# Patient Record
Sex: Female | Born: 1955 | Race: White | Hispanic: No | Marital: Single | Smoking: Current every day smoker
Health system: Southern US, Community
[De-identification: ages and names within clinical notes are randomized; demographics above are authoritative.]

## PROBLEM LIST (undated history)

## (undated) DIAGNOSIS — K219 Gastro-esophageal reflux disease without esophagitis: Secondary | ICD-10-CM

## (undated) DIAGNOSIS — G4730 Sleep apnea, unspecified: Secondary | ICD-10-CM

## (undated) DIAGNOSIS — J449 Chronic obstructive pulmonary disease, unspecified: Secondary | ICD-10-CM

## (undated) DIAGNOSIS — F32A Depression, unspecified: Secondary | ICD-10-CM

## (undated) DIAGNOSIS — Z9884 Bariatric surgery status: Secondary | ICD-10-CM

## (undated) DIAGNOSIS — I2510 Atherosclerotic heart disease of native coronary artery without angina pectoris: Secondary | ICD-10-CM

## (undated) DIAGNOSIS — M48062 Spinal stenosis, lumbar region with neurogenic claudication: Secondary | ICD-10-CM

## (undated) DIAGNOSIS — G2581 Restless legs syndrome: Secondary | ICD-10-CM

## (undated) DIAGNOSIS — D649 Anemia, unspecified: Secondary | ICD-10-CM

## (undated) DIAGNOSIS — G709 Myoneural disorder, unspecified: Secondary | ICD-10-CM

## (undated) DIAGNOSIS — R42 Dizziness and giddiness: Secondary | ICD-10-CM

## (undated) DIAGNOSIS — F419 Anxiety disorder, unspecified: Secondary | ICD-10-CM

## (undated) DIAGNOSIS — T7840XA Allergy, unspecified, initial encounter: Secondary | ICD-10-CM

## (undated) DIAGNOSIS — M1990 Unspecified osteoarthritis, unspecified site: Secondary | ICD-10-CM

## (undated) DIAGNOSIS — Z972 Presence of dental prosthetic device (complete) (partial): Secondary | ICD-10-CM

## (undated) DIAGNOSIS — I10 Essential (primary) hypertension: Secondary | ICD-10-CM

## (undated) HISTORY — PX: GASTRIC BYPASS: SHX52

## (undated) HISTORY — DX: Depression, unspecified: F32.A

## (undated) HISTORY — DX: Myoneural disorder, unspecified: G70.9

## (undated) HISTORY — DX: Anemia, unspecified: D64.9

## (undated) HISTORY — DX: Sleep apnea, unspecified: G47.30

## (undated) HISTORY — PX: TONSILLECTOMY: SHX5217

## (undated) HISTORY — DX: Unspecified osteoarthritis, unspecified site: M19.90

## (undated) HISTORY — PX: TUBAL LIGATION: SHX77

## (undated) HISTORY — PX: FRACTURE SURGERY: SHX138

## (undated) HISTORY — DX: Anxiety disorder, unspecified: F41.9

## (undated) HISTORY — DX: Allergy, unspecified, initial encounter: T78.40XA

---

## 2005-01-21 ENCOUNTER — Ambulatory Visit: Payer: Self-pay | Admitting: Family Medicine

## 2011-07-03 ENCOUNTER — Ambulatory Visit: Payer: Self-pay | Admitting: Podiatry

## 2011-07-10 ENCOUNTER — Ambulatory Visit: Payer: Self-pay | Admitting: Podiatry

## 2011-11-06 ENCOUNTER — Ambulatory Visit: Payer: Self-pay | Admitting: Internal Medicine

## 2012-03-07 ENCOUNTER — Other Ambulatory Visit: Payer: Self-pay | Admitting: Gastroenterology

## 2012-03-07 DIAGNOSIS — R109 Unspecified abdominal pain: Secondary | ICD-10-CM

## 2012-03-07 DIAGNOSIS — R1110 Vomiting, unspecified: Secondary | ICD-10-CM

## 2012-03-12 ENCOUNTER — Encounter (HOSPITAL_COMMUNITY): Payer: Self-pay

## 2012-03-12 ENCOUNTER — Emergency Department (HOSPITAL_COMMUNITY): Payer: 59

## 2012-03-12 ENCOUNTER — Emergency Department (HOSPITAL_COMMUNITY)
Admission: EM | Admit: 2012-03-12 | Discharge: 2012-03-12 | Disposition: A | Discharge status: Home / Self Care | Payer: 59 | Attending: Emergency Medicine | Admitting: Emergency Medicine

## 2012-03-12 DIAGNOSIS — N390 Urinary tract infection, site not specified: Secondary | ICD-10-CM | POA: Insufficient documentation

## 2012-03-12 DIAGNOSIS — Z9884 Bariatric surgery status: Secondary | ICD-10-CM | POA: Insufficient documentation

## 2012-03-12 DIAGNOSIS — R112 Nausea with vomiting, unspecified: Secondary | ICD-10-CM

## 2012-03-12 DIAGNOSIS — R1011 Right upper quadrant pain: Secondary | ICD-10-CM | POA: Insufficient documentation

## 2012-03-12 LAB — URINALYSIS, ROUTINE W REFLEX MICROSCOPIC
Bilirubin Urine: NEGATIVE
Glucose, UA: NEGATIVE mg/dL
Hgb urine dipstick: NEGATIVE
Ketones, ur: NEGATIVE mg/dL
Nitrite: POSITIVE — AB
Protein, ur: NEGATIVE mg/dL
Specific Gravity, Urine: 1.023 (ref 1.005–1.030)
Urobilinogen, UA: 1 mg/dL (ref 0.0–1.0)
pH: 6.5 (ref 5.0–8.0)

## 2012-03-12 LAB — COMPREHENSIVE METABOLIC PANEL
ALT: 14 U/L (ref 0–35)
AST: 18 U/L (ref 0–37)
Albumin: 3.6 g/dL (ref 3.5–5.2)
Alkaline Phosphatase: 94 U/L (ref 39–117)
BUN: 11 mg/dL (ref 6–23)
CO2: 26 mEq/L (ref 19–32)
Calcium: 9 mg/dL (ref 8.4–10.5)
Chloride: 103 mEq/L (ref 96–112)
Creatinine, Ser: 0.62 mg/dL (ref 0.50–1.10)
GFR calc Af Amer: >90 mL/min (ref >90)
GFR calc non Af Amer: >90 mL/min (ref >90)
Glucose, Bld: 97 mg/dL (ref 70–99)
Potassium: 4 mEq/L (ref 3.5–5.1)
Sodium: 140 mEq/L (ref 135–145)
Total Bilirubin: 0.3 mg/dL (ref 0.3–1.2)
Total Protein: 6.7 g/dL (ref 6.0–8.3)

## 2012-03-12 LAB — CBC WITH DIFFERENTIAL/PLATELET
Basophils Absolute: 0 10*3/uL (ref 0.0–0.1)
Basophils Relative: 0 % (ref 0–1)
Eosinophils Absolute: 0.1 10*3/uL (ref 0.0–0.7)
Eosinophils Relative: 1 % (ref 0–5)
HCT: 38.8 % (ref 36.0–46.0)
Hemoglobin: 12.9 g/dL (ref 12.0–15.0)
Lymphocytes Relative: 21 % (ref 12–46)
Lymphs Abs: 1.9 10*3/uL (ref 0.7–4.0)
MCH: 28.8 pg (ref 26.0–34.0)
MCHC: 33.2 g/dL (ref 30.0–36.0)
MCV: 86.6 fL (ref 78.0–100.0)
Monocytes Absolute: 0.4 10*3/uL (ref 0.1–1.0)
Monocytes Relative: 5 % (ref 3–12)
Neutro Abs: 6.7 10*3/uL (ref 1.7–7.7)
Neutrophils Relative %: 73 % (ref 43–77)
Platelets: 248 10*3/uL (ref 150–400)
RBC: 4.48 MIL/uL (ref 3.87–5.11)
RDW: 14.3 % (ref 11.5–15.5)
WBC: 9.1 10*3/uL (ref 4.0–10.5)

## 2012-03-12 LAB — LIPASE, BLOOD: Lipase: 25 U/L (ref 11–59)

## 2012-03-12 LAB — URINE MICROSCOPIC-ADD ON

## 2012-03-12 MED ORDER — NITROFURANTOIN MONOHYD MACRO 100 MG PO CAPS: 100.0000 mg | ORAL_CAPSULE | Freq: Two times a day (BID) | ORAL | Status: AC

## 2012-03-12 MED ORDER — HYDROCODONE-ACETAMINOPHEN 5-325 MG PO TABS: 1.0000 | ORAL_TABLET | ORAL | Status: AC | PRN

## 2012-03-12 MED ORDER — ONDANSETRON 4 MG PO TBDP
8.0000 mg | ORAL_TABLET | Freq: Once | ORAL | Status: AC
  Administered 2012-03-12: 8 mg via ORAL
  Filled 2012-03-12: qty 2

## 2012-03-12 MED ORDER — ONDANSETRON 4 MG PO TBDP: 4.0000 mg | ORAL_TABLET | Freq: Three times a day (TID) | ORAL | Status: AC | PRN

## 2012-03-12 NOTE — ED Provider Notes (Signed)
History     CSN: [id number]  Arrival date & time 03/12/12  0945   First MD Initiated Contact with Patient 03/12/12 [phone]      Chief Complaint  Patient presents with  . Abdominal Pain    (Consider location/radiation/quality/duration/timing/severity/associated sxs/prior treatment) HPI History from patient. 56 year old female who is status post gastric bypass (Roux-en-Y), and cesarean section, presents with abdominal pain. She reports that she has had pain to the right upper quadrant for the past several months. She has been seen by both her PCP and Dr. Loreta Ave of GI for this. She has been scheduled to have both a HIDA scan as well as an abdominal ultrasound on Monday. She was instructed that should she develop vomiting over the weekend, she should proceed to the ED for further evaluation. Patient reports that she awoke this morning and has had approximately 6 episodes of emesis, and is now to the point where she is just "dry heaving." Last bowel movement was yesterday and was reduced in volume. She has been able to pass flatus. Denies fever or chills. She reports that the pain has not significantly worsened. It is described as sharp in nature without known aggravating/alleviating factors.  History reviewed. No pertinent past medical history.  Past Surgical History  Procedure Date  . Gastric bypass   . Cesarean section    bypass performed at [facility], 2011  No family history on file.  History  Substance Use Topics  . Smoking status: Current Everyday Smoker  . Smokeless tobacco: Not on file  . Alcohol Use: No    OB History    Grav Para Term Preterm Abortions TAB SAB Ect Mult Living                  Review of Systems  Constitutional: Positive for appetite change. Negative for fever and chills.  Respiratory: Negative for cough and shortness of breath.   Cardiovascular: Negative for chest pain and palpitations.  Gastrointestinal: Positive for nausea, vomiting and  abdominal pain. Negative for diarrhea, constipation, blood in stool, abdominal distention, anal bleeding and rectal pain.  Genitourinary: Negative for dysuria, vaginal bleeding and vaginal discharge.  All other systems reviewed and are negative.    Allergies  Codeine and Levaquin  Home Medications   Current Outpatient Rx  Name Route Sig Dispense Refill  . ERGOCALCIFEROL 50000 UNITS PO CAPS Oral Take 50,000 Units by mouth 2 (two) times a week.    . FUROSEMIDE 40 MG PO TABS Oral Take 40 mg by mouth 2 (two) times a week.    Marland Kitchen LORAZEPAM 0.5 MG PO TABS Oral Take 0.5 mg by mouth every 8 (eight) hours as needed. anxiety    . TEMAZEPAM 30 MG PO CAPS Oral Take 30 mg by mouth at bedtime.    . TRAMADOL HCL 50 MG PO TABS Oral Take 50 mg by mouth every 6 (six) hours as needed. pain      BP 124/84  Pulse 83  Temp 98.6 F (37 C) (Oral)  Resp 20  SpO2 96%  Physical Exam  Nursing note and vitals reviewed. Constitutional: She appears well-developed and well-nourished. No distress.  HENT:  Head: Normocephalic and atraumatic.  Mouth/Throat: Oropharynx is clear and moist.  Eyes:       Normal appearance  Neck: Normal range of motion.  Cardiovascular: Normal rate, regular rhythm and normal heart sounds.   Pulmonary/Chest: Effort normal and breath sounds normal. She exhibits no tenderness.  Abdominal: Soft. Bowel sounds are  normal.       Tender to palpation without guarding or rebound in the right upper quadrant. Slight generalized tenderness to palpation as well. Normal bowel sounds.  Musculoskeletal: Normal range of motion.  Neurological: She is alert.  Skin: Skin is warm and dry. She is not diaphoretic.  Psychiatric: She has a normal mood and affect.    ED Course  Procedures (including critical care time)  Labs Reviewed  URINALYSIS, ROUTINE W REFLEX MICROSCOPIC - Abnormal; Notable for the following:    APPearance CLOUDY (*)     Nitrite POSITIVE (*)     Leukocytes, UA TRACE (*)      All other components within normal limits  URINE MICROSCOPIC-ADD ON - Abnormal; Notable for the following:    Bacteria, UA MANY (*)     All other components within normal limits  CBC WITH DIFFERENTIAL  COMPREHENSIVE METABOLIC PANEL  LIPASE, BLOOD  URINE CULTURE   US Abdomen Complete  03/12/2012  *RADIOLOGY REPORT*  Clinical Data:  Right upper quadrant pain.  Nausea and vomiting. Previous gastric bypass surgery.  ABDOMINAL ULTRASOUND COMPLETE  Comparison:  None.  Findings:  Gallbladder:  No gallstones, gallbladder wall thickening, or pericholecystic fluid.  Common Bile Duct:  Within normal limits in caliber. Measures 5 mm in diameter.  Liver: No focal mass lesion identified.  Within normal limits in parenchymal echogenicity.  IVC:  Appears normal.  Pancreas:  No abnormality identified.  Spleen:  Within normal limits in size and echotexture.  Right kidney:  Normal in size and parenchymal echogenicity.  No evidence of mass or hydronephrosis.  Left kidney:  Normal in size and parenchymal echogenicity.  No evidence of mass or hydronephrosis.  Abdominal Aorta:  No aneurysm identified.  IMPRESSION: Negative abdominal ultrasound.  Original Report Authenticated By: Danae Orleans, M.D.     1. Urinary tract infection   2. Nausea and vomiting       MDM  Patient presents with nausea and vomiting which started acutely today, accompanied by several weeks to months worth of right upper quadrant pain. Ultrasound does not show evidence of gallbladder pathology or changes with the bile duct. Lab investigation is generally reassuring with the exception of urinary tract infection. This certainly could be the etiology of the patient's nausea and vomiting. She has no true CVA tenderness on exam. Findings discussed with the patient. She was instructed that she still needs to get the HIDA scan as scheduled on Monday. Prescriptions for antibiotic, pain, nausea medicine. Reasons to return  discussed.        Grant Fontana, PA-C 03/12/12 1316

## 2012-03-12 NOTE — ED Notes (Signed)
Pt has an appt for u/s on Monday morning to check gallbladder.  Pt instructed by GI MD if pt began vomiting to go ahead to the ED.

## 2012-03-13 NOTE — ED Provider Notes (Signed)
Medical screening examination/treatment/procedure(s) were performed by non-physician practitioner and as supervising physician I was immediately available for consultation/collaboration.  Klaira Pesci, MD 03/13/12 0719 

## 2012-03-14 ENCOUNTER — Inpatient Hospital Stay (HOSPITAL_COMMUNITY): Admission: RE | Admit: 2012-03-14 | Payer: Self-pay | Source: Ambulatory Visit

## 2012-03-14 ENCOUNTER — Encounter (HOSPITAL_COMMUNITY)
Admission: RE | Admit: 2012-03-14 | Discharge: 2012-03-14 | Disposition: A | Discharge status: Home / Self Care | Payer: 59 | Source: Ambulatory Visit | Attending: Gastroenterology | Admitting: Gastroenterology

## 2012-03-14 DIAGNOSIS — R1110 Vomiting, unspecified: Secondary | ICD-10-CM | POA: Insufficient documentation

## 2012-03-14 DIAGNOSIS — R109 Unspecified abdominal pain: Secondary | ICD-10-CM | POA: Insufficient documentation

## 2012-03-14 MED ORDER — SINCALIDE 5 MCG IJ SOLR
0.0200 ug/kg | Freq: Once | INTRAMUSCULAR | Status: AC
  Administered 2012-03-14: 1.71 ug via INTRAVENOUS

## 2012-03-14 MED ORDER — TECHNETIUM TC 99M MEBROFENIN IV KIT
5.0000 | PACK | Freq: Once | INTRAVENOUS | Status: AC | PRN
  Administered 2012-03-14: 5 via INTRAVENOUS

## 2012-03-14 MED ORDER — SINCALIDE 5 MCG IJ SOLR
INTRAMUSCULAR | Status: AC
  Administered 2012-03-14: 1.71 ug via INTRAVENOUS
  Filled 2012-03-14: qty 5

## 2012-03-15 LAB — URINE CULTURE: Colony Count: >=100000

## 2012-03-16 NOTE — ED Notes (Signed)
+   Urine Patient treated with Cipro-sensitive to same-chart appended per protocol MD. 

## 2013-01-31 ENCOUNTER — Emergency Department: Payer: Self-pay | Admitting: Internal Medicine

## 2014-01-06 ENCOUNTER — Emergency Department: Payer: Self-pay | Admitting: Emergency Medicine

## 2014-03-31 ENCOUNTER — Emergency Department: Payer: Self-pay | Admitting: Emergency Medicine

## 2014-05-21 ENCOUNTER — Emergency Department: Payer: Self-pay | Admitting: Emergency Medicine

## 2014-06-03 ENCOUNTER — Emergency Department: Payer: Self-pay | Admitting: Internal Medicine

## 2015-04-15 ENCOUNTER — Encounter: Payer: Self-pay | Admitting: *Deleted

## 2015-04-15 ENCOUNTER — Emergency Department
Admission: EM | Admit: 2015-04-15 | Discharge: 2015-04-15 | Disposition: A | Discharge status: Home / Self Care | Payer: PRIVATE HEALTH INSURANCE | Attending: Emergency Medicine | Admitting: Emergency Medicine

## 2015-04-15 ENCOUNTER — Emergency Department: Payer: PRIVATE HEALTH INSURANCE

## 2015-04-15 ENCOUNTER — Other Ambulatory Visit: Payer: Self-pay

## 2015-04-15 DIAGNOSIS — Z79899 Other long term (current) drug therapy: Secondary | ICD-10-CM | POA: Insufficient documentation

## 2015-04-15 DIAGNOSIS — I10 Essential (primary) hypertension: Secondary | ICD-10-CM | POA: Insufficient documentation

## 2015-04-15 DIAGNOSIS — M549 Dorsalgia, unspecified: Secondary | ICD-10-CM | POA: Diagnosis not present

## 2015-04-15 DIAGNOSIS — Z720 Tobacco use: Secondary | ICD-10-CM | POA: Insufficient documentation

## 2015-04-15 DIAGNOSIS — R109 Unspecified abdominal pain: Secondary | ICD-10-CM | POA: Diagnosis not present

## 2015-04-15 HISTORY — DX: Essential (primary) hypertension: I10

## 2015-04-15 LAB — COMPREHENSIVE METABOLIC PANEL
ALT: 15 U/L (ref 14–54)
AST: 17 U/L (ref 15–41)
Albumin: 3.9 g/dL (ref 3.5–5.0)
Alkaline Phosphatase: 99 U/L (ref 38–126)
Anion gap: 8 (ref 5–15)
BUN: 9 mg/dL (ref 6–20)
CO2: 28 mmol/L (ref 22–32)
Calcium: 8.8 mg/dL — ABNORMAL LOW (ref 8.9–10.3)
Chloride: 104 mmol/L (ref 101–111)
Creatinine, Ser: 0.6 mg/dL (ref 0.44–1.00)
GFR calc Af Amer: >60 mL/min (ref >60)
GFR calc non Af Amer: >60 mL/min (ref >60)
Glucose, Bld: 90 mg/dL (ref 65–99)
Potassium: 3.7 mmol/L (ref 3.5–5.1)
Sodium: 140 mmol/L (ref 135–145)
Total Bilirubin: 0.4 mg/dL (ref 0.3–1.2)
Total Protein: 7.1 g/dL (ref 6.5–8.1)

## 2015-04-15 LAB — CBC
HCT: 34.8 % — ABNORMAL LOW (ref 35.0–47.0)
Hemoglobin: 11.1 g/dL — ABNORMAL LOW (ref 12.0–16.0)
MCH: 25.5 pg — ABNORMAL LOW (ref 26.0–34.0)
MCHC: 32 g/dL (ref 32.0–36.0)
MCV: 79.8 fL — ABNORMAL LOW (ref 80.0–100.0)
Platelets: 266 10*3/uL (ref 150–440)
RBC: 4.36 MIL/uL (ref 3.80–5.20)
RDW: 16.6 % — ABNORMAL HIGH (ref 11.5–14.5)
WBC: 9.7 10*3/uL (ref 3.6–11.0)

## 2015-04-15 LAB — URINALYSIS COMPLETE WITH MICROSCOPIC (ARMC ONLY)
Bilirubin Urine: NEGATIVE
Glucose, UA: NEGATIVE mg/dL
Hgb urine dipstick: NEGATIVE
Ketones, ur: NEGATIVE mg/dL
Leukocytes, UA: NEGATIVE
Nitrite: NEGATIVE
Protein, ur: NEGATIVE mg/dL
RBC / HPF: NONE SEEN RBC/hpf (ref 0–5)
Specific Gravity, Urine: 1.004 — ABNORMAL LOW (ref 1.005–1.030)
pH: 7 (ref 5.0–8.0)

## 2015-04-15 LAB — LIPASE, BLOOD: Lipase: 23 U/L (ref 22–51)

## 2015-04-15 MED ORDER — TRAMADOL HCL 50 MG PO TABS: 50.0000 mg | ORAL_TABLET | Freq: Four times a day (QID) | ORAL | Status: AC | PRN

## 2015-04-15 MED ORDER — TRAMADOL HCL 50 MG PO TABS
100.0000 mg | ORAL_TABLET | Freq: Once | ORAL | Status: AC
  Administered 2015-04-15: 100 mg via ORAL
  Filled 2015-04-15: qty 2

## 2015-04-15 NOTE — Discharge Instructions (Signed)
Abdominal Pain, Women °Abdominal (stomach, pelvic, or belly) pain can be caused by many things. It is important to tell your doctor: °· The location of the pain. °· Does it come and go or is it present all the time? °· Are there things that start the pain (eating certain foods, exercise)? °· Are there other symptoms associated with the pain (fever, nausea, vomiting, diarrhea)? °All of this is helpful to know when trying to find the cause of the pain. °CAUSES  °· Stomach: virus or bacteria infection, or ulcer. °· Intestine: appendicitis (inflamed appendix), regional ileitis (Crohn's disease), ulcerative colitis (inflamed colon), irritable bowel syndrome, diverticulitis (inflamed diverticulum of the colon), or cancer of the stomach or intestine. °· Gallbladder disease or stones in the gallbladder. °· Kidney disease, kidney stones, or infection. °· Pancreas infection or cancer. °· Fibromyalgia (pain disorder). °· Diseases of the female organs: °¨ Uterus: fibroid (non-cancerous) tumors or infection. °¨ Fallopian tubes: infection or tubal pregnancy. °¨ Ovary: cysts or tumors. °¨ Pelvic adhesions (scar tissue). °¨ Endometriosis (uterus lining tissue growing in the pelvis and on the pelvic organs). °¨ Pelvic congestion syndrome (female organs filling up with blood just before the menstrual period). °¨ Pain with the menstrual period. °¨ Pain with ovulation (producing an egg). °¨ Pain with an IUD (intrauterine device, birth control) in the uterus. °¨ Cancer of the female organs. °· Functional pain (pain not caused by a disease, may improve without treatment). °· Psychological pain. °· Depression. °DIAGNOSIS  °Your doctor will decide the seriousness of your pain by doing an examination. °· Blood tests. °· X-rays. °· Ultrasound. °· CT scan (computed tomography, special type of X-ray). °· MRI (magnetic resonance imaging). °· Cultures, for infection. °· Barium enema (dye inserted in the large intestine, to better view it with  X-rays). °· Colonoscopy (looking in intestine with a lighted tube). °· Laparoscopy (minor surgery, looking in abdomen with a lighted tube). °· Major abdominal exploratory surgery (looking in abdomen with a large incision). °TREATMENT  °The treatment will depend on the cause of the pain.  °· Many cases can be observed and treated at home. °· Over-the-counter medicines recommended by your caregiver. °· Prescription medicine. °· Antibiotics, for infection. °· Birth control pills, for painful periods or for ovulation pain. °· Hormone treatment, for endometriosis. °· Nerve blocking injections. °· Physical therapy. °· Antidepressants. °· Counseling with a psychologist or psychiatrist. °· Minor or major surgery. °HOME CARE INSTRUCTIONS  °· Do not take laxatives, unless directed by your caregiver. °· Take over-the-counter pain medicine only if ordered by your caregiver. Do not take aspirin because it can cause an upset stomach or bleeding. °· Try a clear liquid diet (broth or water) as ordered by your caregiver. Slowly move to a bland diet, as tolerated, if the pain is related to the stomach or intestine. °· Have a thermometer and take your temperature several times a day, and record it. °· Bed rest and sleep, if it helps the pain. °· Avoid sexual intercourse, if it causes pain. °· Avoid stressful situations. °· Keep your follow-up appointments and tests, as your caregiver orders. °· If the pain does not go away with medicine or surgery, you may try: °¨ Acupuncture. °¨ Relaxation exercises (yoga, meditation). °¨ Group therapy. °¨ Counseling. °SEEK MEDICAL CARE IF:  °· You notice certain foods cause stomach pain. °· Your home care treatment is not helping your pain. °· You need stronger pain medicine. °· You want your IUD removed. °· You feel faint or   lightheaded. °· You develop nausea and vomiting. °· You develop a rash. °· You are having side effects or an allergy to your medicine. °SEEK IMMEDIATE MEDICAL CARE IF:  °· Your  pain does not go away or gets worse. °· You have a fever. °· Your pain is felt only in portions of the abdomen. The right side could possibly be appendicitis. The left lower portion of the abdomen could be colitis or diverticulitis. °· You are passing blood in your stools (bright red or black tarry stools, with or without vomiting). °· You have blood in your urine. °· You develop chills, with or without a fever. °· You pass out. °MAKE SURE YOU:  °· Understand these instructions. °· Will watch your condition. °· Will get help right away if you are not doing well or get worse. °Document Released: 06/14/2007 Document Revised: 01/01/2014 Document Reviewed: 07/04/2009 °[employer]® Patient Information ©2015 [employer]. This information is not intended to replace advice given to you by your health care provider. Make sure you discuss any questions you have with your health care provider. ° °

## 2015-04-15 NOTE — ED Notes (Addendum)
Pt reports left sided abdominal pain lower than belly button over the last week with increased pain today, pt denies nausea vomiting, pt complains of left sided back pain

## 2015-04-15 NOTE — ED Provider Notes (Signed)
[facility] Emergency Department Provider Note  Time seen: 2:39 PM  I have reviewed the triage vital signs and the nursing notes.   HISTORY  Chief Complaint Abdominal Pain    HPI Cassandra Brandt is a 59 y.o. female who presents the emergency department with left-sided abdominal pain. According to the patient for the past 4 months she has had left-sided abdominal pain, that has been coming and going, states is much more constant for the past 1 week and is also increased in severity. Currently the patient states it is moderate 7/10, dull/aching in severity. Patient denies any diarrhea, nausea, vomiting, dysuria or hematuria. States normal bowel movements. Saw her primary care doctor for the same back in May, no workup was performed at that time. Patient states it does seem to be somewhat positional, and feels better when she puts pressure on her lower back.     Past Medical History  Diagnosis Date  . Hypertension     There are no active problems to display for this patient.   Past Surgical History  Procedure Laterality Date  . Gastric bypass    . Cesarean section      Current Outpatient Rx  Name  Route  Sig  Dispense  Refill  . ergocalciferol (VITAMIN D2) 50000 UNITS capsule   Oral   Take 50,000 Units by mouth 2 (two) times a week.         . furosemide (LASIX) 40 MG tablet   Oral   Take 40 mg by mouth 2 (two) times a week.         Marland Kitchen LORazepam (ATIVAN) 0.5 MG tablet   Oral   Take 0.5 mg by mouth every 8 (eight) hours as needed. anxiety         . temazepam (RESTORIL) 30 MG capsule   Oral   Take 30 mg by mouth at bedtime.         . traMADol (ULTRAM) 50 MG tablet   Oral   Take 50 mg by mouth every 6 (six) hours as needed. pain           Allergies Codeine and Levaquin  No family history on file.  Social History Social History  Substance Use Topics  . Smoking status: Current Every Day Smoker -- 0.50 packs/day  . Smokeless  tobacco: None  . Alcohol Use: No    Review of Systems Constitutional: Negative for fever. Cardiovascular: Negative for chest pain. Respiratory: Negative for shortness of breath. Gastrointestinal: Positive for left flank pain. Negative for nausea, vomiting, diarrhea. Genitourinary: Negative for dysuria. Negative for hematuria. Musculoskeletal: Positive for left back pain. Neurological: Negative for headache 10-point ROS otherwise negative.  ____________________________________________   PHYSICAL EXAM:  VITAL SIGNS: ED Triage Vitals  Enc Vitals Group     BP 04/15/15 1324 139/83 mmHg     Pulse Rate 04/15/15 1324 97     Resp 04/15/15 1324 18     Temp 04/15/15 1324 98.4 F (36.9 C)     Temp Source 04/15/15 1324 Oral     SpO2 04/15/15 1324 99 %     Weight 04/15/15 1324 198 lb (89.812 kg)     Height 04/15/15 1324  (1.626 m)     Head Cir --      Peak Flow --      Pain Score 04/15/15 1324 8     Pain Loc --      Pain Edu? --      Excl.  in GC? --     Constitutional: Alert and oriented. Well appearing and in no distress. Eyes: Normal exam ENT   Mouth/Throat: Mucous membranes are moist. Cardiovascular: Normal rate, regular rhythm. No murmur Respiratory: Normal respiratory effort without tachypnea nor retractions. Breath sounds are clear and equal bilaterally. No wheezes/rales/rhonchi. Gastrointestinal: Soft, moderate left abdominal tenderness palpation. No rebound or guarding. Moderate left CVA tenderness to palpation. No distention. Musculoskeletal: Nontender with normal range of motion in all extremities. No lower extremity tenderness or edema. Neurologic:  Normal speech and language. No gross focal neurologic deficits  Skin:  Skin is warm, dry and intact.  Psychiatric: Mood and affect are normal. Speech and behavior are normal. Patient exhibits appropriate insight and judgment.  ____________________________________________    EKG  EKG reviewed and interpreted by  myself shows normal sinus rhythm at 88 bpm, narrow QRS, normal axis, normal intervals, no ST changes noted. 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  ____________________________________________    RADIOLOGY  CT within normal limits  ____________________________________________    INITIAL IMPRESSION / ASSESSMENT AND PLAN / ED COURSE  Pertinent labs & imaging results that were available during my care of the patient were reviewed by me and considered in my medical decision making (see chart for details).  Patient with several months of left flank pain, now more constant for the past one week. Denies diarrhea, constipation, nausea/vomiting, dysuria. Denies fever. Labs are within normal limits including urinalysis, we'll proceed with a CT scan of the abdomen and pelvis to further evaluate.  CT abdomen/pelvis within normal limits. We'll discharge patient home with Ultram as needed, follow up with her primary care doctor. Discussed strict abdominal pain return precautions which she is agreeable.  ____________________________________________   FINAL CLINICAL IMPRESSION(S) / ED DIAGNOSES  Left flank pain   Minna Antis, MD 04/15/15 [phone]

## 2015-07-30 ENCOUNTER — Emergency Department
Admission: EM | Admit: 2015-07-30 | Discharge: 2015-07-30 | Disposition: A | Discharge status: Home / Self Care | Payer: Managed Care, Other (non HMO) | Attending: Emergency Medicine | Admitting: Emergency Medicine

## 2015-07-30 ENCOUNTER — Encounter: Payer: Self-pay | Admitting: Emergency Medicine

## 2015-07-30 ENCOUNTER — Emergency Department: Payer: Managed Care, Other (non HMO)

## 2015-07-30 DIAGNOSIS — S161XXA Strain of muscle, fascia and tendon at neck level, initial encounter: Secondary | ICD-10-CM | POA: Diagnosis not present

## 2015-07-30 DIAGNOSIS — W2209XA Striking against other stationary object, initial encounter: Secondary | ICD-10-CM | POA: Insufficient documentation

## 2015-07-30 DIAGNOSIS — S199XXA Unspecified injury of neck, initial encounter: Secondary | ICD-10-CM | POA: Diagnosis present

## 2015-07-30 DIAGNOSIS — Y9389 Activity, other specified: Secondary | ICD-10-CM | POA: Diagnosis not present

## 2015-07-30 DIAGNOSIS — Y9289 Other specified places as the place of occurrence of the external cause: Secondary | ICD-10-CM | POA: Insufficient documentation

## 2015-07-30 DIAGNOSIS — I10 Essential (primary) hypertension: Secondary | ICD-10-CM | POA: Diagnosis not present

## 2015-07-30 DIAGNOSIS — F17200 Nicotine dependence, unspecified, uncomplicated: Secondary | ICD-10-CM | POA: Diagnosis not present

## 2015-07-30 DIAGNOSIS — Y998 Other external cause status: Secondary | ICD-10-CM | POA: Insufficient documentation

## 2015-07-30 DIAGNOSIS — Z79899 Other long term (current) drug therapy: Secondary | ICD-10-CM | POA: Diagnosis not present

## 2015-07-30 MED ORDER — METHOCARBAMOL 500 MG PO TABS: 500.0000 mg | ORAL_TABLET | Freq: Four times a day (QID) | ORAL | Status: DC | PRN

## 2015-07-30 MED ORDER — MELOXICAM 15 MG PO TABS: 15.0000 mg | ORAL_TABLET | Freq: Every day | ORAL | Status: DC

## 2015-07-30 NOTE — Discharge Instructions (Signed)
Cervical Sprain  A cervical sprain is an injury in the neck in which the strong, fibrous tissues (ligaments) that connect your neck bones stretch or tear. Cervical sprains can range from mild to severe. Severe cervical sprains can cause the neck vertebrae to be unstable. This can lead to damage of the spinal cord and can result in serious nervous system problems. The amount of time it takes for a cervical sprain to get better depends on the cause and extent of the injury. Most cervical sprains heal in 1 to 3 weeks.  CAUSES   Severe cervical sprains may be caused by:    Contact sport injuries (such as from football, rugby, wrestling, hockey, auto racing, gymnastics, diving, martial arts, or boxing).    Motor vehicle collisions.    Whiplash injuries. This is an injury from a sudden forward and backward whipping movement of the head and neck.   Falls.   Mild cervical sprains may be caused by:    Being in an awkward position, such as while cradling a telephone between your ear and shoulder.    Sitting in a chair that does not offer proper support.    Working at a poorly designed computer station.    Looking up or down for long periods of time.   SYMPTOMS    Pain, soreness, stiffness, or a burning sensation in the front, back, or sides of the neck. This discomfort may develop immediately after the injury or slowly, 24 hours or more after the injury.    Pain or tenderness directly in the middle of the back of the neck.    Shoulder or upper back pain.    Limited ability to move the neck.    Headache.    Dizziness.    Weakness, numbness, or tingling in the hands or arms.    Muscle spasms.    Difficulty swallowing or chewing.    Tenderness and swelling of the neck.   DIAGNOSIS   Most of the time your health care provider can diagnose a cervical sprain by taking your history and doing a physical exam. Your health care provider will ask about previous neck injuries and any known neck  problems, such as arthritis in the neck. X-rays may be taken to find out if there are any other problems, such as with the bones of the neck. Other tests, such as a CT scan or MRI, may also be needed.   TREATMENT   Treatment depends on the severity of the cervical sprain. Mild sprains can be treated with rest, keeping the neck in place (immobilization), and pain medicines. Severe cervical sprains are immediately immobilized. Further treatment is done to help with pain, muscle spasms, and other symptoms and may include:   Medicines, such as pain relievers, numbing medicines, or muscle relaxants.    Physical therapy. This may involve stretching exercises, strengthening exercises, and posture training. Exercises and improved posture can help stabilize the neck, strengthen muscles, and help stop symptoms from returning.   HOME CARE INSTRUCTIONS    Put ice on the injured area.     Put ice in a plastic bag.     Place a towel between your skin and the bag.     Leave the ice on for 15-20 minutes, 3-4 times a day.    If your injury was severe, you may have been given a cervical collar to wear. A cervical collar is a two-piece collar designed to keep your neck from moving while it heals.      Do not remove the collar unless instructed by your health care provider.    If you have long hair, keep it outside of the collar.    Ask your health care provider before making any adjustments to your collar. Minor adjustments may be required over time to improve comfort and reduce pressure on your chin or on the back of your head.    Ifyou are allowed to remove the collar for cleaning or bathing, follow your health care provider's instructions on how to do so safely.    Keep your collar clean by wiping it with mild soap and water and drying it completely. If the collar you have been given includes removable pads, remove them every 1-2 days and hand wash them with soap and water. Allow them to air dry. They should be completely  dry before you wear them in the collar.    If you are allowed to remove the collar for cleaning and bathing, wash and dry the skin of your neck. Check your skin for irritation or sores. If you see any, tell your health care provider.    Do not drive while wearing the collar.    Only take over-the-counter or prescription medicines for pain, discomfort, or fever as directed by your health care provider.    Keep all follow-up appointments as directed by your health care provider.    Keep all physical therapy appointments as directed by your health care provider.    Make any needed adjustments to your workstation to promote good posture.    Avoid positions and activities that make your symptoms worse.    Warm up and stretch before being active to help prevent problems.   SEEK MEDICAL CARE IF:    Your pain is not controlled with medicine.    You are unable to decrease your pain medicine over time as planned.    Your activity level is not improving as expected.   SEEK IMMEDIATE MEDICAL CARE IF:    You develop any bleeding.   You develop stomach upset.   You have signs of an allergic reaction to your medicine.    Your symptoms get worse.    You develop new, unexplained symptoms.    You have numbness, tingling, weakness, or paralysis in any part of your body.   MAKE SURE YOU:    Understand these instructions.   Will watch your condition.   Will get help right away if you are not doing well or get worse.     This information is not intended to replace advice given to you by your health care provider. Make sure you discuss any questions you have with your health care provider.     Document Released: 06/14/2007 Document Revised: 08/22/2013 Document Reviewed: 02/22/2013  [employer]vier Interactive Patient Education 2016 [employer]
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
## 2015-07-30 NOTE — ED Notes (Signed)
Patient transported to CT 
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
## 2016-01-31 LAB — HM MAMMOGRAPHY

## 2016-03-21 ENCOUNTER — Emergency Department
Admission: EM | Admit: 2016-03-21 | Discharge: 2016-03-21 | Disposition: A | Discharge status: Home / Self Care | Payer: Managed Care, Other (non HMO) | Attending: Emergency Medicine | Admitting: Emergency Medicine

## 2016-03-21 ENCOUNTER — Encounter: Payer: Self-pay | Admitting: Emergency Medicine

## 2016-03-21 DIAGNOSIS — K118 Other diseases of salivary glands: Secondary | ICD-10-CM | POA: Insufficient documentation

## 2016-03-21 DIAGNOSIS — I10 Essential (primary) hypertension: Secondary | ICD-10-CM | POA: Insufficient documentation

## 2016-03-21 DIAGNOSIS — Z79899 Other long term (current) drug therapy: Secondary | ICD-10-CM | POA: Insufficient documentation

## 2016-03-21 DIAGNOSIS — F17200 Nicotine dependence, unspecified, uncomplicated: Secondary | ICD-10-CM | POA: Diagnosis not present

## 2016-03-21 DIAGNOSIS — R220 Localized swelling, mass and lump, head: Secondary | ICD-10-CM | POA: Diagnosis present

## 2016-03-21 MED ORDER — CEPHALEXIN 500 MG PO CAPS: 500.0000 mg | ORAL_CAPSULE | Freq: Four times a day (QID) | ORAL | Status: AC

## 2016-03-21 NOTE — ED Notes (Signed)
Pt presents to ED with reports of left side facial swelling that she noticed this morning. Pt states her mouth is very dry. Pt states she has a slight sore throat. Pt denies fever.

## 2016-03-21 NOTE — ED Provider Notes (Signed)
[facility] Provider Note  ____________________________________________  Time seen: Approximately 3:40 PM  I have reviewed the triage vital signs and the nursing notes.   HISTORY  Chief Complaint Facial Swelling   HPI JULIAUNA STUEVE is a 60 y.o. female presents to the emergency department for evaluation of left side jaw swelling. She stated that upon awakening this morning she felt "normal except having a dry mouth." She states this afternoon while lying down to watch a movie, she noticed that she had some swelling and tenderness to the left lower jaw/upper neck. She denies changes in medications or additions to medications. She denies a previous history of similar symptoms. She states that she has had some sinus drainage but otherwise has been healthy in the recent weeks. She states that she called her physician who is out of town and was advised to come to the emergency department for evaluation. She has not taken any type medication for her current symptoms.  Past Medical History  Diagnosis Date  . Hypertension     There are no active problems to display for this patient.   Past Surgical History  Procedure Laterality Date  . Gastric bypass    . Cesarean section      Current Outpatient Rx  Name  Route  Sig  Dispense  Refill  . cephALEXin (KEFLEX) 500 MG capsule   Oral   Take 1 capsule (500 mg total) by mouth 4 (four) times daily.   40 capsule   0   . ergocalciferol (VITAMIN D2) 50000 UNITS capsule   Oral   Take 50,000 Units by mouth 2 (two) times a week.         . furosemide (LASIX) 40 MG tablet   Oral   Take 40 mg by mouth 2 (two) times a week.         Marland Kitchen LORazepam (ATIVAN) 0.5 MG tablet   Oral   Take 0.5 mg by mouth every 8 (eight) hours as needed. anxiety         . meloxicam (MOBIC) 15 MG tablet   Oral   Take 1 tablet (15 mg total) by mouth daily.   30 tablet   0   . methocarbamol (ROBAXIN) 500 MG tablet  Oral   Take 1 tablet (500 mg total) by mouth every 6 (six) hours as needed for muscle spasms.   30 tablet   0   . temazepam (RESTORIL) 30 MG capsule   Oral   Take 30 mg by mouth at bedtime.         . traMADol (ULTRAM) 50 MG tablet   Oral   Take 1 tablet (50 mg total) by mouth every 6 (six) hours as needed.   20 tablet   0     Allergies Codeine and Levaquin  No family history on file.  Social History Social History  Substance Use Topics  . Smoking status: Current Every Day Smoker -- 0.50 packs/day  . Smokeless tobacco: None  . Alcohol Use: No    Review of Systems  Constitutional: Negative for fever/chills Respiratory: Negative for shortness of breath. ENT: Negative for sore throat, positive for dry mouth, negative for difficulty swallowing, negative for change in voice/hoarseness. Musculoskeletal: Negative for pain. Skin: Negative for wound, lesion, rash. Neurological: Negative for headaches, focal weakness or numbness. ____________________________________________   PHYSICAL EXAM:  VITAL SIGNS: ED Triage Vitals  Enc Vitals Group     BP 03/21/16 1506 120/68 mmHg  Pulse Rate 03/21/16 1506 95     Resp 03/21/16 1506 18     Temp 03/21/16 1506 98.2 F (36.8 C)     Temp src --      SpO2 03/21/16 1506 97 %     Weight 03/21/16 1506 177 lb (80.287 kg)     Height 03/21/16 1506 5\' 4"  (1.626 m)     Head Cir --      Peak Flow --      Pain Score 03/21/16 1505 0     Pain Loc --      Pain Edu? --      Excl. in GC? --      Constitutional: Alert and oriented. Well appearing and in no acute distress. Eyes: Conjunctivae are normal. EOMI. Nose: No congestion/rhinnorhea. Mouth/Throat: Mucous membranes are moist.  Top and bottom dentures. No gingival edema or erythema. Buccal area is soft and without induration. Sublingual area is soft and without induration. Neck: No stridor. Area over the parotid gland swollen. Tender to palpation. Oropharynx normal without erythema  or edema. Tonsils unremarkable. Thyroid unremarkable. Cardiovascular: Good peripheral circulation. Respiratory: Normal respiratory effort.  No retractions. Musculoskeletal: FROM throughout. Neurologic:  Normal speech and language. No gross focal neurologic deficits are appreciated. Skin:  Mild erythema of the left mandible/upper neck without erythema or fluctuance.  ____________________________________________   LABS (all labs ordered are listed, but only abnormal results are displayed)  Labs Reviewed - No data to display ____________________________________________  EKG   ____________________________________________  RADIOLOGY  Not indicated ____________________________________________   PROCEDURES  Procedure(s) performed: None ____________________________________________   INITIAL IMPRESSION / ASSESSMENT AND PLAN / ED COURSE  Pertinent labs & imaging results that were available during my care of the patient were reviewed by me and considered in my medical decision making (see chart for details).  She will be advised to eat lemon candies or tired foods. She was advised to stop any allergy medications that she might be taking. She will take cephalexin 500 mg 4 times a day for the next 10 days. She was advised to follow up with primary care in 2-3 days if not improving.  She was also advised to return to the emergency department for symptoms that change or worsen if unable to schedule an appointment.  ____________________________________________   FINAL CLINICAL IMPRESSION(S) / ED DIAGNOSES  Final diagnoses:  Parotid duct obstruction    New Prescriptions   CEPHALEXIN (KEFLEX) 500 MG CAPSULE    Take 1 capsule (500 mg total) by mouth 4 (four) times daily.     Chinita Pester, FNP 03/21/16 1608  Phineas Semen, MD 03/21/16 1843

## 2016-03-21 NOTE — Discharge Instructions (Signed)
Eat lemon or tart candies often. Avoid taking allergy medications until your symptoms improve.

## 2017-03-20 ENCOUNTER — Emergency Department
Admission: EM | Admit: 2017-03-20 | Discharge: 2017-03-20 | Disposition: A | Discharge status: Home / Self Care | Payer: PRIVATE HEALTH INSURANCE | Attending: Emergency Medicine | Admitting: Emergency Medicine

## 2017-03-20 ENCOUNTER — Encounter: Payer: Self-pay | Admitting: Medical Oncology

## 2017-03-20 DIAGNOSIS — R51 Headache: Secondary | ICD-10-CM | POA: Diagnosis not present

## 2017-03-20 DIAGNOSIS — Z5321 Procedure and treatment not carried out due to patient leaving prior to being seen by health care provider: Secondary | ICD-10-CM | POA: Diagnosis not present

## 2017-03-20 DIAGNOSIS — R112 Nausea with vomiting, unspecified: Secondary | ICD-10-CM | POA: Diagnosis not present

## 2017-03-20 HISTORY — DX: Gastro-esophageal reflux disease without esophagitis: K21.9

## 2017-03-20 LAB — CBC
HCT: 36.1 % (ref 35.0–47.0)
Hemoglobin: 11.7 g/dL — ABNORMAL LOW (ref 12.0–16.0)
MCH: 24.8 pg — ABNORMAL LOW (ref 26.0–34.0)
MCHC: 32.5 g/dL (ref 32.0–36.0)
MCV: 76.5 fL — ABNORMAL LOW (ref 80.0–100.0)
Platelets: 309 10*3/uL (ref 150–440)
RBC: 4.72 MIL/uL (ref 3.80–5.20)
RDW: 18.1 % — ABNORMAL HIGH (ref 11.5–14.5)
WBC: 10.9 10*3/uL (ref 3.6–11.0)

## 2017-03-20 LAB — COMPREHENSIVE METABOLIC PANEL
ALT: 16 U/L (ref 14–54)
AST: 21 U/L (ref 15–41)
Albumin: 4.1 g/dL (ref 3.5–5.0)
Alkaline Phosphatase: 100 U/L (ref 38–126)
Anion gap: 9 (ref 5–15)
BUN: 8 mg/dL (ref 6–20)
CO2: 24 mmol/L (ref 22–32)
Calcium: 9.2 mg/dL (ref 8.9–10.3)
Chloride: 108 mmol/L (ref 101–111)
Creatinine, Ser: 0.65 mg/dL (ref 0.44–1.00)
GFR calc Af Amer: >60 mL/min (ref >60)
GFR calc non Af Amer: >60 mL/min (ref >60)
Glucose, Bld: 98 mg/dL (ref 65–99)
Potassium: 3.4 mmol/L — ABNORMAL LOW (ref 3.5–5.1)
Sodium: 141 mmol/L (ref 135–145)
Total Bilirubin: 0.5 mg/dL (ref 0.3–1.2)
Total Protein: 7.6 g/dL (ref 6.5–8.1)

## 2017-03-20 NOTE — ED Triage Notes (Signed)
Pt reports that she began this am having headache without injury. Pt also reports nausea and vomiting since this am with the pain. Pt A/O x 4, in NAD at this time.

## 2017-03-20 NOTE — ED Notes (Signed)
Called x3 for room, no answer. 

## 2019-11-27 ENCOUNTER — Other Ambulatory Visit: Payer: Self-pay

## 2019-11-27 ENCOUNTER — Encounter: Payer: Self-pay | Admitting: Emergency Medicine

## 2019-11-27 ENCOUNTER — Emergency Department
Admission: EM | Admit: 2019-11-27 | Discharge: 2019-11-27 | Disposition: A | Discharge status: Home / Self Care | Payer: BC Managed Care – PPO | Attending: Emergency Medicine | Admitting: Emergency Medicine

## 2019-11-27 ENCOUNTER — Emergency Department: Payer: BC Managed Care – PPO

## 2019-11-27 DIAGNOSIS — F17210 Nicotine dependence, cigarettes, uncomplicated: Secondary | ICD-10-CM | POA: Insufficient documentation

## 2019-11-27 DIAGNOSIS — S99912A Unspecified injury of left ankle, initial encounter: Secondary | ICD-10-CM | POA: Diagnosis not present

## 2019-11-27 DIAGNOSIS — S93402A Sprain of unspecified ligament of left ankle, initial encounter: Secondary | ICD-10-CM | POA: Insufficient documentation

## 2019-11-27 DIAGNOSIS — S63501A Unspecified sprain of right wrist, initial encounter: Secondary | ICD-10-CM | POA: Insufficient documentation

## 2019-11-27 DIAGNOSIS — S6991XA Unspecified injury of right wrist, hand and finger(s), initial encounter: Secondary | ICD-10-CM | POA: Diagnosis present

## 2019-11-27 DIAGNOSIS — Y999 Unspecified external cause status: Secondary | ICD-10-CM | POA: Diagnosis not present

## 2019-11-27 DIAGNOSIS — W010XXA Fall on same level from slipping, tripping and stumbling without subsequent striking against object, initial encounter: Secondary | ICD-10-CM | POA: Insufficient documentation

## 2019-11-27 DIAGNOSIS — W19XXXA Unspecified fall, initial encounter: Secondary | ICD-10-CM

## 2019-11-27 DIAGNOSIS — Y9389 Activity, other specified: Secondary | ICD-10-CM | POA: Diagnosis not present

## 2019-11-27 DIAGNOSIS — Y929 Unspecified place or not applicable: Secondary | ICD-10-CM | POA: Diagnosis not present

## 2019-11-27 MED ORDER — MELOXICAM 15 MG PO TABS: 15.0000 mg | ORAL_TABLET | Freq: Every day | ORAL | 0 refills | Status: DC

## 2019-11-27 NOTE — ED Provider Notes (Signed)
[facility] Emergency Department Provider Note  ____________________________________________  Time seen: Approximately 5:44 PM  I have reviewed the triage vital signs and the nursing notes.   HISTORY  Chief Complaint Fall    HPI Cassandra Brandt is a 64 y.o. female who presents the emergency department for evaluation of right wrist and left ankle pain.  Patient states that she tripped on a rock this morning causing her foot to twist.  This twisting motion caused her to fall.  She tried to brace her fall with an extended right arm/hand and injured her right wrist as well.  Patient states that she did not believe she had her head or lost consciousness initially.  However she did develop a bruise to the right side of the chin.  She denies any facial pain.  No headaches, visual changes, neck pain, chest pain, shortness of breath no abdominal pain, nausea or vomiting.  Patient states that she is able to hobble on the left ankle but doing so increases the pain.  She is a not able to use or put pressure on the right wrist or hand.  Patient denies any numbness or tingling in the upper or lower extremities.  No back pain.  No medications for his complaint prior to arrival.         Past Medical History:  Diagnosis Date  . GERD (gastroesophageal reflux disease)   . Hypertension     There are no problems to display for this patient.   Past Surgical History:  Procedure Laterality Date  . CESAREAN SECTION    . GASTRIC BYPASS      Prior to Admission medications   Medication Sig Start Date End Date Taking? Authorizing Provider  ergocalciferol (VITAMIN D2) 50000 UNITS capsule Take 50,000 Units by mouth 2 (two) times a week.    [provider]  furosemide (LASIX) 40 MG tablet Take 40 mg by mouth 2 (two) times a week.    [provider]  LORazepam (ATIVAN) 0.5 MG tablet Take 0.5 mg by mouth every 8 (eight) hours as needed. anxiety    [provider]   meloxicam (MOBIC) 15 MG tablet Take 1 tablet (15 mg total) by mouth daily. 11/27/19   Tiara Maultsby, Delorise Royals, PA-C  temazepam (RESTORIL) 30 MG capsule Take 30 mg by mouth at bedtime.    [provider]    Allergies Codeine and Levaquin [levofloxacin in d5w]  History reviewed. No pertinent family history.  Social History Social History   Tobacco Use  . Smoking status: Current Every Day Smoker    Packs/day: 0.50  Substance Use Topics  . Alcohol use: No  . Drug use: No     Review of Systems  Constitutional: No fever/chills Eyes: No visual changes. No discharge ENT: No upper respiratory complaints. Cardiovascular: no chest pain. Respiratory: no cough. No SOB. Gastrointestinal: No abdominal pain.  No nausea, no vomiting.  No diarrhea.  No constipation. Musculoskeletal:R wrist and L ankle pain Skin: Negative for rash, abrasions, lacerations, ecchymosis. Neurological: Negative for headaches, focal weakness or numbness. 10-point ROS otherwise negative.  ____________________________________________   PHYSICAL EXAM:  VITAL SIGNS: ED Triage Vitals  Enc Vitals Group     BP 11/27/19 1641 102/84     Pulse Rate 11/27/19 1641 96     Resp 11/27/19 1641 16     Temp 11/27/19 1641 98.3 F (36.8 C)     Temp Source 11/27/19 1641 Oral     SpO2 11/27/19 1641 97 %  Weight 11/27/19 1642 189 lb (85.7 kg)     Height 11/27/19 1642 5\' 4"  (1.626 m)     Head Circumference --      Peak Flow --      Pain Score 11/27/19 1642 10     Pain Loc --      Pain Edu? --      Excl. in GC? --      Constitutional: Alert and oriented. Well appearing and in no acute distress. Eyes: Conjunctivae are normal. PERRL. EOMI. Head: Small bruise to the right chin.  This measures approximately 1 cm in diameter.  No tenderness to the underlying osseous structures.  No other visible signs of trauma to the head or face. ENT:      Ears:       Nose: No congestion/rhinnorhea.      Mouth/Throat: Mucous  membranes are moist.  Neck: No stridor.  No cervical spine tenderness to palpation.  Cardiovascular: Normal rate, regular rhythm. Normal S1 and S2.  Good peripheral circulation. Respiratory: Normal respiratory effort without tachypnea or retractions. Lungs CTAB. Good air entry to the bases with no decreased or absent breath sounds. Musculoskeletal: Full range of motion to all extremities. No gross deformities appreciated.  Visualization of the right wrist reveals mild edema but no deformity.  Patient is able to flex, extend the wrist, supinate and pronate the forearm.  Patient is very tender to palpation over the distal radius.  No palpable abnormality about the wrist.  Radial pulse intact.  Sensation intact all digits.  Capillary refill intact all digits.  Patient has edema noted about the left ankle.  Patient has diffuse tenderness radiating from the lateral malleolus through the anterior joint line into the proximal metatarsals. Neurologic:  Normal speech and language. No gross focal neurologic deficits are appreciated.  Skin:  Skin is warm, dry and intact. No rash noted. Psychiatric: Mood and affect are normal. Speech and behavior are normal. Patient exhibits appropriate insight and judgement.   ____________________________________________   LABS (all labs ordered are listed, but only abnormal results are displayed)  Labs Reviewed - No data to display ____________________________________________  EKG   ____________________________________________  RADIOLOGY I personally viewed and evaluated these images as part of my medical decision making, as well as reviewing the written report by the radiologist.  DG Wrist Complete Right  Result Date: 11/27/2019 CLINICAL DATA:  Tripped and fell, right wrist pain EXAM: RIGHT WRIST - COMPLETE 3+ VIEW COMPARISON:  None. FINDINGS: Frontal, oblique, and lateral views of the right wrist are obtained. No fracture, subluxation, or dislocation. Joint  spaces are well preserved. Mild negative ulnar variance. Soft tissues are normal. IMPRESSION: 1. No acute displaced fracture. Electronically Signed   By: 11/29/2019 M.D.   On: 11/27/2019 17:17   DG Ankle Complete Left  Result Date: 11/27/2019 CLINICAL DATA:  Tripped and fell, pain EXAM: LEFT ANKLE COMPLETE - 3+ VIEW COMPARISON:  A 115 FINDINGS: Frontal, oblique, and lateral views of the left ankle are obtained. No fracture, subluxation, or dislocation. Mild osteophyte formation of the tibiotalar joint. Mild joint space narrowing within the midfoot. Prominent calcaneal spurs. Mild diffuse soft tissue swelling. IMPRESSION: 1. Soft tissue edema.  No acute fracture. 2. Midfoot and hindfoot osteoarthritis. Electronically Signed   By: 11/29/2019 M.D.   On: 11/27/2019 17:18    ____________________________________________    PROCEDURES  Procedure(s) performed:    Procedures    Medications - No data to display   ____________________________________________  INITIAL IMPRESSION / ASSESSMENT AND PLAN / ED COURSE  Pertinent labs & imaging results that were available during my care of the patient were reviewed by me and considered in my medical decision making (see chart for details).  Review of the [place] CSRS was performed in accordance of the [place] prior to dispensing any controlled drugs.           Patient's diagnosis is consistent with fall, ankle sprain, wrist pain.  Patient presented to the emergency department complaining of sharp left ankle, right wrist pain.  Patient slipped and fell injuring these 2 extremities.  Patient was neurologically vascularly intact.  Imaging reveals no acute fractures or subluxations.  Patient will be treated with Velcro wrist splint, walking boot to the left ankle.  Patient has a prescription for Ultram sent into the pharmacy by her primary care provider.  I will provide anti-inflammatory for further symptom improvement.  Follow-up with orthopedics as  needed..   Patient is given ED precautions to return to the ED for any worsening or new symptoms.     ____________________________________________  FINAL CLINICAL IMPRESSION(S) / ED DIAGNOSES  Final diagnoses:  Fall, initial encounter  Sprain of right wrist, initial encounter  Sprain of left ankle, unspecified ligament, initial encounter      NEW MEDICATIONS STARTED DURING THIS VISIT:  ED Discharge Orders         Ordered    meloxicam (MOBIC) 15 MG tablet  Daily     11/27/19 1802              This chart was dictated using voice recognition software/Dragon. Despite best efforts to proofread, errors can occur which can change the meaning. Any change was purely unintentional.    Darletta Moll, PA-C 11/27/19 1806    Blake Divine, MD 11/28/19 [phone]

## 2019-11-27 NOTE — ED Triage Notes (Signed)
Pt tripped on rock and fell. No blood thinners.  Pain to left ankle and right wrist. Unable to use right hand to drive here r/t pain.  Cannot bear weight on ankle.  No LOC

## 2019-11-27 NOTE — ED Notes (Signed)
See triage note  Presents s/p fall this am  States she tripped on a rock this am  Twisted left ankle   And then right wrist   Good pulses   Swelling noted to left ankle

## 2020-07-25 IMAGING — DX DG WRIST COMPLETE 3+V*R*
4 series · 4 of 4 positions shown · non-contrast
Comparison: None.

CLINICAL DATA: Tripped and fell, right wrist pain

EXAM:
RIGHT WRIST - COMPLETE 3+ VIEW

[wrist ap (1 of 2)]
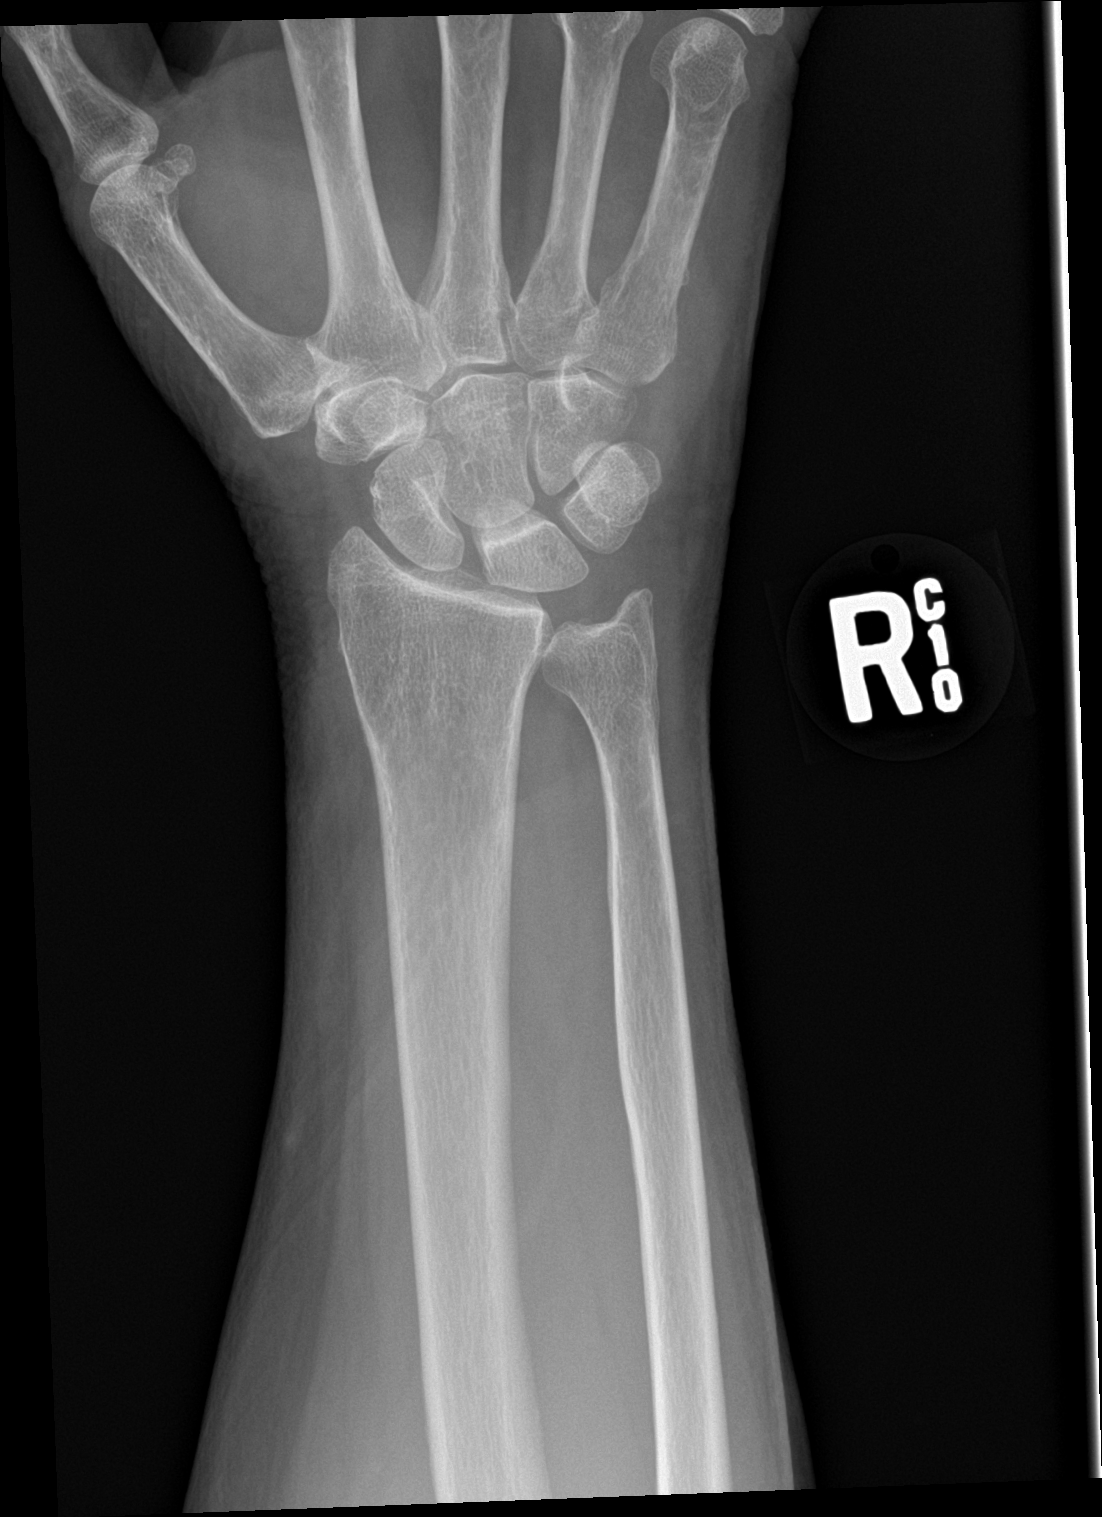

[wrist obl]
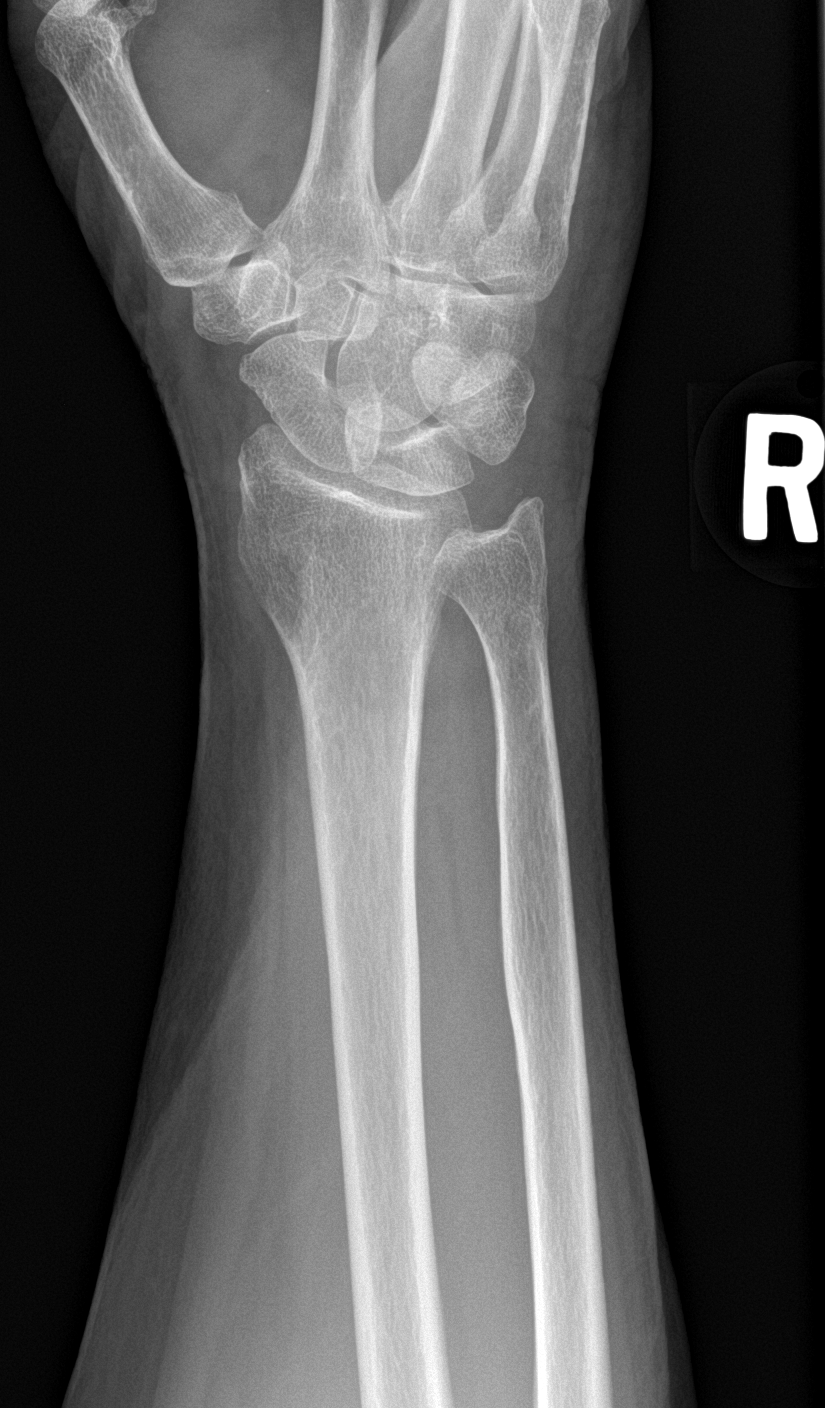

[wrist lat]
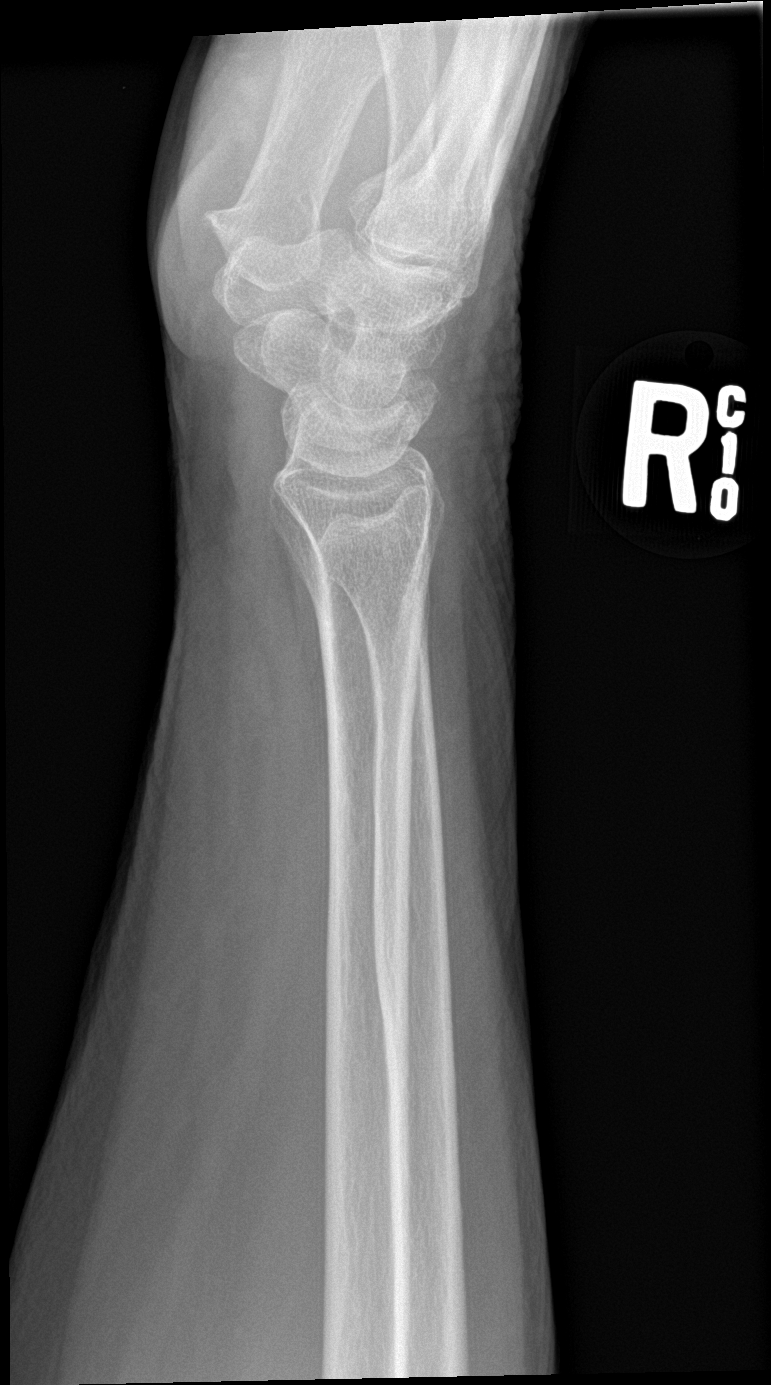

[wrist ap (2 of 2)]
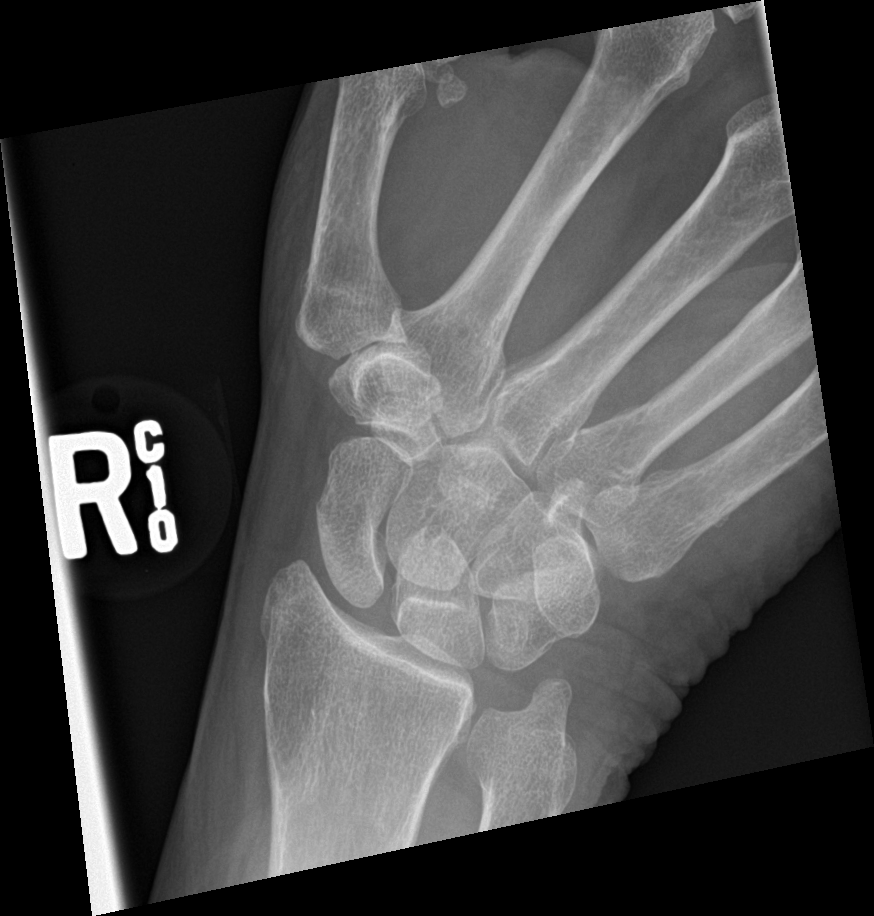

[4 of 4 positions shown; findings below may reference images not displayed]

FINDINGS: Frontal, oblique, and lateral views of the right wrist are obtained.
No fracture, subluxation, or dislocation. Joint spaces are well
preserved. Mild negative ulnar variance. Soft tissues are normal.
IMPRESSION: 1. No acute displaced fracture.

## 2020-10-28 ENCOUNTER — Telehealth: Payer: Self-pay

## 2020-10-28 NOTE — Telephone Encounter (Signed)
Copied from CRM [id number]. Topic: Appointment Scheduling - New Patient >> Oct 22, 2020 10:43 AM Daphine Deutscher D wrote: New patient has been scheduled for your office.  No   Provider: Larae Grooms  Pt has [facility] ppo  She needs a new provider  cb#  [phone]    UNABLE TO LVM TO SCHDULE AS VM NOT SET UP AND OTHER NUMBER DISCONNECTED.

## 2021-01-18 DIAGNOSIS — R350 Frequency of micturition: Secondary | ICD-10-CM | POA: Diagnosis not present

## 2021-01-18 DIAGNOSIS — N390 Urinary tract infection, site not specified: Secondary | ICD-10-CM | POA: Diagnosis not present

## 2021-01-20 DIAGNOSIS — Z833 Family history of diabetes mellitus: Secondary | ICD-10-CM | POA: Diagnosis not present

## 2021-01-20 DIAGNOSIS — Z1231 Encounter for screening mammogram for malignant neoplasm of breast: Secondary | ICD-10-CM | POA: Diagnosis not present

## 2021-01-20 DIAGNOSIS — Z13220 Encounter for screening for lipoid disorders: Secondary | ICD-10-CM | POA: Diagnosis not present

## 2021-01-20 DIAGNOSIS — K219 Gastro-esophageal reflux disease without esophagitis: Secondary | ICD-10-CM | POA: Diagnosis not present

## 2021-01-20 DIAGNOSIS — Z9884 Bariatric surgery status: Secondary | ICD-10-CM | POA: Diagnosis not present

## 2021-01-20 DIAGNOSIS — F5101 Primary insomnia: Secondary | ICD-10-CM | POA: Diagnosis not present

## 2021-01-21 DIAGNOSIS — F5101 Primary insomnia: Secondary | ICD-10-CM | POA: Insufficient documentation

## 2021-01-21 DIAGNOSIS — K219 Gastro-esophageal reflux disease without esophagitis: Secondary | ICD-10-CM | POA: Insufficient documentation

## 2021-01-21 DIAGNOSIS — G2581 Restless legs syndrome: Secondary | ICD-10-CM | POA: Insufficient documentation

## 2021-01-21 DIAGNOSIS — Z9884 Bariatric surgery status: Secondary | ICD-10-CM | POA: Insufficient documentation

## 2021-01-30 DIAGNOSIS — I10 Essential (primary) hypertension: Secondary | ICD-10-CM | POA: Insufficient documentation

## 2021-01-30 DIAGNOSIS — Z720 Tobacco use: Secondary | ICD-10-CM | POA: Insufficient documentation

## 2021-01-30 DIAGNOSIS — J449 Chronic obstructive pulmonary disease, unspecified: Secondary | ICD-10-CM | POA: Insufficient documentation

## 2021-01-30 DIAGNOSIS — F419 Anxiety disorder, unspecified: Secondary | ICD-10-CM | POA: Insufficient documentation

## 2021-01-30 DIAGNOSIS — M169 Osteoarthritis of hip, unspecified: Secondary | ICD-10-CM | POA: Insufficient documentation

## 2021-01-30 DIAGNOSIS — E559 Vitamin D deficiency, unspecified: Secondary | ICD-10-CM | POA: Insufficient documentation

## 2021-04-28 DIAGNOSIS — D508 Other iron deficiency anemias: Secondary | ICD-10-CM | POA: Diagnosis not present

## 2021-04-28 DIAGNOSIS — G2581 Restless legs syndrome: Secondary | ICD-10-CM | POA: Diagnosis not present

## 2021-04-28 DIAGNOSIS — D510 Vitamin B12 deficiency anemia due to intrinsic factor deficiency: Secondary | ICD-10-CM | POA: Diagnosis not present

## 2021-04-28 DIAGNOSIS — D509 Iron deficiency anemia, unspecified: Secondary | ICD-10-CM | POA: Insufficient documentation

## 2021-04-28 DIAGNOSIS — F5101 Primary insomnia: Secondary | ICD-10-CM | POA: Diagnosis not present

## 2021-12-19 DIAGNOSIS — G2581 Restless legs syndrome: Secondary | ICD-10-CM | POA: Diagnosis not present

## 2021-12-19 DIAGNOSIS — E538 Deficiency of other specified B group vitamins: Secondary | ICD-10-CM | POA: Diagnosis not present

## 2021-12-19 DIAGNOSIS — K219 Gastro-esophageal reflux disease without esophagitis: Secondary | ICD-10-CM | POA: Diagnosis not present

## 2021-12-19 DIAGNOSIS — D508 Other iron deficiency anemias: Secondary | ICD-10-CM | POA: Diagnosis not present

## 2021-12-19 DIAGNOSIS — G4719 Other hypersomnia: Secondary | ICD-10-CM | POA: Diagnosis not present

## 2021-12-26 DIAGNOSIS — D72825 Bandemia: Secondary | ICD-10-CM | POA: Diagnosis not present

## 2022-06-29 ENCOUNTER — Encounter: Payer: Self-pay | Admitting: Nurse Practitioner

## 2022-06-29 ENCOUNTER — Ambulatory Visit: Payer: BC Managed Care – PPO | Admitting: Nurse Practitioner

## 2022-06-29 VITALS — BP 142/78 | HR 95 | Temp 98.9°F | Ht 63.5 in | Wt 207.0 lb

## 2022-06-29 DIAGNOSIS — Z7689 Persons encountering health services in other specified circumstances: Secondary | ICD-10-CM

## 2022-06-29 DIAGNOSIS — F5101 Primary insomnia: Secondary | ICD-10-CM

## 2022-06-29 DIAGNOSIS — I10 Essential (primary) hypertension: Secondary | ICD-10-CM | POA: Diagnosis not present

## 2022-06-29 DIAGNOSIS — Z780 Asymptomatic menopausal state: Secondary | ICD-10-CM | POA: Diagnosis not present

## 2022-06-29 DIAGNOSIS — Z1231 Encounter for screening mammogram for malignant neoplasm of breast: Secondary | ICD-10-CM

## 2022-06-29 DIAGNOSIS — F419 Anxiety disorder, unspecified: Secondary | ICD-10-CM

## 2022-06-29 DIAGNOSIS — J449 Chronic obstructive pulmonary disease, unspecified: Secondary | ICD-10-CM

## 2022-06-29 MED ORDER — CITALOPRAM HYDROBROMIDE 10 MG PO TABS: 10.0000 mg | ORAL_TABLET | Freq: Every day | ORAL | 0 refills | Status: DC

## 2022-06-29 MED ORDER — TEMAZEPAM 30 MG PO CAPS: 30.0000 mg | ORAL_CAPSULE | Freq: Every day | ORAL | 0 refills | Status: DC

## 2022-06-29 NOTE — Assessment & Plan Note (Signed)
Chronic. Not well controlled.  Will start Celexa 10mg .  Side effects and benefits of medication discussed during visit.  Patient has been on this medication many years ago and did well with it.  Follow up in 2 weeks for reevaluation.  Call sooner if concerns arise.

## 2022-06-29 NOTE — Progress Notes (Unsigned)
BP (!) 142/78   Pulse 95   Temp 98.9 F (37.2 C) (Oral)   Ht 5' 3.5" (1.613 m)   Wt 207 lb (93.9 kg)   SpO2 95%   BMI 36.09 kg/m    Subjective:    Patient ID: Cassandra Brandt, female    DOB: 23-Mar-1956, 66 y.o.   MRN: [id number]  HPI: Cassandra Brandt is a 66 y.o. female  Chief Complaint  Patient presents with   Establish Care    Patient would like to discuss insomnia and panic attacks . States her most recent one was yesterday   Patient presents to clinic to establish care with new PCP.  Introduced to Publishing rights manager role and practice setting.  All questions answered.  Discussed provider/patient relationship and expectations.  Patient states she hasn't had a PCP for a long while.  She is retiring on December 31.  Patient reports a history of Hypertension, Anxiety, Depression, COPD- never been on medication.  Current everyday smoker.  Smokes about 1/2ppd for about 50 years. Has history of gastric by pass in 2005.   Patient states she took Lisinopril in the past but it made her lips swell up. She hasn't been on any medication for a long time.    Patient denies a history of: Elevated Cholesterol, Diabetes, Thyroid problems, Neurological problems, and Abdominal problems.   She was previously seeing a provider who was doing house calls and they were prescribing her Temazepam, ativan and previously on adderall and they were prescribing that as well.    INSOMNIA Duration: years Satisfied with sleep quality: no Difficulty falling asleep: no Difficulty staying asleep: yes Waking a few hours after sleep onset: yes Early morning awakenings: yes Daytime hypersomnolence: no Wakes feeling refreshed: no Good sleep hygiene: yes Apnea: yes Snoring:  unsure Depressed/anxious mood: yes Recent stress: yes Restless legs/nocturnal leg cramps: no Chronic pain/arthritis: yes History of sleep study: yes- 2005 Treatments attempted:  Trazodone and  Temazepam  DEPRESSION/ANXIETY Duration:uncontrolled Anxious mood: yes  Excessive worrying: yes Irritability: no  Sweating:  sometimes Nausea: no Palpitations: when she has a panic attack Hyperventilation: no Panic attacks: yes Agoraphobia: no  Obscessions/compulsions: no Depressed mood: yes    06/29/2022    3:39 PM  Depression screen PHQ 2/9  Decreased Interest 2  Down, Depressed, Hopeless 3  PHQ - 2 Score 5  Altered sleeping 3  Tired, decreased energy 2  Change in appetite 1  Feeling bad or failure about yourself  3  Trouble concentrating 1  Moving slowly or fidgety/restless 1  Suicidal thoughts 0  PHQ-9 Score 16  Difficult doing work/chores Not difficult at all   Anhedonia: no Weight changes: no Insomnia: yes hard to stay asleep  Hypersomnia: no Fatigue/loss of energy: yes Feelings of worthlessness: no Feelings of guilt: no Impaired concentration/indecisiveness: no Suicidal ideations: no  Crying spells: no Recent Stressors/Life Changes: yes   Relationship problems: no   Family stress: no     Financial stress: no    Job stress: yes    Recent death/loss: no     Active Ambulatory Problems    Diagnosis Date Noted   Anxiety 01/30/2021   Chronic obstructive pulmonary disease, unspecified (HCC) 01/30/2021   Essential hypertension 01/30/2021   Gastroesophageal reflux disease without esophagitis 01/21/2021   H/O gastric bypass 01/21/2021   Iron deficiency anemia 04/28/2021   Primary insomnia 01/21/2021   RLS (restless legs syndrome) 01/21/2021   Vitamin D deficiency 01/30/2021   Resolved Ambulatory Problems  Diagnosis Date Noted   No Resolved Ambulatory Problems   Past Medical History:  Diagnosis Date   GERD (gastroesophageal reflux disease)    Hypertension    Past Surgical History:  Procedure Laterality Date   CESAREAN SECTION     GASTRIC BYPASS     History reviewed. No pertinent family history.   Review of Systems  Psychiatric/Behavioral:   Positive for dysphoric mood and sleep disturbance. Negative for decreased concentration, self-injury and suicidal ideas. The patient is nervous/anxious.     Per HPI unless specifically indicated above     Objective:    BP (!) 142/78   Pulse 95   Temp 98.9 F (37.2 C) (Oral)   Ht 5' 3.5" (1.613 m)   Wt 207 lb (93.9 kg)   SpO2 95%   BMI 36.09 kg/m   Wt Readings from Last 3 Encounters:  06/29/22 207 lb (93.9 kg)  11/27/19 189 lb (85.7 kg)  03/20/17 177 lb (80.3 kg)    Physical Exam Vitals and nursing note reviewed.  Constitutional:      General: She is not in acute distress.    Appearance: Normal appearance. She is obese. She is not ill-appearing, toxic-appearing or diaphoretic.  HENT:     Head: Normocephalic.     Right Ear: External ear normal.     Left Ear: External ear normal.     Nose: Nose normal.     Mouth/Throat:     Mouth: Mucous membranes are moist.     Pharynx: Oropharynx is clear.  Eyes:     General:        Right eye: No discharge.        Left eye: No discharge.     Extraocular Movements: Extraocular movements intact.     Conjunctiva/sclera: Conjunctivae normal.     Pupils: Pupils are equal, round, and reactive to light.  Cardiovascular:     Rate and Rhythm: Normal rate and regular rhythm.     Heart sounds: No murmur heard. Pulmonary:     Effort: Pulmonary effort is normal. No respiratory distress.     Breath sounds: Normal breath sounds. No wheezing or rales.  Musculoskeletal:     Cervical back: Normal range of motion and neck supple.  Skin:    General: Skin is warm and dry.     Capillary Refill: Capillary refill takes less than 2 seconds.  Neurological:     General: No focal deficit present.     Mental Status: She is alert and oriented to person, place, and time. Mental status is at baseline.  Psychiatric:        Mood and Affect: Mood normal.        Behavior: Behavior normal.        Thought Content: Thought content normal.        Judgment:  Judgment normal.     Results for orders placed or performed during the hospital encounter of 03/20/17  Comprehensive metabolic panel  Result Value Ref Range   Sodium 141 135 - 145 mmol/L   Potassium 3.4 (L) 3.5 - 5.1 mmol/L   Chloride 108 101 - 111 mmol/L   CO2 24 22 - 32 mmol/L   Glucose, Bld 98 65 - 99 mg/dL   BUN 8 6 - 20 mg/dL   Creatinine, Ser 4.48 0.44 - 1.00 mg/dL   Calcium 9.2 8.9 - 18.5 mg/dL   Total Protein 7.6 6.5 - 8.1 g/dL   Albumin 4.1 3.5 - 5.0 g/dL   AST 21  15 - 41 U/L   ALT 16 14 - 54 U/L   Alkaline Phosphatase 100 38 - 126 U/L   Total Bilirubin 0.5 0.3 - 1.2 mg/dL   GFR calc non Af Amer >60 >60 mL/min   GFR calc Af Amer >60 >60 mL/min   Anion gap 9 5 - 15  CBC  Result Value Ref Range   WBC 10.9 3.6 - 11.0 K/uL   RBC 4.72 3.80 - 5.20 MIL/uL   Hemoglobin 11.7 (L) 12.0 - 16.0 g/dL   HCT 36.1 35.0 - 47.0 %   MCV 76.5 (L) 80.0 - 100.0 fL   MCH 24.8 (L) 26.0 - 34.0 pg   MCHC 32.5 32.0 - 36.0 g/dL   RDW 18.1 (H) 11.5 - 14.5 %   Platelets 309 150 - 440 K/uL      Assessment & Plan:   Problem List Items Addressed This Visit       Cardiovascular and Mediastinum   Essential hypertension    Chronic. Not well controlled.  Will consider starting Amlodipine at next visit.  Patient had angioedema with Lisinopril in the past. Not currently taking any medication.  She will return in 2 weeks for reevaluation.        Respiratory   Chronic obstructive pulmonary disease, unspecified (HCC)    Chronic.  Controlled.  Continue with current medication regimen.  Will bring record of Pneumonia shot to next visit.  Will benefit from Spirometry in the future.  Follow up in 2 weeks.  Call sooner if concerns arise.          Other   Anxiety    Chronic. Not well controlled.  Will start Celexa 10mg .  Side effects and benefits of medication discussed during visit.  Patient has been on this medication many years ago and did well with it.  Follow up in 2 weeks for reevaluation.   Call sooner if concerns arise.       Relevant Medications   citalopram (CELEXA) 10 MG tablet   Primary insomnia    Chronic. Has been on Temazepam for many years.  Trazodone did not work for her.  Needs an updated sleep study.  Discussed side effects and potential adverse effects of taking Temazepam.  Patient understands and is okay with continuing.  Refill sent today.  Follow up in 2 weeks.       Other Visit Diagnoses     Encounter to establish care    -  Primary   Post-menopausal       Relevant Orders   DG Bone Density   Encounter for screening mammogram for malignant neoplasm of breast       Relevant Orders   MM 3D SCREEN BREAST BILATERAL        Follow up plan: Return in about 2 weeks (around 07/13/2022) for Medication Management.

## 2022-06-29 NOTE — Assessment & Plan Note (Signed)
Chronic. Has been on Temazepam for many years.  Trazodone did not work for her.  Needs an updated sleep study.  Discussed side effects and potential adverse effects of taking Temazepam.  Patient understands and is okay with continuing.  Refill sent today.  Follow up in 2 weeks.

## 2022-06-29 NOTE — Assessment & Plan Note (Signed)
Chronic. Not well controlled.  Will consider starting Amlodipine at next visit.  Patient had angioedema with Lisinopril in the past. Not currently taking any medication.  She will return in 2 weeks for reevaluation.

## 2022-06-29 NOTE — Assessment & Plan Note (Signed)
Chronic.  Controlled.  Continue with current medication regimen.  Will bring record of Pneumonia shot to next visit.  Will benefit from Spirometry in the future.  Follow up in 2 weeks.  Call sooner if concerns arise.

## 2022-07-01 ENCOUNTER — Telehealth: Payer: Self-pay

## 2022-07-01 NOTE — Telephone Encounter (Signed)
-----   Message from Jon Billings, NP sent at 06/29/2022  4:07 PM EDT ----- Off November 10th and 13th.  If not one of those days whatever day you can get just try for the latest appt.  This is for her Mammogram and Dexa lol

## 2022-07-01 NOTE — Telephone Encounter (Signed)
Called patient to inform her of mammogram and dexa scan scheduled at [facility] in [place] for 09/10/2022 at 9:20 AM. Patient has verbalized understanding

## 2022-07-01 NOTE — Telephone Encounter (Signed)
Entered in error

## 2022-07-13 ENCOUNTER — Encounter: Payer: Self-pay | Admitting: Nurse Practitioner

## 2022-07-13 ENCOUNTER — Ambulatory Visit: Payer: BC Managed Care – PPO | Admitting: Nurse Practitioner

## 2022-07-13 VITALS — BP 138/94 | HR 88 | Temp 98.9°F | Wt 206.3 lb

## 2022-07-13 DIAGNOSIS — F5101 Primary insomnia: Secondary | ICD-10-CM | POA: Diagnosis not present

## 2022-07-13 MED ORDER — BELSOMRA 10 MG PO TABS: 10.0000 mg | ORAL_TABLET | Freq: Every evening | ORAL | 0 refills | Status: DC | PRN

## 2022-07-13 NOTE — Assessment & Plan Note (Signed)
Chronic. Not well controlled. Only sleeping 3-3.5 hours nightly.  Has tried Temazepam, Ambien and Trazodone.  Has had adverse reactions to Ambien and Trazodone.  Temazepam not help with sleep.  Will change to Belsomra.  Side effects and benefits of medication discussed during visit.  Follow up in 1 month.

## 2022-07-13 NOTE — Progress Notes (Signed)
BP (!) 138/94 (BP Location: Right Arm, Cuff Size: Normal)   Pulse 88   Temp 98.9 F (37.2 C) (Oral)   Wt 206 lb 4.8 oz (93.6 kg)   SpO2 98%   BMI 35.97 kg/m    Subjective:    Patient ID: Cassandra Brandt, female    DOB: 1955/10/24, 66 y.o.   MRN: [id number]  HPI: Cassandra Brandt is a 66 y.o. female  Chief Complaint  Patient presents with   Medication Management     2 week follow up on celexa. Patient reports sleep has not improved since last office visit.     INSOMNIA Patient states her sleep hasn't improved.  She falls asleep quickly but then 3-3.5 hours later she is up. She has tried Ambien and Trazodone which she had adverse reactions to.  Duration: years Satisfied with sleep quality: no Difficulty falling asleep: no Difficulty staying asleep: yes Waking a few hours after sleep onset: yes Early morning awakenings: yes Daytime hypersomnolence: no Wakes feeling refreshed: no Good sleep hygiene: yes Apnea: yes Snoring:  unsure Depressed/anxious mood: yes Recent stress: yes Restless legs/nocturnal leg cramps: no Chronic pain/arthritis: yes History of sleep study: yes- 2005 Treatments attempted:  Trazodone and Temazepam  DEPRESSION/ANXIETY Patient states she is tolerating the Celexa well.  Feels like she is 70% better.  Work has not been aggravating her as much.  Duration:improved Anxious mood: yes  Excessive worrying: yes Irritability: no  Sweating:  sometimes Nausea: no Palpitations: when she has a panic attack Hyperventilation: no Panic attacks: yes Agoraphobia: no  Obscessions/compulsions: no Depressed mood: yes    07/13/2022    2:23 PM 06/29/2022    3:39 PM  Depression screen PHQ 2/9  Decreased Interest 1 2  Down, Depressed, Hopeless 0 3  PHQ - 2 Score 1 5  Altered sleeping 2 3  Tired, decreased energy 1 2  Change in appetite 0 1  Feeling bad or failure about yourself  0 3  Trouble concentrating 0 1  Moving slowly or fidgety/restless 0 1  Suicidal  thoughts 0 0  PHQ-9 Score 4 16  Difficult doing work/chores Not difficult at all Not difficult at all   Anhedonia: no Weight changes: no Insomnia: yes hard to stay asleep  Hypersomnia: no Fatigue/loss of energy: yes Feelings of worthlessness: no Feelings of guilt: no Impaired concentration/indecisiveness: no Suicidal ideations: no  Crying spells: no Recent Stressors/Life Changes: yes   Relationship problems: no   Family stress: no     Financial stress: no    Job stress: yes    Recent death/loss: no     Active Ambulatory Problems    Diagnosis Date Noted   Anxiety 01/30/2021   Chronic obstructive pulmonary disease, unspecified (HCC) 01/30/2021   Essential hypertension 01/30/2021   Gastroesophageal reflux disease without esophagitis 01/21/2021   H/O gastric bypass 01/21/2021   Iron deficiency anemia 04/28/2021   Primary insomnia 01/21/2021   RLS (restless legs syndrome) 01/21/2021   Vitamin D deficiency 01/30/2021   Resolved Ambulatory Problems    Diagnosis Date Noted   No Resolved Ambulatory Problems   Past Medical History:  Diagnosis Date   GERD (gastroesophageal reflux disease)    Hypertension    Past Surgical History:  Procedure Laterality Date   CESAREAN SECTION     GASTRIC BYPASS     History reviewed. No pertinent family history.   Review of Systems  Psychiatric/Behavioral:  Positive for dysphoric mood and sleep disturbance. Negative for decreased concentration, self-injury  and suicidal ideas. The patient is nervous/anxious.     Per HPI unless specifically indicated above     Objective:    BP (!) 138/94 (BP Location: Right Arm, Cuff Size: Normal)   Pulse 88   Temp 98.9 F (37.2 C) (Oral)   Wt 206 lb 4.8 oz (93.6 kg)   SpO2 98%   BMI 35.97 kg/m   Wt Readings from Last 3 Encounters:  07/13/22 206 lb 4.8 oz (93.6 kg)  06/29/22 207 lb (93.9 kg)  11/27/19 189 lb (85.7 kg)    Physical Exam Vitals and nursing note reviewed.  Constitutional:       General: She is not in acute distress.    Appearance: Normal appearance. She is obese. She is not ill-appearing, toxic-appearing or diaphoretic.  HENT:     Head: Normocephalic.     Right Ear: External ear normal.     Left Ear: External ear normal.     Nose: Nose normal.     Mouth/Throat:     Mouth: Mucous membranes are moist.     Pharynx: Oropharynx is clear.  Eyes:     General:        Right eye: No discharge.        Left eye: No discharge.     Extraocular Movements: Extraocular movements intact.     Conjunctiva/sclera: Conjunctivae normal.     Pupils: Pupils are equal, round, and reactive to light.  Cardiovascular:     Rate and Rhythm: Normal rate and regular rhythm.     Heart sounds: No murmur heard. Pulmonary:     Effort: Pulmonary effort is normal. No respiratory distress.     Breath sounds: Normal breath sounds. No wheezing or rales.  Musculoskeletal:     Cervical back: Normal range of motion and neck supple.  Skin:    General: Skin is warm and dry.     Capillary Refill: Capillary refill takes less than 2 seconds.  Neurological:     General: No focal deficit present.     Mental Status: She is alert and oriented to person, place, and time. Mental status is at baseline.  Psychiatric:        Mood and Affect: Mood normal.        Behavior: Behavior normal.        Thought Content: Thought content normal.        Judgment: Judgment normal.     Results for orders placed or performed in visit on 06/29/22  HM MAMMOGRAPHY  Result Value Ref Range   HM Mammogram 0-4 Bi-Rad 0-4 Bi-Rad, Self Reported Normal      Assessment & Plan:   Problem List Items Addressed This Visit       Other   Primary insomnia - Primary    Chronic. Not well controlled. Only sleeping 3-3.5 hours nightly.  Has tried Temazepam, Ambien and Trazodone.  Has had adverse reactions to Ambien and Trazodone.  Temazepam not help with sleep.  Will change to Belsomra.  Side effects and benefits of medication  discussed during visit.  Follow up in 1 month.        Follow up plan: Return in about 1 month (around 08/12/2022) for Physical and Fasting labs.

## 2022-07-27 ENCOUNTER — Encounter: Payer: Self-pay | Admitting: Nurse Practitioner

## 2022-07-27 MED ORDER — CITALOPRAM HYDROBROMIDE 10 MG PO TABS: 10.0000 mg | ORAL_TABLET | Freq: Every day | ORAL | 0 refills | Status: DC

## 2022-08-11 NOTE — Progress Notes (Unsigned)
There were no vitals taken for this visit.   Subjective:    Patient ID: Cassandra Brandt, female    DOB: [birth year], 66 y.o.   MRN: [id number]  HPI: Cassandra Brandt is a 66 y.o. female presenting on 08/12/2022 for comprehensive medical examination. Current medical complaints include:{Blank single:19197::"none","***"}  She currently lives with: Menopausal Symptoms: {Blank single:19197::"yes","no"}  HYPERTENSION {Blank single:19197::"without","with"} Chronic Kidney Disease Hypertension status: {Blank single:19197::"controlled","uncontrolled","better","worse","exacerbated","stable"}  Satisfied with current treatment? {Blank single:19197::"yes","no"} Duration of hypertension: {Blank single:19197::"chronic","months","years"} BP monitoring frequency:  {Blank single:19197::"not checking","rarely","daily","weekly","monthly","a few times a day","a few times a week","a few times a month"} BP range:  BP medication side effects:  {Blank single:19197::"yes","no"} Medication compliance: {Blank single:19197::"excellent compliance","good compliance","fair compliance","poor compliance"} Previous BP meds:{Blank [id number] (bystolic)","carvedilol","chlorthalidone","clonidine","diltiazem","exforge HCT","HCTZ","irbesartan (avapro)","labetalol","lisinopril","lisinopril-HCTZ","losartan (cozaar)","methyldopa","nifedipine","olmesartan (benicar)","olmesartan-HCTZ","quinapril","ramipril","spironalactone","tekturna","valsartan","valsartan-HCTZ","verapamil"} Aspirin: {Blank single:19197::"yes","no"} Recurrent headaches: {Blank single:19197::"yes","no"} Visual changes: {Blank single:19197::"yes","no"} Palpitations: {Blank single:19197::"yes","no"} Dyspnea: {Blank single:19197::"yes","no"} Chest pain: {Blank single:19197::"yes","no"} Lower extremity edema: {Blank single:19197::"yes","no"} Dizzy/lightheaded: {Blank  single:19197::"yes","no"}  COPD COPD status: {Blank single:19197::"controlled","uncontrolled","better","worse","exacerbated","stable"} Satisfied with current treatment?: {Blank single:19197::"yes","no"} Oxygen use: {Blank single:19197::"yes","no"} Dyspnea frequency:  Cough frequency:  Rescue inhaler frequency:   Limitation of activity: {Blank single:19197::"yes","no"} Productive cough:  Last Spirometry:  Pneumovax: {Blank single:19197::"Up to Date","Not up to Date","unknown"} Influenza: {Blank single:19197::"Up to Date","Not up to Date","unknown"}  ANEMIA Anemia status: {Blank single:19197::"controlled","uncontrolled","better","worse","exacerbated","stable"} Etiology of anemia: Duration of anemia treatment:  Compliance with treatment: {Blank single:19197::"excellent compliance","good compliance","fair compliance","poor compliance"} Iron supplementation side effects: {Blank single:19197::"yes","no"} Severity of anemia: {Blank single:19197::"mild","moderate","severe"} Fatigue: {Blank single:19197::"yes","no"} Decreased exercise tolerance: {Blank single:19197::"yes","no"}  Dyspnea on exertion: {Blank single:19197::"yes","no"} Palpitations: {Blank single:19197::"yes","no"} Bleeding: {Blank single:19197::"yes","no"} Pica: {Blank single:19197::"yes","no"}  Depression Screen done today and results listed below:     07/13/2022    2:23 PM 06/29/2022    3:39 PM  Depression screen PHQ 2/9  Decreased Interest 1 2  Down, Depressed, Hopeless 0 3  PHQ - 2 Score 1 5  Altered sleeping 2 3  Tired, decreased energy 1 2  Change in appetite 0 1  Feeling bad or failure about yourself  0 3  Trouble concentrating 0 1  Moving slowly or fidgety/restless 0 1  Suicidal thoughts 0 0  PHQ-9 Score 4 16  Difficult doing work/chores Not difficult at all Not difficult at all    The patient {has/does not have:19849} a history of falls. I {did/did not:19850} complete a risk assessment for falls. A plan  of care for falls {was/was not:19852} documented.   Past Medical History:  Past Medical History:  Diagnosis Date   GERD (gastroesophageal reflux disease)    Hypertension     Surgical History:  Past Surgical History:  Procedure Laterality Date   CESAREAN SECTION     GASTRIC BYPASS      Medications:  Current Outpatient Medications on File Prior to Visit  Medication Sig   Ashwagandha 120 MG CAPS Take by mouth.   citalopram (CELEXA) 10 MG tablet Take 1 tablet (10 mg total) by mouth daily.   cyanocobalamin (VITAMIN B12) 1000 MCG tablet Take 1,000 mcg by mouth daily.   ergocalciferol (VITAMIN D2) 50000 UNITS capsule Take 50,000 Units by mouth 2 (two) times a week.   furosemide (LASIX) 40 MG tablet Take 40 mg by mouth 2 (two) times a week.   Multiple Vitamin (MULTIVITAMIN WITH MINERALS) TABS tablet Take 1 tablet by mouth daily.   Suvorexant (BELSOMRA) 10 MG TABS Take 10 mg by mouth at bedtime as needed.   temazepam (RESTORIL) 30 MG capsule Take 1 capsule (30 mg total) by mouth at bedtime.  No current facility-administered medications on file prior to visit.    Allergies:  Allergies  Allergen Reactions   Penicillins Hives   Codeine Itching   Levaquin [Levofloxacin In D5w] Other (See Comments)    Thrush.    Lisinopril     Other reaction(s): Cough   Oxycodone-Acetaminophen Nausea Only   Tramadol Nausea Only   Oxycodone Itching    Social History:  Social History   Socioeconomic History   Marital status: Single    Spouse name: Not on file   Number of children: Not on file   Years of education: Not on file   Highest education level: Not on file  Occupational History   Not on file  Tobacco Use   Smoking status: Every Day    Packs/day: 0.50    Types: Cigarettes   Smokeless tobacco: Never  Vaping Use   Vaping Use: Never used  Substance and Sexual Activity   Alcohol use: No   Drug use: No   Sexual activity: Not Currently  Other Topics Concern   Not on file   Social History Narrative   Not on file   Social Determinants of Health   Financial Resource Strain: Not on file  Food Insecurity: Not on file  Transportation Needs: Not on file  Physical Activity: Not on file  Stress: Not on file  Social Connections: Not on file  Intimate Partner Violence: Not on file   Social History   Tobacco Use  Smoking Status Every Day   Packs/day: 0.50   Types: Cigarettes  Smokeless Tobacco Never   Social History   Substance and Sexual Activity  Alcohol Use No    Family History:  No family history on file.  Past medical history, surgical history, medications, allergies, family history and social history reviewed with patient today and changes made to appropriate areas of the chart.   ROS All other ROS negative except what is listed above and in the HPI.      Objective:    There were no vitals taken for this visit.  Wt Readings from Last 3 Encounters:  07/13/22 206 lb 4.8 oz (93.6 kg)  06/29/22 207 lb (93.9 kg)  11/27/19 189 lb (85.7 kg)    Physical Exam  Results for orders placed or performed in visit on 06/29/22  [facility] MAMMOGRAPHY  Result Value Ref Range   HM Mammogram 0-4 Bi-Rad 0-4 Bi-Rad, Self Reported Normal      Assessment & Plan:   Problem List Items Addressed This Visit       Cardiovascular and Mediastinum   Essential hypertension - Primary     Respiratory   Chronic obstructive pulmonary disease, unspecified ([place])     Other   Anxiety   Iron deficiency anemia   Vitamin D deficiency   Other Visit Diagnoses     Annual physical exam            Follow up plan: No follow-ups on file.   LABORATORY TESTING:  - Pap smear: {Blank [id number] done","not applicable","up to date","done elsewhere"}  IMMUNIZATIONS:   - Tdap: Tetanus vaccination status reviewed: {tetanus status:315746}. - Influenza: {Blank single:19197::"Up to date","Administered today","Postponed to flu season","Refused","Given elsewhere"} -  Pneumovax: {Blank single:19197::"Up to date","Administered today","Not applicable","Refused","Given elsewhere"} - Prevnar: {Blank single:19197::"Up to date","Administered today","Not applicable","Refused","Given elsewhere"} - COVID: {Blank single:19197::"Up to date","Administered today","Not applicable","Refused","Given elsewhere"} - HPV: {Blank single:19197::"Up to date","Administered today","Not applicable","Refused","Given elsewhere"} - Shingrix vaccine: {Blank single:19197::"Up to date","Administered today","Not applicable","Refused","Given elsewhere"}  SCREENING: -Mammogram: {Blank single:19197::"Up to date","Ordered today","Not  applicable","Refused","Done elsewhere"}  - Colonoscopy: {Blank single:19197::"Up to date","Ordered today","Not applicable","Refused","Done elsewhere"}  - Bone Density: {Blank single:19197::"Up to date","Ordered today","Not applicable","Refused","Done elsewhere"}  -Hearing Test: {Blank single:19197::"Up to date","Ordered today","Not applicable","Refused","Done elsewhere"}  -Spirometry: {Blank single:19197::"Up to date","Ordered today","Not applicable","Refused","Done elsewhere"}   PATIENT COUNSELING:   Advised to take 1 mg of folate supplement per day if capable of pregnancy.   Sexuality: Discussed sexually transmitted diseases, partner selection, use of condoms, avoidance of unintended pregnancy  and contraceptive alternatives.   Advised to avoid cigarette smoking.  I discussed with the patient that most people either abstain from alcohol or drink within safe limits (<=14/week and <=4 drinks/occasion for males, <=7/weeks and <= 3 drinks/occasion for females) and that the risk for alcohol disorders and other health effects rises proportionally with the number of drinks per week and how often a drinker exceeds daily limits.  Discussed cessation/primary prevention of drug use and availability of treatment for abuse.   Diet: Encouraged to adjust caloric intake to  maintain  or achieve ideal body weight, to reduce intake of dietary saturated fat and total fat, to limit sodium intake by avoiding high sodium foods and not adding table salt, and to maintain adequate dietary potassium and calcium preferably from fresh fruits, vegetables, and low-fat dairy products.    stressed the importance of regular exercise  Injury prevention: Discussed safety belts, safety helmets, smoke detector, smoking near bedding or upholstery.   Dental health: Discussed importance of regular tooth brushing, flossing, and dental visits.    NEXT PREVENTATIVE PHYSICAL DUE IN 1 YEAR. No follow-ups on file.

## 2022-08-12 ENCOUNTER — Ambulatory Visit (INDEPENDENT_AMBULATORY_CARE_PROVIDER_SITE_OTHER): Payer: No Typology Code available for payment source | Admitting: Nurse Practitioner

## 2022-08-12 ENCOUNTER — Encounter: Payer: Self-pay | Admitting: Nurse Practitioner

## 2022-08-12 VITALS — BP 142/85 | HR 92 | Temp 98.2°F | Wt 207.3 lb

## 2022-08-12 DIAGNOSIS — Z23 Encounter for immunization: Secondary | ICD-10-CM

## 2022-08-12 DIAGNOSIS — I10 Essential (primary) hypertension: Secondary | ICD-10-CM

## 2022-08-12 DIAGNOSIS — J449 Chronic obstructive pulmonary disease, unspecified: Secondary | ICD-10-CM | POA: Diagnosis not present

## 2022-08-12 DIAGNOSIS — Z0000 Encounter for general adult medical examination without abnormal findings: Secondary | ICD-10-CM | POA: Diagnosis not present

## 2022-08-12 DIAGNOSIS — F419 Anxiety disorder, unspecified: Secondary | ICD-10-CM

## 2022-08-12 DIAGNOSIS — E559 Vitamin D deficiency, unspecified: Secondary | ICD-10-CM | POA: Diagnosis not present

## 2022-08-12 DIAGNOSIS — D509 Iron deficiency anemia, unspecified: Secondary | ICD-10-CM

## 2022-08-12 DIAGNOSIS — F17200 Nicotine dependence, unspecified, uncomplicated: Secondary | ICD-10-CM

## 2022-08-12 DIAGNOSIS — R8290 Unspecified abnormal findings in urine: Secondary | ICD-10-CM

## 2022-08-12 DIAGNOSIS — Z1159 Encounter for screening for other viral diseases: Secondary | ICD-10-CM

## 2022-08-12 DIAGNOSIS — F5101 Primary insomnia: Secondary | ICD-10-CM

## 2022-08-12 DIAGNOSIS — Z79899 Other long term (current) drug therapy: Secondary | ICD-10-CM

## 2022-08-12 LAB — URINALYSIS, ROUTINE W REFLEX MICROSCOPIC
Bilirubin, UA: NEGATIVE
Glucose, UA: NEGATIVE
Leukocytes,UA: NEGATIVE
Nitrite, UA: POSITIVE — AB
Protein,UA: NEGATIVE
RBC, UA: NEGATIVE
Specific Gravity, UA: 1.025 (ref 1.005–1.030)
Urobilinogen, Ur: 2 mg/dL — ABNORMAL HIGH (ref 0.2–1.0)
pH, UA: 5.5 (ref 5.0–7.5)

## 2022-08-12 LAB — MICROSCOPIC EXAMINATION: RBC, Urine: NONE SEEN /hpf (ref 0–2)

## 2022-08-12 MED ORDER — VALSARTAN 80 MG PO TABS: 80.0000 mg | ORAL_TABLET | Freq: Every day | ORAL | 0 refills | Status: DC

## 2022-08-12 MED ORDER — CITALOPRAM HYDROBROMIDE 20 MG PO TABS: 20.0000 mg | ORAL_TABLET | Freq: Every day | ORAL | 1 refills | Status: DC

## 2022-08-12 MED ORDER — TEMAZEPAM 30 MG PO CAPS: 30.0000 mg | ORAL_CAPSULE | Freq: Every day | ORAL | 2 refills | Status: DC

## 2022-08-12 NOTE — Assessment & Plan Note (Signed)
Chronic. Improved.  Feels like increasing the dose would be beneficial.  Will increase to Celexa 20mg  daily.  Labs ordered today.  Follow up in 3 months.  Call sooner if concerns arise.

## 2022-08-12 NOTE — Addendum Note (Signed)
Addended by: Larae Grooms on: 08/12/2022 01:39 PM   Modules accepted: Orders

## 2022-08-12 NOTE — Assessment & Plan Note (Signed)
Chronic. Not well controlled.  Will start Valsartan 80mg  daily.  Side effects and benefits of medication discussed during visit.  Labs ordered.  Continue to check Follow up in 3 months.  Call sooner if concerns arise.

## 2022-08-12 NOTE — Assessment & Plan Note (Signed)
Labs ordered at visit today.  Will make recommendations based on lab results.   

## 2022-08-12 NOTE — Assessment & Plan Note (Signed)
Chronic. Not well controlled.  Would benefit from maintenance inhaler. Patient's insurance is changing next month. Will do Spirometry and start inhaler at next visit.  Follow up in 3 months.  Call sooner if concerns arise.

## 2022-08-12 NOTE — Assessment & Plan Note (Signed)
Chronic. Not well controlled. Only sleeping 3-3.5 hours nightly.  Has tried Temazepam, Ambien and Trazodone.  Has had adverse reactions to Ambien and Trazodone. Temazepam was the best option for sleep.  Belsomra was too expensive.  Patient aware of risks of taking medication and okay with continuing.  UDS and controlled substance agreement obtained today.  Side effects and benefits of medication discussed during visit.  Follow up in 3 months.

## 2022-08-13 ENCOUNTER — Encounter: Payer: Self-pay | Admitting: Nurse Practitioner

## 2022-08-13 LAB — LIPID PANEL
Chol/HDL Ratio: 5.3 ratio — ABNORMAL HIGH (ref 0.0–4.4)
Cholesterol, Total: 202 mg/dL — ABNORMAL HIGH (ref 100–199)
HDL: 38 mg/dL — ABNORMAL LOW (ref >39)
LDL Chol Calc (NIH): 132 mg/dL — ABNORMAL HIGH (ref 0–99)
Triglycerides: 176 mg/dL — ABNORMAL HIGH (ref 0–149)
VLDL Cholesterol Cal: 32 mg/dL (ref 5–40)

## 2022-08-13 LAB — COMPREHENSIVE METABOLIC PANEL
ALT: 11 IU/L (ref 0–32)
AST: 15 IU/L (ref 0–40)
Albumin/Globulin Ratio: 1.8 (ref 1.2–2.2)
Albumin: 4.3 g/dL (ref 3.9–4.9)
Alkaline Phosphatase: 125 IU/L — ABNORMAL HIGH (ref 44–121)
BUN/Creatinine Ratio: 17 (ref 12–28)
BUN: 12 mg/dL (ref 8–27)
Bilirubin Total: 0.2 mg/dL (ref 0.0–1.2)
CO2: 25 mmol/L (ref 20–29)
Calcium: 9.5 mg/dL (ref 8.7–10.3)
Chloride: 101 mmol/L (ref 96–106)
Creatinine, Ser: 0.69 mg/dL (ref 0.57–1.00)
Globulin, Total: 2.4 g/dL (ref 1.5–4.5)
Glucose: 77 mg/dL (ref 70–99)
Potassium: 4 mmol/L (ref 3.5–5.2)
Sodium: 141 mmol/L (ref 134–144)
Total Protein: 6.7 g/dL (ref 6.0–8.5)
eGFR: 95 mL/min/{1.73_m2} (ref >59)

## 2022-08-13 LAB — CBC WITH DIFFERENTIAL/PLATELET
Basophils Absolute: 0.1 10*3/uL (ref 0.0–0.2)
Basos: 1 %
EOS (ABSOLUTE): 0.1 10*3/uL (ref 0.0–0.4)
Eos: 1 %
Hematocrit: 46.5 % (ref 34.0–46.6)
Hemoglobin: 14.7 g/dL (ref 11.1–15.9)
Immature Grans (Abs): 0 10*3/uL (ref 0.0–0.1)
Immature Granulocytes: 0 %
Lymphocytes Absolute: 3.4 10*3/uL — ABNORMAL HIGH (ref 0.7–3.1)
Lymphs: 34 %
MCH: 28.3 pg (ref 26.6–33.0)
MCHC: 31.6 g/dL (ref 31.5–35.7)
MCV: 89 fL (ref 79–97)
Monocytes Absolute: 0.6 10*3/uL (ref 0.1–0.9)
Monocytes: 6 %
Neutrophils Absolute: 5.8 10*3/uL (ref 1.4–7.0)
Neutrophils: 58 %
Platelets: 284 10*3/uL (ref 150–450)
RBC: 5.2 x10E6/uL (ref 3.77–5.28)
RDW: 14.3 % (ref 11.7–15.4)
WBC: 10.1 10*3/uL (ref 3.4–10.8)

## 2022-08-13 LAB — HEPATITIS C ANTIBODY: Hep C Virus Ab: NONREACTIVE

## 2022-08-13 LAB — TSH: TSH: 1.49 u[IU]/mL (ref 0.450–4.500)

## 2022-08-13 LAB — VITAMIN D 25 HYDROXY (VIT D DEFICIENCY, FRACTURES): Vit D, 25-Hydroxy: 17 ng/mL — ABNORMAL LOW (ref 30.0–100.0)

## 2022-08-13 NOTE — Progress Notes (Signed)
Please let patient know that her cholesterol is elevated.  Her cardiac risk score puts her at high risk of having a stroke or heart attack over the next 10 years.  I recommend that she start a statin called crestor 5mg  daily.  The goal will be to increase this to 20mg  daily if patient tolerates it well.  If she agrees to the medication I can send it to the pharmacy.    Her vitamin D remains low.  Please find out if she is taking the Vitamin D 50,000 IU twice weekly.  If not, I will send in the prescription for this.   No other concerns at this time.  Continue with other medication regimen.

## 2022-08-14 MED ORDER — ROSUVASTATIN CALCIUM 5 MG PO TABS: 5.0000 mg | ORAL_TABLET | Freq: Every day | ORAL | 1 refills | Status: DC

## 2022-08-14 MED ORDER — ERGOCALCIFEROL 1.25 MG (50000 UT) PO CAPS: 50000.0000 [IU] | ORAL_CAPSULE | ORAL | 0 refills | Status: DC

## 2022-08-15 LAB — URINE CULTURE

## 2022-08-17 MED ORDER — SULFAMETHOXAZOLE-TRIMETHOPRIM 800-160 MG PO TABS: 1.0000 | ORAL_TABLET | Freq: Two times a day (BID) | ORAL | 0 refills | Status: DC

## 2022-08-17 NOTE — Addendum Note (Signed)
Addended by: Larae Grooms on: 08/17/2022 07:53 AM   Modules accepted: Orders

## 2022-08-17 NOTE — Progress Notes (Signed)
Please let patient know that her lab work shows that she did have a UTI.  I have sent in bactrim to treat this.  Please let me know if she has any questions.

## 2022-08-19 LAB — DRUG SCREEN 764883 11+OXYCO+ALC+CRT-BUND
Amphetamines, Urine: NEGATIVE ng/mL
Barbiturate: NEGATIVE ng/mL
Cannabinoid Quant, Ur: NEGATIVE ng/mL
Cocaine (Metabolite): NEGATIVE ng/mL
Creatinine: 138 mg/dL (ref 20.0–300.0)
Ethanol: NEGATIVE %
Meperidine: NEGATIVE ng/mL
Methadone Screen, Urine: NEGATIVE ng/mL
OPIATE SCREEN URINE: NEGATIVE ng/mL
Oxycodone/Oxymorphone, Urine: NEGATIVE ng/mL
Phencyclidine: NEGATIVE ng/mL
Propoxyphene: NEGATIVE ng/mL
Tramadol: NEGATIVE ng/mL
pH, Urine: 5.6 (ref 4.5–8.9)

## 2022-08-19 LAB — BENZODIAZEPINES CONFIRM, URINE
Alprazolam: NEGATIVE
Benzodiazepines: POSITIVE ng/mL — AB
Clonazepam: NEGATIVE
Flurazepam: NEGATIVE
Lorazepam: NEGATIVE
Midazolam: NEGATIVE
Nordiazepam: NEGATIVE
Oxazepam Conf: 4363 ng/mL
Oxazepam: POSITIVE — AB
Temazepam Conf.: >10000 ng/mL
Temazepam: POSITIVE — AB
Triazolam: NEGATIVE

## 2022-08-19 NOTE — Progress Notes (Signed)
Hi Wajiha.  Your urine drug screen looks good.  No concerns at this time.

## 2022-09-10 ENCOUNTER — Ambulatory Visit
Admission: RE | Admit: 2022-09-10 | Discharge: 2022-09-10 | Disposition: A | Discharge status: Home / Self Care | Payer: Medicare Other | Source: Ambulatory Visit | Attending: Nurse Practitioner | Admitting: Nurse Practitioner

## 2022-09-10 DIAGNOSIS — Z1231 Encounter for screening mammogram for malignant neoplasm of breast: Secondary | ICD-10-CM

## 2022-09-10 DIAGNOSIS — Z780 Asymptomatic menopausal state: Secondary | ICD-10-CM

## 2022-09-11 ENCOUNTER — Encounter: Payer: Self-pay | Admitting: Nurse Practitioner

## 2022-09-11 MED ORDER — ALENDRONATE SODIUM 70 MG PO TABS: 70.0000 mg | ORAL_TABLET | ORAL | 3 refills | Status: DC

## 2022-09-11 NOTE — Progress Notes (Signed)
Please let patient know that her lab work shows that she does have osteoporosis.  I recommend she start Fosamax once weekly.  This will help prevent further bone loss.  If she agrees, I will send this to the pharmacy for her.

## 2022-09-11 NOTE — Progress Notes (Signed)
Please let patient know her Mammogram did not show any evidence of a malignancy.  The recommendation is to repeat the Mammogram in 1 year.  

## 2022-09-15 ENCOUNTER — Inpatient Hospital Stay
Admission: RE | Admit: 2022-09-15 | Discharge: 2022-09-15 | Disposition: A | Discharge status: Home / Self Care | Payer: Self-pay | Source: Ambulatory Visit | Attending: *Deleted | Admitting: *Deleted

## 2022-09-15 ENCOUNTER — Other Ambulatory Visit: Payer: Self-pay | Admitting: *Deleted

## 2022-09-15 DIAGNOSIS — Z1231 Encounter for screening mammogram for malignant neoplasm of breast: Secondary | ICD-10-CM

## 2022-10-10 ENCOUNTER — Encounter: Payer: Self-pay | Admitting: Nurse Practitioner

## 2022-10-15 ENCOUNTER — Encounter: Payer: Self-pay | Admitting: Nurse Practitioner

## 2022-10-20 ENCOUNTER — Ambulatory Visit (INDEPENDENT_AMBULATORY_CARE_PROVIDER_SITE_OTHER): Payer: Medicare Other | Admitting: Nurse Practitioner

## 2022-10-20 ENCOUNTER — Encounter: Payer: Self-pay | Admitting: Nurse Practitioner

## 2022-10-20 VITALS — BP 142/86 | HR 80 | Temp 98.6°F | Ht 63.5 in | Wt 208.3 lb

## 2022-10-20 DIAGNOSIS — J449 Chronic obstructive pulmonary disease, unspecified: Secondary | ICD-10-CM

## 2022-10-20 DIAGNOSIS — F5101 Primary insomnia: Secondary | ICD-10-CM

## 2022-10-20 DIAGNOSIS — I10 Essential (primary) hypertension: Secondary | ICD-10-CM | POA: Diagnosis not present

## 2022-10-20 DIAGNOSIS — F419 Anxiety disorder, unspecified: Secondary | ICD-10-CM | POA: Diagnosis not present

## 2022-10-20 MED ORDER — SPIRIVA RESPIMAT 2.5 MCG/ACT IN AERS: 2.0000 | INHALATION_SPRAY | Freq: Every day | RESPIRATORY_TRACT | 3 refills | Status: DC

## 2022-10-20 MED ORDER — VALSARTAN 160 MG PO TABS: 160.0000 mg | ORAL_TABLET | Freq: Every day | ORAL | 3 refills | Status: DC

## 2022-10-20 MED ORDER — CITALOPRAM HYDROBROMIDE 40 MG PO TABS: 40.0000 mg | ORAL_TABLET | Freq: Every day | ORAL | 1 refills | Status: DC

## 2022-10-20 NOTE — Assessment & Plan Note (Addendum)
Chronic, uncontrolled. Increase valsartan dose to 160 mg daily for better BP control. Will consider increasing dose if BP remains uncontrolled.  Return in 1 month.

## 2022-10-20 NOTE — Progress Notes (Signed)
BP (!) 142/86 (BP Location: Left Arm, Cuff Size: Normal)   Pulse 80   Temp 98.6 F (37 C) (Oral)   Ht 5' 3.5" (1.613 m)   Wt 208 lb 4.8 oz (94.5 kg)   SpO2 97%   BMI 36.32 kg/m    Subjective:    Patient ID: Cassandra Brandt, female    DOB: [birth year], 67 y.o.   MRN: [id number]  HPI: Cassandra Brandt is a 67 y.o. female  [place], FNP.  ASSESSMENT AND PLAN OF CARE REVIEWED WITH STUDENT, AGREE WITH ABOVE FINDINGS AND PLAN.   Chief Complaint  Patient presents with   COPD   Anxiety   Hypertension   Patient is having increased stress about being retired. 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 She is currently looking for a part time job to help with her finances, but has been unsuccessful at this, which is also contributing to the stress she is experiencing.   HYPERTENSION without Chronic Kidney Disease Was taking 8m Valsartan for 2 months, has not been taking it for 2 weeks, and complains of increased headaches and elevated blood pressure readings.    Hypertension status: uncontrolled  Satisfied with current treatment? no Duration of hypertension: chronic BP monitoring frequency:  every other day  BP range: 172/95 on Valsartan. No valsartan 145-157/92-96.  BP medication side effects:  yes Medication compliance: excellent compliance Aspirin: yes 868mdaily  Recurrent headaches: yes; not as often.  Visual changes: no Palpitations: no Dyspnea: yes has hx of COPD Chest pain: no Lower extremity edema: yes sometimes Dizzy/lightheaded: no    COPD Not currently on any treatment regimen for this.  COPD status: worse Satisfied with current treatment?: no Oxygen use: no Dyspnea frequency: With activity when cleaning and shopping.  Cough frequency: Yes, daily in the evenings.  Rescue inhaler frequency:  Not using one Limitation of activity:  yes Productive cough: Clear; sometimes.  Last Spirometry:  Pneumovax: Up to Date Influenza: Up to Date   ANXIETY/STRESS  Taking Celexa daily  Duration:uncontrolled Anxious mood: no  Excessive worrying: yes about having enough money to make it to the next month.  Irritability: yes  Sweating: no Nausea: no Palpitations:yes Hyperventilation: no Panic attacks: yes; chest tightness, sometimes hard to breath. Happens 3-4x monthly.  Agoraphobia: no  Obscessions/compulsions: no Depressed mood:  lonesome    10/20/2022    1:20 PM 08/12/2022   10:45 AM 07/13/2022    2:23 PM 06/29/2022    3:39 PM  Depression screen PHQ 2/9  Decreased Interest 1 1 1 2  $ Down, Depressed, Hopeless 1 0 0 3  PHQ - 2 Score 2 1 1 5  $ Altered sleeping 0 2 2 3  $ Tired, decreased energy 1 1 1 2  $ Change in appetite 0 0 0 1  Feeling bad or failure about yourself  1 0 0 3  Trouble concentrating 0 0 0 1  Moving slowly or fidgety/restless 0 0 0 1  Suicidal thoughts 0 0 0 0  PHQ-9 Score 4 4 4 16  $ Difficult doing work/chores  Not difficult at all Not difficult at all Not difficult at all   Anhedonia: yes Weight changes: no Insomnia: no  None    Hypersomnia: no Fatigue/loss of energy: yes Feelings of worthlessness: no Feelings of guilt: no Impaired concentration/indecisiveness: no Suicidal ideations: no  Crying spells:  no Recent Stressors/Life Changes: yes; retired and feels lonesome.    Relationship problems: no   Family stress: yes     Financial stress: yes    Job stress: yes    Recent death/loss: no   INSOMNIA Taking Temazepam nightly, very satisfied with this regimen.  Getting 6 hours of sleep nightly.  Duration: chronic Satisfied with sleep quality: yes Difficulty falling asleep: no Difficulty staying asleep: no Waking a few hours after sleep onset: no Early morning awakenings: no Daytime hypersomnolence: no Wakes feeling refreshed: no Good sleep hygiene: no Apnea: no Snoring:  no Depressed/anxious mood: no Recent stress: no Restless legs/nocturnal leg cramps: no Chronic pain/arthritis: no History of sleep study: no Treatments attempted: Tamazepam    Relevant past medical, surgical, family and social history reviewed and updated as indicated. Interim medical history since our last visit reviewed. Allergies and medications reviewed and updated.  Review of Systems  Constitutional:  Positive for activity change and fatigue. Negative for diaphoresis.  Eyes:  Negative for visual disturbance.  Respiratory:  Positive for cough and chest tightness. Negative for apnea and shortness of breath.   Cardiovascular:  Positive for palpitations. Negative for chest pain.  Neurological:  Positive for headaches.  Psychiatric/Behavioral:  Negative for dysphoric mood and sleep disturbance. The patient is not nervous/anxious.     Per HPI unless specifically indicated above     Objective:    BP (!) 142/86 (BP Location: Left Arm, Cuff Size: Normal)   Pulse 80   Temp 98.6 F (37 C) (Oral)   Ht 5' 3.5" (1.613 m)   Wt 208 lb 4.8 oz (94.5 kg)   SpO2 97%   BMI 36.32 kg/m   Wt Readings from Last 3 Encounters:  10/20/22 208 lb 4.8 oz (94.5 kg)  08/12/22 207 lb 4.8 oz (94 kg)  07/13/22 206 lb 4.8 oz (93.6 kg)    Physical Exam Vitals and nursing note reviewed.  Constitutional:      General: She is awake. She is not in acute distress.    Appearance: She is well-developed and well-groomed. She is obese. She is not ill-appearing.  HENT:     Head: Normocephalic and atraumatic.     Right Ear: Hearing and external ear normal. No drainage.     Left Ear: Hearing and external ear normal. No drainage.     Nose: Nose normal.  Eyes:     General: Lids are normal.        Right eye: No discharge.        Left eye: No discharge.     Conjunctiva/sclera: Conjunctivae normal.  Cardiovascular:     Rate and Rhythm: Normal rate and regular rhythm.     Pulses:          Carotid pulses are  2+ on the right side and 2+ on the left side.    Heart sounds: Normal heart sounds, S1 normal and S2 normal. No murmur heard.    No gallop.  Pulmonary:     Effort: Pulmonary effort is normal. No accessory muscle usage or respiratory distress.     Comments: Expiratory wheezing in bilateral lower lung fields.  Musculoskeletal:        General: Normal range of motion.     Right lower leg: No edema.     Left lower leg: No edema.  Skin:    General: Skin is warm and dry.     Capillary Refill: Capillary refill takes less than 2 seconds.  Neurological:  Mental Status: She is alert and oriented to person, place, and time.  Psychiatric:        Attention and Perception: Attention normal.        Mood and Affect: Mood normal.        Speech: Speech normal.        Behavior: Behavior normal. Behavior is cooperative.        Thought Content: Thought content normal.     Results for orders placed or performed in visit on 08/12/22  Microscopic Examination   Urine  Result Value Ref Range   WBC, UA 0-5 0 - 5 /hpf   RBC, Urine None seen 0 - 2 /hpf   Epithelial Cells (non renal) 0-10 0 - 10 /hpf   Bacteria, UA Many (A) None seen/Few  Urine Culture   Specimen: Urine   UR  Result Value Ref Range   Urine Culture, Routine Final report (A)    Organism ID, Bacteria Escherichia coli (A)    Antimicrobial Susceptibility Comment   CBC with Differential/Platelet  Result Value Ref Range   WBC 10.1 3.4 - 10.8 x10E3/uL   RBC 5.20 3.77 - 5.28 x10E6/uL   Hemoglobin 14.7 11.1 - 15.9 g/dL   Hematocrit 46.5 34.0 - 46.6 %   MCV 89 79 - 97 fL   MCH 28.3 26.6 - 33.0 pg   MCHC 31.6 31.5 - 35.7 g/dL   RDW 14.3 11.7 - 15.4 %   Platelets 284 150 - 450 x10E3/uL   Neutrophils 58 Not Estab. %   Lymphs 34 Not Estab. %   Monocytes 6 Not Estab. %   Eos 1 Not Estab. %   Basos 1 Not Estab. %   Neutrophils Absolute 5.8 1.4 - 7.0 x10E3/uL   Lymphocytes Absolute 3.4 (H) 0.7 - 3.1 x10E3/uL   Monocytes Absolute 0.6 0.1 -  0.9 x10E3/uL   EOS (ABSOLUTE) 0.1 0.0 - 0.4 x10E3/uL   Basophils Absolute 0.1 0.0 - 0.2 x10E3/uL   Immature Granulocytes 0 Not Estab. %   Immature Grans (Abs) 0.0 0.0 - 0.1 x10E3/uL  Comprehensive metabolic panel  Result Value Ref Range   Glucose 77 70 - 99 mg/dL   BUN 12 8 - 27 mg/dL   Creatinine, Ser 0.69 0.57 - 1.00 mg/dL   eGFR 95 >59 mL/min/1.73   BUN/Creatinine Ratio 17 12 - 28   Sodium 141 134 - 144 mmol/L   Potassium 4.0 3.5 - 5.2 mmol/L   Chloride 101 96 - 106 mmol/L   CO2 25 20 - 29 mmol/L   Calcium 9.5 8.7 - 10.3 mg/dL   Total Protein 6.7 6.0 - 8.5 g/dL   Albumin 4.3 3.9 - 4.9 g/dL   Globulin, Total 2.4 1.5 - 4.5 g/dL   Albumin/Globulin Ratio 1.8 1.2 - 2.2   Bilirubin Total 0.2 0.0 - 1.2 mg/dL   Alkaline Phosphatase 125 (H) 44 - 121 IU/L   AST 15 0 - 40 IU/L   ALT 11 0 - 32 IU/L  Lipid panel  Result Value Ref Range   Cholesterol, Total 202 (H) 100 - 199 mg/dL   Triglycerides 176 (H) 0 - 149 mg/dL   HDL 38 (L) >39 mg/dL   VLDL Cholesterol Cal 32 5 - 40 mg/dL   LDL Chol Calc (NIH) 132 (H) 0 - 99 mg/dL   Chol/HDL Ratio 5.3 (H) 0.0 - 4.4 ratio  TSH  Result Value Ref Range   TSH 1.490 0.450 - 4.500 uIU/mL  Urinalysis, Routine w reflex microscopic  Result Value Ref Range   Specific Gravity, UA 1.025 1.005 - 1.030   pH, UA 5.5 5.0 - 7.5   Color, UA Yellow Yellow   Appearance Ur Cloudy (A) Clear   Leukocytes,UA Negative Negative   Protein,UA Negative Negative/Trace   Glucose, UA Negative Negative   Ketones, UA Trace (A) Negative   RBC, UA Negative Negative   Bilirubin, UA Negative Negative   Urobilinogen, Ur 2.0 (H) 0.2 - 1.0 mg/dL   Nitrite, UA Positive (A) Negative   Microscopic Examination See below:   Vitamin D (25 hydroxy)  Result Value Ref Range   Vit D, 25-Hydroxy 17.0 (L) 30.0 - 100.0 ng/mL  Hepatitis C Antibody  Result Value Ref Range   Hep C Virus Ab Non Reactive Non Reactive  [id number] 11+Oxyco+Alc+Crt-Bund  Result Value Ref Range   Ethanol  Negative Cutoff=0.020 %   Amphetamines, Urine Negative Cutoff=1000 ng/mL   Barbiturate Negative Cutoff=200 ng/mL   BENZODIAZ UR QL See Final Results Cutoff=200 ng/mL   Cannabinoid Quant, Ur Negative Cutoff=50 ng/mL   Cocaine (Metabolite) Negative Cutoff=300 ng/mL   OPIATE SCREEN URINE Negative Cutoff=300 ng/mL   Oxycodone/Oxymorphone, Urine Negative Cutoff=300 ng/mL   Phencyclidine Negative Cutoff=25 ng/mL   Methadone Screen, Urine Negative Cutoff=300 ng/mL   Propoxyphene Negative Cutoff=300 ng/mL   Meperidine Negative Cutoff=200 ng/mL   Tramadol Negative Cutoff=200 ng/mL   Creatinine 138.0 20.0 - 300.0 mg/dL   pH, Urine 5.6 4.5 - 8.9  Benzodiazepines Confirm, Urine  Result Value Ref Range   Benzodiazepines Positive (A) Cutoff=200 ng/mL   Nordiazepam Negative Cutoff=100   Oxazepam Positive (A)    Oxazepam Conf 4,363 Cutoff=100 ng/mL   Flurazepam Negative Cutoff=100   Lorazepam Negative Cutoff=100   Alprazolam Negative Cutoff=100   Clonazepam Negative Cutoff=100   Temazepam Positive (A)    Temazepam Conf. >10000 Cutoff=100 ng/mL   Triazolam Negative Cutoff=100   Midazolam Negative Cutoff=100      Assessment & Plan:   Problem List Items Addressed This Visit       Cardiovascular and Mediastinum   Essential hypertension    Chronic, uncontrolled. Increase valsartan dose to 160 mg daily for better BP control. Will consider increasing dose if BP remains uncontrolled.  Return in 1 month.        Respiratory   Chronic obstructive pulmonary disease, unspecified (HCC) - Primary    Chronic, worsening. Start taking Spiriva daily for relief of dyspnea with ADL's. Referral made for lung CT. Will consider PRN albuterol if exacerbations occur. Return in 1 month.          Other   Anxiety    Chronic, uncontrolled. Denies SI/HI.  Increase Celexa to 40 mg daily for better control of anxiety. Return in 1 month.       Primary insomnia    Well controlled on Temazepam, will return in  March 2024.         Follow up plan: Return for Keep scheduled follow up.

## 2022-10-20 NOTE — Assessment & Plan Note (Addendum)
Chronic, worsening. Start taking Spiriva daily for relief of dyspnea with ADL's. Referral made for lung CT. Will consider PRN albuterol if exacerbations occur. Return in 1 month.

## 2022-10-20 NOTE — Assessment & Plan Note (Addendum)
Chronic, uncontrolled. Denies SI/HI.  Increase Celexa to 40 mg daily for better control of anxiety. Return in 1 month.

## 2022-10-20 NOTE — Assessment & Plan Note (Signed)
Well controlled on Temazepam, will return in March 2024.

## 2022-10-21 ENCOUNTER — Encounter: Payer: Self-pay | Admitting: Nurse Practitioner

## 2022-11-11 ENCOUNTER — Ambulatory Visit: Payer: Medicare Other | Admitting: Nurse Practitioner

## 2022-11-11 ENCOUNTER — Telehealth: Payer: Self-pay

## 2022-11-11 ENCOUNTER — Encounter: Payer: Self-pay | Admitting: Nurse Practitioner

## 2022-11-11 ENCOUNTER — Other Ambulatory Visit: Payer: Self-pay

## 2022-11-11 VITALS — BP 156/89 | HR 80 | Temp 98.1°F | Wt 207.2 lb

## 2022-11-11 DIAGNOSIS — F5101 Primary insomnia: Secondary | ICD-10-CM

## 2022-11-11 DIAGNOSIS — Z0000 Encounter for general adult medical examination without abnormal findings: Secondary | ICD-10-CM | POA: Diagnosis not present

## 2022-11-11 DIAGNOSIS — Z87891 Personal history of nicotine dependence: Secondary | ICD-10-CM

## 2022-11-11 DIAGNOSIS — E559 Vitamin D deficiency, unspecified: Secondary | ICD-10-CM | POA: Diagnosis not present

## 2022-11-11 DIAGNOSIS — F17210 Nicotine dependence, cigarettes, uncomplicated: Secondary | ICD-10-CM

## 2022-11-11 DIAGNOSIS — J449 Chronic obstructive pulmonary disease, unspecified: Secondary | ICD-10-CM | POA: Diagnosis not present

## 2022-11-11 DIAGNOSIS — Z7189 Other specified counseling: Secondary | ICD-10-CM

## 2022-11-11 DIAGNOSIS — I10 Essential (primary) hypertension: Secondary | ICD-10-CM | POA: Diagnosis not present

## 2022-11-11 DIAGNOSIS — D509 Iron deficiency anemia, unspecified: Secondary | ICD-10-CM

## 2022-11-11 DIAGNOSIS — F419 Anxiety disorder, unspecified: Secondary | ICD-10-CM

## 2022-11-11 MED ORDER — TEMAZEPAM 30 MG PO CAPS: 30.0000 mg | ORAL_CAPSULE | Freq: Every day | ORAL | 2 refills | Status: DC

## 2022-11-11 MED ORDER — AMLODIPINE BESYLATE 5 MG PO TABS: 5.0000 mg | ORAL_TABLET | Freq: Every day | ORAL | 0 refills | Status: DC

## 2022-11-11 MED ORDER — ALBUTEROL SULFATE HFA 108 (90 BASE) MCG/ACT IN AERS: 2.0000 | INHALATION_SPRAY | Freq: Four times a day (QID) | RESPIRATORY_TRACT | 1 refills | Status: DC | PRN

## 2022-11-11 NOTE — Assessment & Plan Note (Signed)
Chronic. Not well controlled.  Will add amlodipine '5mg'$ .  Side effects and benefits of medication discussed during visit. Continue to check blood pressures at home.  Continue with Valsartan.  Follow up in 3 months.  Call sooner if concerns arise.

## 2022-11-11 NOTE — Assessment & Plan Note (Signed)
Chronic. Not well controlled.  Have not been successful at getting a maintenance inhaler.  Referral placed for CCM.  Albuterol given at visit today.  Follow up in 3 months.  Call sooner if concerns arise.

## 2022-11-11 NOTE — Assessment & Plan Note (Signed)
Chronic. Controlled with Temazepam.  Patient aware of risks of taking medication and okay with continuing.  UDS and controlled substance agreement are up to date.  Refills sent today.  Side effects and benefits of medication discussed during visit.  Follow up in 3 months.

## 2022-11-11 NOTE — Progress Notes (Signed)
Results discussed with patient during visit.

## 2022-11-11 NOTE — Assessment & Plan Note (Signed)
Chronic.  Controlled.  Continue with current medication regimen.  Labs ordered today.  Return to clinic in 6 months for reevaluation.  Call sooner if concerns arise.  ? ?

## 2022-11-11 NOTE — Assessment & Plan Note (Signed)
A voluntary discussion about advance care planning including the explanation and discussion of advance directives was extensively discussed  with the patient for 10 minutes with patient.Explanation about the health care proxy and Living will was reviewed and packet with forms with explanation of how to fill them out was given.  During this discussion, the patient does not have someone she is able to identify a health care proxy and plans to fill out a DNR.  Patient was offered a separate [place] visit for further assistance with forms.

## 2022-11-11 NOTE — Progress Notes (Signed)
BP (!) 156/89 (BP Location: Right Arm, Cuff Size: Large)   Pulse 80   Temp 98.1 F (36.7 C) (Oral)   Wt 207 lb 3.2 oz (94 kg)   SpO2 98%   BMI 36.13 kg/m    Subjective:    Patient ID: Cassandra Brandt, female    DOB: 11-19-1955, 67 y.o.   MRN: [id number]  HPI: Cassandra Brandt is a 67 y.o. female presenting on 11/11/2022 for comprehensive medical examination. Current medical complaints include: inhaler cost  She currently lives with: Menopausal Symptoms: no  HYPERTENSION without Chronic Kidney Disease Hypertension status: uncontrolled  Satisfied with current treatment? no Duration of hypertension: years BP monitoring frequency:  daily BP range: 140-150/90 BP medication side effects:  no Medication compliance: excellent compliance Previous BP meds:None Aspirin: no Recurrent headaches: no Visual changes: no Palpitations: no Dyspnea: yes Chest pain: no Lower extremity edema: no Dizzy/lightheaded: no  COPD Not able to afford spirivia.  The change in the weather has been hard on her. Does not currently have Albuterol.  COPD status: uncontrolled Satisfied with current treatment?: yes Oxygen use: no Dyspnea frequency: on cold days  Cough frequency:  Rescue inhaler frequency:   Limitation of activity: some Productive cough:  Last Spirometry: needs in the future Pneumovax: Up to Date Influenza: Up to Date  ANEMIA Anemia status: controlled Etiology of anemia: Duration of anemia treatment:  Compliance with treatment: excellent compliance Iron supplementation side effects: no Severity of anemia: mild Fatigue: no Decreased exercise tolerance: no  Dyspnea on exertion: no Palpitations: no Bleeding: no Pica: no  Functional Status Survey: Is the patient deaf or have difficulty hearing?: Yes Does the patient have difficulty seeing, even when wearing glasses/contacts?: No Does the patient have difficulty concentrating, remembering, or making decisions?: No Does the patient  have difficulty walking or climbing stairs?: No Does the patient have difficulty dressing or bathing?: No Does the patient have difficulty doing errands alone such as visiting a doctor's office or shopping?: No     11/11/2022    9:24 AM 10/20/2022    1:20 PM 08/12/2022   10:45 AM 07/13/2022    2:23 PM 06/29/2022    3:39 PM  [place] in the past year? 0 0 '1 1 1  '$ Number falls in past yr: 0 0 0 0 0  Injury with Fall? 0 0 0 1 0  Risk for fall due to : No Fall Risks No Fall Risks No Fall Risks History of fall(s) History of fall(s)  Follow up Falls evaluation completed Falls evaluation completed Falls evaluation completed Falls evaluation completed Falls evaluation completed    Depression Screen    11/11/2022    9:25 AM 10/20/2022    1:20 PM 08/12/2022   10:45 AM 07/13/2022    2:23 PM 06/29/2022    3:39 PM  Depression screen PHQ 2/9  Decreased Interest '1 1 1 1 2  '$ Down, Depressed, Hopeless 1 1 0 0 3  PHQ - 2 Score '2 2 1 1 5  '$ Altered sleeping 0 0 '2 2 3  '$ Tired, decreased energy '1 1 1 1 2  '$ Change in appetite 1 0 0 0 1  Feeling bad or failure about yourself  1 1 0 0 3  Trouble concentrating 1 0 0 0 1  Moving slowly or fidgety/restless 0 0 0 0 1  Suicidal thoughts 0 0 0 0 0  PHQ-9 Score '6 4 4 4 16  '$ Difficult doing work/chores Somewhat difficult  Not difficult at all Not difficult at all Not difficult at all     Advanced Directives Does patient have a HCPOA?    no If yes, name and contact information:  Does patient have a living will or MOST form?  no  Past Medical History:  Past Medical History:  Diagnosis Date   GERD (gastroesophageal reflux disease)    Hypertension     Surgical History:  Past Surgical History:  Procedure Laterality Date   CESAREAN SECTION     GASTRIC BYPASS     TONSILLECTOMY      Medications:  Current Outpatient Medications on File Prior to Visit  Medication Sig   alendronate (FOSAMAX) 70 MG tablet Take 1 tablet (70 mg total) by mouth  every 7 (seven) days. Take with a full glass of water on an empty stomach.   aspirin EC 81 MG tablet Take 81 mg by mouth daily. Swallow whole.   citalopram (CELEXA) 40 MG tablet Take 1 tablet (40 mg total) by mouth daily.   ergocalciferol (VITAMIN D2) 1.25 MG (50000 UT) capsule Take 1 capsule (50,000 Units total) by mouth 2 (two) times a week.   rosuvastatin (CRESTOR) 5 MG tablet Take 1 tablet (5 mg total) by mouth daily.   valsartan (DIOVAN) 160 MG tablet Take 1 tablet (160 mg total) by mouth daily.   Tiotropium Bromide Monohydrate (SPIRIVA RESPIMAT) 2.5 MCG/ACT AERS Inhale 2 puffs into the lungs daily. (Patient not taking: Reported on 11/11/2022)   No current facility-administered medications on file prior to visit.    Allergies:  Allergies  Allergen Reactions   Penicillins Hives   Codeine Itching   Levaquin [Levofloxacin In D5w] Other (See Comments)    Thrush.    Lisinopril     Other reaction(s): Cough   Oxycodone-Acetaminophen Nausea Only   Tramadol Nausea Only   Oxycodone Itching    Social History:  Social History   Socioeconomic History   Marital status: Single    Spouse name: Not on file   Number of children: Not on file   Years of education: Not on file   Highest education level: Not on file  Occupational History   Not on file  Tobacco Use   Smoking status: Every Day    Packs/day: 0.50    Types: Cigarettes   Smokeless tobacco: Never  Vaping Use   Vaping Use: Never used  Substance and Sexual Activity   Alcohol use: No   Drug use: No   Sexual activity: Not Currently  Other Topics Concern   Not on file  Social History Narrative   Not on file   Social Determinants of Health   Financial Resource Strain: Not on file  Food Insecurity: Not on file  Transportation Needs: Not on file  Physical Activity: Not on file  Stress: Not on file  Social Connections: Not on file  Intimate Partner Violence: Not on file   Social History   Tobacco Use  Smoking Status  Every Day   Packs/day: 0.50   Types: Cigarettes  Smokeless Tobacco Never   Social History   Substance and Sexual Activity  Alcohol Use No    Family History:  History reviewed. No pertinent family history.  Past medical history, surgical history, medications, allergies, family history and social history reviewed with patient today and changes made to appropriate areas of the chart.   Review of Systems  Eyes:  Negative for blurred vision and double vision.  Respiratory:  Positive for shortness of breath.  Cardiovascular:  Negative for chest pain, palpitations and leg swelling.  Neurological:  Negative for dizziness and headaches.  Psychiatric/Behavioral:  Positive for depression. Negative for suicidal ideas. The patient is nervous/anxious and has insomnia.     All other ROS negative except what is listed above and in the HPI.      Objective:    BP (!) 156/89 (BP Location: Right Arm, Cuff Size: Large)   Pulse 80   Temp 98.1 F (36.7 C) (Oral)   Wt 207 lb 3.2 oz (94 kg)   SpO2 98%   BMI 36.13 kg/m   Wt Readings from Last 3 Encounters:  11/11/22 207 lb 3.2 oz (94 kg)  10/20/22 208 lb 4.8 oz (94.5 kg)  08/12/22 207 lb 4.8 oz (94 kg)    Hearing Screening   '500Hz'$  '1000Hz'$  '2000Hz'$  '4000Hz'$   Right ear Fail Fail Fail Fail  Left ear Fail Fail Fail Fail   Vision Screening   Right eye Left eye Both eyes  Without correction '20/25 20/40 20/25 '$  With correction       Physical Exam Vitals and nursing note reviewed.  Constitutional:      General: She is not in acute distress.    Appearance: Normal appearance. She is normal weight. She is not ill-appearing, toxic-appearing or diaphoretic.  HENT:     Head: Normocephalic.     Right Ear: External ear normal.     Left Ear: External ear normal.     Nose: Nose normal.     Mouth/Throat:     Mouth: Mucous membranes are moist.     Pharynx: Oropharynx is clear.  Eyes:     General:        Right eye: No discharge.        Left eye: No  discharge.     Extraocular Movements: Extraocular movements intact.     Conjunctiva/sclera: Conjunctivae normal.     Pupils: Pupils are equal, round, and reactive to light.  Cardiovascular:     Rate and Rhythm: Normal rate and regular rhythm.     Heart sounds: No murmur heard. Pulmonary:     Effort: Pulmonary effort is normal. No respiratory distress.     Breath sounds: Normal breath sounds. No wheezing or rales.  Musculoskeletal:     Cervical back: Normal range of motion and neck supple.  Skin:    General: Skin is warm and dry.     Capillary Refill: Capillary refill takes less than 2 seconds.  Neurological:     General: No focal deficit present.     Mental Status: She is alert and oriented to person, place, and time. Mental status is at baseline.  Psychiatric:        Mood and Affect: Mood normal.        Behavior: Behavior normal.        Thought Content: Thought content normal.        Judgment: Judgment normal.         No data to display          Cognitive Testing - 6-CIT  Correct? Score   What year is it? yes 0 Yes = 0    No = 4  What month is it? yes 0 Yes = 0    No = 3  Remember:     Pia Mau, [place], [place]     What time is it? yes 0 Yes = 0    No = 3  Count backwards from  20 to 1 yes 0 Correct = 0    1 error = 2   More than 1 error = 4  Say the months of the year in reverse. yes 0 Correct = 0    1 error = 2   More than 1 error = 4  What address did I ask you to remember? yes 2 Correct = 0  1 error = 2    2 error = 4    3 error = 6    4 error = 8    All wrong = 10       TOTAL SCORE  4/28   Interpretation:  Normal  Normal (0-7) Abnormal (8-28)   Results for orders placed or performed in visit on 08/12/22  Microscopic Examination   Urine  Result Value Ref Range   WBC, UA 0-5 0 - 5 /hpf   RBC, Urine None seen 0 - 2 /hpf   Epithelial Cells (non renal) 0-10 0 - 10 /hpf   Bacteria, UA Many (A) None seen/Few  Urine Culture   Specimen: Urine   UR   Result Value Ref Range   Urine Culture, Routine Final report (A)    Organism ID, Bacteria Escherichia coli (A)    Antimicrobial Susceptibility Comment   CBC with Differential/Platelet  Result Value Ref Range   WBC 10.1 3.4 - 10.8 x10E3/uL   RBC 5.20 3.77 - 5.28 x10E6/uL   Hemoglobin 14.7 11.1 - 15.9 g/dL   Hematocrit 46.5 34.0 - 46.6 %   MCV 89 79 - 97 fL   MCH 28.3 26.6 - 33.0 pg   MCHC 31.6 31.5 - 35.7 g/dL   RDW 14.3 11.7 - 15.4 %   Platelets 284 150 - 450 x10E3/uL   Neutrophils 58 Not Estab. %   Lymphs 34 Not Estab. %   Monocytes 6 Not Estab. %   Eos 1 Not Estab. %   Basos 1 Not Estab. %   Neutrophils Absolute 5.8 1.4 - 7.0 x10E3/uL   Lymphocytes Absolute 3.4 (H) 0.7 - 3.1 x10E3/uL   Monocytes Absolute 0.6 0.1 - 0.9 x10E3/uL   EOS (ABSOLUTE) 0.1 0.0 - 0.4 x10E3/uL   Basophils Absolute 0.1 0.0 - 0.2 x10E3/uL   Immature Granulocytes 0 Not Estab. %   Immature Grans (Abs) 0.0 0.0 - 0.1 x10E3/uL  Comprehensive metabolic panel  Result Value Ref Range   Glucose 77 70 - 99 mg/dL   BUN 12 8 - 27 mg/dL   Creatinine, Ser 0.69 0.57 - 1.00 mg/dL   eGFR 95 >59 mL/min/1.73   BUN/Creatinine Ratio 17 12 - 28   Sodium 141 134 - 144 mmol/L   Potassium 4.0 3.5 - 5.2 mmol/L   Chloride 101 96 - 106 mmol/L   CO2 25 20 - 29 mmol/L   Calcium 9.5 8.7 - 10.3 mg/dL   Total Protein 6.7 6.0 - 8.5 g/dL   Albumin 4.3 3.9 - 4.9 g/dL   Globulin, Total 2.4 1.5 - 4.5 g/dL   Albumin/Globulin Ratio 1.8 1.2 - 2.2   Bilirubin Total 0.2 0.0 - 1.2 mg/dL   Alkaline Phosphatase 125 (H) 44 - 121 IU/L   AST 15 0 - 40 IU/L   ALT 11 0 - 32 IU/L  Lipid panel  Result Value Ref Range   Cholesterol, Total 202 (H) 100 - 199 mg/dL   Triglycerides 176 (H) 0 - 149 mg/dL   HDL 38 (L) >39 mg/dL   VLDL Cholesterol Cal 32 5 - 40  mg/dL   LDL Chol Calc (NIH) 132 (H) 0 - 99 mg/dL   Chol/HDL Ratio 5.3 (H) 0.0 - 4.4 ratio  TSH  Result Value Ref Range   TSH 1.490 0.450 - 4.500 uIU/mL  Urinalysis, Routine w reflex  microscopic  Result Value Ref Range   Specific Gravity, UA 1.025 1.005 - 1.030   pH, UA 5.5 5.0 - 7.5   Color, UA Yellow Yellow   Appearance Ur Cloudy (A) Clear   Leukocytes,UA Negative Negative   Protein,UA Negative Negative/Trace   Glucose, UA Negative Negative   Ketones, UA Trace (A) Negative   RBC, UA Negative Negative   Bilirubin, UA Negative Negative   Urobilinogen, Ur 2.0 (H) 0.2 - 1.0 mg/dL   Nitrite, UA Positive (A) Negative   Microscopic Examination See below:   Vitamin D (25 hydroxy)  Result Value Ref Range   Vit D, 25-Hydroxy 17.0 (L) 30.0 - 100.0 ng/mL  Hepatitis C Antibody  Result Value Ref Range   Hep C Virus Ab Non Reactive Non Reactive  [id number] 11+Oxyco+Alc+Crt-Bund  Result Value Ref Range   Ethanol Negative Cutoff=0.020 %   Amphetamines, Urine Negative Cutoff=1000 ng/mL   Barbiturate Negative Cutoff=200 ng/mL   BENZODIAZ UR QL See Final Results Cutoff=200 ng/mL   Cannabinoid Quant, Ur Negative Cutoff=50 ng/mL   Cocaine (Metabolite) Negative Cutoff=300 ng/mL   OPIATE SCREEN URINE Negative Cutoff=300 ng/mL   Oxycodone/Oxymorphone, Urine Negative Cutoff=300 ng/mL   Phencyclidine Negative Cutoff=25 ng/mL   Methadone Screen, Urine Negative Cutoff=300 ng/mL   Propoxyphene Negative Cutoff=300 ng/mL   Meperidine Negative Cutoff=200 ng/mL   Tramadol Negative Cutoff=200 ng/mL   Creatinine 138.0 20.0 - 300.0 mg/dL   pH, Urine 5.6 4.5 - 8.9  Benzodiazepines Confirm, Urine  Result Value Ref Range   Benzodiazepines Positive (A) Cutoff=200 ng/mL   Nordiazepam Negative Cutoff=100   Oxazepam Positive (A)    Oxazepam Conf 4,363 Cutoff=100 ng/mL   Flurazepam Negative Cutoff=100   Lorazepam Negative Cutoff=100   Alprazolam Negative Cutoff=100   Clonazepam Negative Cutoff=100   Temazepam Positive (A)    Temazepam Conf. >10000 Cutoff=100 ng/mL   Triazolam Negative Cutoff=100   Midazolam Negative Cutoff=100      Assessment & Plan:   Problem List Items Addressed This  Visit       Cardiovascular and Mediastinum   Essential hypertension    Chronic. Not well controlled.  Will add amlodipine '5mg'$ .  Side effects and benefits of medication discussed during visit. Continue to check blood pressures at home.  Continue with Valsartan.  Follow up in 3 months.  Call sooner if concerns arise.       Relevant Medications   aspirin EC 81 MG tablet   amLODipine (NORVASC) 5 MG tablet   Other Relevant Orders   Comp Met (CMET)     Respiratory   Chronic obstructive pulmonary disease, unspecified (HCC)    Chronic. Not well controlled.  Have not been successful at getting a maintenance inhaler.  Referral placed for CCM.  Albuterol given at visit today.  Follow up in 3 months.  Call sooner if concerns arise.       Relevant Medications   albuterol (VENTOLIN HFA) 108 (90 Base) MCG/ACT inhaler   Other Relevant Orders   AMB Referral to Pharmacy Medication Management     Other   Anxiety    Chronic.  Controlled.  Continue with current medication regimen of Celexa '40mg'$  daily.  Labs ordered today.  Return to clinic in 3 months for reevaluation.  Call sooner if concerns arise.        Iron deficiency anemia    Chronic.  Controlled.  Continue with current medication regimen.  Labs ordered today.  Return to clinic in 6 months for reevaluation.  Call sooner if concerns arise.        Relevant Orders   CBC w/Diff   Primary insomnia    Chronic. Controlled with Temazepam.  Patient aware of risks of taking medication and okay with continuing.  UDS and controlled substance agreement are up to date.  Refills sent today.  Side effects and benefits of medication discussed during visit.  Follow up in 3 months.      Vitamin D deficiency    Labs ordered at visit today.  Will make recommendations based on lab results.         Relevant Orders   Vitamin D (25 hydroxy)   Advanced care planning/counseling discussion    A voluntary discussion about advance care planning including the  explanation and discussion of advance directives was extensively discussed  with the patient for 10 minutes with patient.Explanation about the health care proxy and Living will was reviewed and packet with forms with explanation of how to fill them out was given.  During this discussion, the patient does not have someone she is able to identify a health care proxy and plans to fill out a DNR.  Patient was offered a separate [place] visit for further assistance with forms.         Other Visit Diagnoses     Welcome to Medicare preventive visit    -  Primary   EKG showed NSR.  Hearing and Vision screening completed.  Advanced Care planning completed.   Relevant Orders   EKG 12-Lead (Completed)        Preventative Services:  AAA screening: NA Health Risk Assessment and Personalized Prevention Plan: Up to date Bone Mass Measurements: Up to date Breast Cancer Screening: Up to date CVD Screening: Up to date Cervical Cancer Screening: NA Colon Cancer Screening: Refused Depression Screening: up to date Diabetes Screening: Up to date Glaucoma Screening: Up to date Hepatitis B vaccine: NA Hepatitis C screening: Up to date  HIV Screening: Up to date Flu Vaccine: Up to date Lung cancer Screening: Has it scheduled Obesity Screening: Up to date Pneumonia Vaccines (2): Up to date STI Screening: NA  Follow up plan: Return in about 3 months (around 02/11/2023) for HTN, HLD, DM2 FU.   LABORATORY TESTING:  - Pap smear: not applicable  IMMUNIZATIONS:   - Tdap: Tetanus vaccination status reviewed: last tetanus booster within 10 years. - Influenza: Up to date - Pneumovax: Up to date - Prevnar: Up to date - Zostavax vaccine: Up to date  SCREENING: -Mammogram: Up to date  - Colonoscopy:  Doesn't have anyone to drive her   - Bone Density: Up to date  -Hearing Test: Not applicable  -Spirometry: Not applicable   PATIENT COUNSELING:   Advised to take 1 mg of folate supplement  per day if capable of pregnancy.   Sexuality: Discussed sexually transmitted diseases, partner selection, use of condoms, avoidance of unintended pregnancy  and contraceptive alternatives.   Advised to avoid cigarette smoking.  I discussed with the patient that most people either abstain from alcohol or drink within safe limits (<=14/week and <=4 drinks/occasion for males, <=7/weeks and <= 3 drinks/occasion for females) and that the risk for alcohol disorders and other health effects rises proportionally with the number  of drinks per week and how often a drinker exceeds daily limits.  Discussed cessation/primary prevention of drug use and availability of treatment for abuse.   Diet: Encouraged to adjust caloric intake to maintain  or achieve ideal body weight, to reduce intake of dietary saturated fat and total fat, to limit sodium intake by avoiding high sodium foods and not adding table salt, and to maintain adequate dietary potassium and calcium preferably from fresh fruits, vegetables, and low-fat dairy products.    stressed the importance of regular exercise  Injury prevention: Discussed safety belts, safety helmets, smoke detector, smoking near bedding or upholstery.   Dental health: Discussed importance of regular tooth brushing, flossing, and dental visits.    NEXT PREVENTATIVE PHYSICAL DUE IN 1 YEAR. Return in about 3 months (around 02/11/2023) for HTN, HLD, DM2 FU.

## 2022-11-11 NOTE — Progress Notes (Signed)
   Care Guide Note  11/11/2022 Name: Cassandra Brandt MRN: [id number] DOB: 1956/01/16  Referred by: Jon Billings, NP Reason for referral : Care Coordination (Outreach to schedule with Pharm d )   Cassandra Brandt is a 67 y.o. year old female who is a primary care patient of Jon Billings, NP. Cassandra Brandt was referred to the pharmacist for assistance related to COPD.    An unsuccessful telephone outreach was attempted today to contact the patient who was referred to the pharmacy team for assistance with medication assistance. Additional attempts will be made to contact the patient.   Noreene Larsson, [place], [place] [address] Direct Dial: [phone] [email or web address]

## 2022-11-11 NOTE — Assessment & Plan Note (Signed)
Labs ordered at visit today.  Will make recommendations based on lab results.   

## 2022-11-11 NOTE — Assessment & Plan Note (Signed)
Chronic.  Controlled.  Continue with current medication regimen of Celexa '40mg'$  daily.  Labs ordered today.  Return to clinic in 3 months for reevaluation.  Call sooner if concerns arise.

## 2022-11-12 ENCOUNTER — Encounter: Payer: Self-pay | Admitting: Nurse Practitioner

## 2022-11-12 LAB — COMPREHENSIVE METABOLIC PANEL
ALT: 10 IU/L (ref 0–32)
AST: 15 IU/L (ref 0–40)
Albumin/Globulin Ratio: 2.1 (ref 1.2–2.2)
Albumin: 4.7 g/dL (ref 3.9–4.9)
Alkaline Phosphatase: 100 IU/L (ref 44–121)
BUN/Creatinine Ratio: 15 (ref 12–28)
BUN: 10 mg/dL (ref 8–27)
Bilirubin Total: 0.3 mg/dL (ref 0.0–1.2)
CO2: 25 mmol/L (ref 20–29)
Calcium: 9.3 mg/dL (ref 8.7–10.3)
Chloride: 100 mmol/L (ref 96–106)
Creatinine, Ser: 0.67 mg/dL (ref 0.57–1.00)
Globulin, Total: 2.2 g/dL (ref 1.5–4.5)
Glucose: 84 mg/dL (ref 70–99)
Potassium: 4.2 mmol/L (ref 3.5–5.2)
Sodium: 140 mmol/L (ref 134–144)
Total Protein: 6.9 g/dL (ref 6.0–8.5)
eGFR: 96 mL/min/{1.73_m2} (ref >59)

## 2022-11-12 LAB — CBC WITH DIFFERENTIAL/PLATELET
Basophils Absolute: 0.1 10*3/uL (ref 0.0–0.2)
Basos: 1 %
EOS (ABSOLUTE): 0.1 10*3/uL (ref 0.0–0.4)
Eos: 1 %
Hematocrit: 47.5 % — ABNORMAL HIGH (ref 34.0–46.6)
Hemoglobin: 14.9 g/dL (ref 11.1–15.9)
Immature Grans (Abs): 0 10*3/uL (ref 0.0–0.1)
Immature Granulocytes: 0 %
Lymphocytes Absolute: 2.4 10*3/uL (ref 0.7–3.1)
Lymphs: 30 %
MCH: 28.7 pg (ref 26.6–33.0)
MCHC: 31.4 g/dL — ABNORMAL LOW (ref 31.5–35.7)
MCV: 92 fL (ref 79–97)
Monocytes Absolute: 0.4 10*3/uL (ref 0.1–0.9)
Monocytes: 5 %
Neutrophils Absolute: 5.3 10*3/uL (ref 1.4–7.0)
Neutrophils: 63 %
Platelets: 275 10*3/uL (ref 150–450)
RBC: 5.19 x10E6/uL (ref 3.77–5.28)
RDW: 14.8 % (ref 11.7–15.4)
WBC: 8.3 10*3/uL (ref 3.4–10.8)

## 2022-11-12 LAB — VITAMIN D 25 HYDROXY (VIT D DEFICIENCY, FRACTURES): Vit D, 25-Hydroxy: 45.9 ng/mL (ref 30.0–100.0)

## 2022-11-12 NOTE — Progress Notes (Signed)
Hi Cassandra Brandt. Your lab work looks good.  You don't need the prescription vitamin D any longer.  You can do Vitamin D 1000 IU once daily.  This is over the counter.  I'll see you at our next visit.

## 2022-11-13 NOTE — Progress Notes (Signed)
   Care Guide Note  11/13/2022 Name: Cassandra Brandt MRN: [id number] DOB: 1956-06-21  Referred by: Jon Billings, NP Reason for referral : Care Coordination (Outreach to schedule with Pharm d )   Cassandra Brandt is a 67 y.o. year old female who is a primary care patient of Jon Billings, NP. Cassandra Brandt was referred to the pharmacist for assistance related to COPD.    Successful contact was made with the patient to discuss pharmacy services including being ready for the pharmacist to call at least 5 minutes before the scheduled appointment time, to have medication bottles and any blood sugar or blood pressure readings ready for review. The patient agreed to meet with the pharmacist via with the pharmacist via telephone visit on (date/time).  11/18/2022   Noreene Larsson, [place] , [place] [address] Direct Dial: [phone] [email or web address]

## 2022-11-18 ENCOUNTER — Other Ambulatory Visit: Payer: Medicare Other

## 2022-11-18 NOTE — Progress Notes (Signed)
   11/18/2022  Patient ID: Cassandra Brandt, female   DOB: 1956/06/16, 67 y.o.   MRN: [id number]  Subjective/Objective: Referral to pharmacy from PCP to assist with affordability of maintenance inhaler for COPD. -Patient recently prescribed Spiriva, but medication was going to be >400 on partD -Currently has albuterol rescue inhaler on hand but having to use frequently -Household income <$30,120  Assessment/Plan: -Meets income eligibility for BI patient assistance for Spiriva PAP -Cc'ing medication assistance team to initiate application process -Reaching out to PCP office to see if they have any maintenance inhaler samples in the mean time  Follow-Up: Patient has my direct number for any questions/concerns with application process  Darlina Guys, PharmD, DPLA

## 2022-11-18 NOTE — Progress Notes (Signed)
   11/18/2022  Patient ID: Cassandra Brandt, female   DOB: Aug 02, 1956, 67 y.o.   MRN: [id number]  Update to plan:  After discussing with PCP, we are going to pursue [facility] patient assistance.  Patient would benefit most from triple therapy at this time, would qualify for AZ&Me patient assistance, and office has sample she can pick up while application processing.  Contacting medication assistance team to send AZ&Me PAP application and notifying patient that [facility] office will have a Breztri sample for her to pick up this week.  Darlina Guys, PharmD, DPLA

## 2022-11-20 ENCOUNTER — Other Ambulatory Visit (HOSPITAL_COMMUNITY): Payer: Self-pay

## 2022-11-20 ENCOUNTER — Encounter: Payer: Self-pay | Admitting: Nurse Practitioner

## 2022-11-24 ENCOUNTER — Telehealth: Payer: Self-pay

## 2022-11-24 NOTE — Telephone Encounter (Signed)
PAP application for AZ&ME BREZTRI  has been mailed to pt home. I will fax PCP pages once I receive pt pages.   [facility] Patient Advocate [phone]

## 2022-11-24 NOTE — Progress Notes (Signed)
   11/24/2022  Patient ID: Cassandra Brandt, female   DOB: 1956-01-18, 67 y.o.   MRN: [id number]  Patient calling to check on application she is expecting for [facility] patient assistance.  Informed her that application was not sent out until Friday per communication from medication assistance team.  She will notify me if she has not received by Thursday.  Does have a sample on hand from PCP office.  Darlina Guys, PharmD, DPLA

## 2022-12-01 ENCOUNTER — Ambulatory Visit (INDEPENDENT_AMBULATORY_CARE_PROVIDER_SITE_OTHER): Payer: Medicare Other | Admitting: Acute Care

## 2022-12-01 ENCOUNTER — Encounter: Payer: Self-pay | Admitting: Acute Care

## 2022-12-01 DIAGNOSIS — F17210 Nicotine dependence, cigarettes, uncomplicated: Secondary | ICD-10-CM

## 2022-12-01 NOTE — Progress Notes (Signed)
Virtual Visit via Telephone Note  I connected with Cassandra Brandt on 12/01/22 at  3:30 PM EDT by telephone and verified that I am speaking with the correct person using two identifiers.  Location: Patient:  At home [address], [place], [place], [address]    I discussed the limitations, risks, security and privacy concerns of performing an evaluation and management service by telephone and the availability of in person appointments. I also discussed with the patient that there may be a patient responsible charge related to this service. The patient expressed understanding and agreed to proceed.   Shared Decision Making Visit Lung Cancer Screening Program [phone])   Eligibility: Age 67 y.o. Pack Years Smoking History Calculation 63 pack year smoking history (# packs/per year x # years smoked) Recent History of coughing up blood  no Unexplained weight loss? no ( >Than 15 pounds within the last 6 months ) Prior History Lung / other cancer no (Diagnosis within the last 5 years already requiring surveillance chest CT Scans). Smoking Status Current Smoker Former Smokers: Years since quit:  NA  Quit Date:  NA  Visit Components: Discussion included one or more decision making aids. yes Discussion included risk/benefits of screening. yes Discussion included potential follow up diagnostic testing for abnormal scans. yes Discussion included meaning and risk of over diagnosis. yes Discussion included meaning and risk of False Positives. yes Discussion included meaning of total radiation exposure. yes  Counseling Included: Importance of adherence to annual lung cancer LDCT screening. yes Impact of comorbidities on ability to participate in the program. yes Ability and willingness to under diagnostic treatment. yes  Smoking Cessation Counseling: Current Smokers:  Discussed importance of smoking cessation. yes Information about tobacco cessation classes and interventions  provided to patient. yes Patient provided with "ticket" for LDCT Scan. yes Symptomatic Patient. no  Counseling NA Diagnosis Code: Tobacco Use Z72.0 Asymptomatic Patient yes  Counseling (Intermediate counseling: > three minutes counseling) [id number] Former Smokers:  Discussed the importance of maintaining cigarette abstinence. yes Diagnosis Code: Personal History of Nicotine Dependence. [id number] Information about tobacco cessation classes and interventions provided to patient. Yes Patient provided with "ticket" for LDCT Scan. yes Written Order for Lung Cancer Screening with LDCT placed in Epic. Yes (CT Chest Lung Cancer Screening Low Dose W/O CM) [id number] Z12.2-Screening of respiratory organs Z87.891-Personal history of nicotine dependence  I have spent 25 minutes of face to face/ virtual visit   time with  Cassandra Brandt discussing the risks and benefits of lung cancer screening. We viewed / discussed a power point together that explained in detail the above noted topics. We paused at intervals to allow for questions to be asked and answered to ensure understanding.We discussed that the single most powerful action that she can take to decrease her risk of developing lung cancer is to quit smoking. We discussed whether or not she is ready to commit to setting a quit date. We discussed options for tools to aid in quitting smoking including nicotine replacement therapy, non-nicotine medications, support groups, Quit Smart classes, and behavior modification. We discussed that often times setting smaller, more achievable goals, such as eliminating 1 cigarette a day for a week and then 2 cigarettes a day for a week can be helpful in slowly decreasing the number of cigarettes smoked. This allows for a sense of accomplishment as well as providing a clinical benefit. I provided  her  with smoking cessation  information  with contact information for community resources, classes, free  nicotine replacement therapy, and  access to mobile apps, text messaging, and on-line smoking cessation help. I have also provided  her  the office contact information in the event she needs to contact me, or the screening staff. We discussed the time and location of the scan, and that either Doroteo Glassman RN, Joella Prince, RN  or I will call / send a letter with the results within 24-72 hours of receiving them. The patient verbalized understanding of all of  the above and had no further questions upon leaving the office. They have my contact information in the event they have any further questions.  I spent 3 minutes counseling on smoking cessation and the health risks of continued tobacco abuse.  I explained to the patient that there has been a high incidence of coronary artery disease noted on these exams. I explained that this is a non-gated exam therefore degree or severity cannot be determined. This patient is on statin therapy. I have asked the patient to follow-up with their PCP regarding any incidental finding of coronary artery disease and management with diet or medication as their PCP  feels is clinically indicated. The patient verbalized understanding of the above and had no further questions upon completion of the visit.      Magdalen Spatz, NP 12/01/2022

## 2022-12-01 NOTE — Patient Instructions (Signed)
Thank you for participating in the [facility]. It was our pleasure to meet you today. We will call you with the results of your scan within the next few days. Your scan will be assigned a Lung RADS category score by the physicians reading the scans.  This Lung RADS score determines follow up scanning.  See below for description of categories, and follow up screening recommendations. We will be in touch to schedule your follow up screening annually or based on recommendations of our providers. We will fax a copy of your scan results to your Primary Care Physician, or the physician who referred you to the program, to ensure they have the results. Please call the office if you have any questions or concerns regarding your scanning experience or results.  Our office number is [phone]. Please speak with Denise Phelps, RN. , or  Denise Buckner RN, They are  our Lung Cancer Screening RN.'s If They are unavailable when you call, Please leave a message on the voice mail. We will return your call at our earliest convenience.This voice mail is monitored several times a day.  Remember, if your scan is normal, we will scan you annually as long as you continue to meet the criteria for the program. (Age 50-80, Current smoker or smoker who has quit within the last 15 years). If you are a smoker, remember, quitting is the single most powerful action that you can take to decrease your risk of lung cancer and other pulmonary, breathing related problems. We know quitting is hard, and we are here to help.  Please let us know if there is anything we can do to help you meet your goal of quitting. If you are a former smoker, congratulations. We are proud of you! Remain smoke free! Remember you can refer friends or family members through the number above.  We will screen them to make sure they meet criteria for the program. Thank you for helping us take better care of you by  participating in Lung Screening.  You can receive free nicotine replacement therapy ( patches, gum or mints) by calling 1-800-QUIT NOW. Please call so we can get you on the path to becoming  a non-smoker. I know it is hard, but you can do this!  Lung RADS Categories:  Lung RADS 1: no nodules or definitely non-concerning nodules.  Recommendation is for a repeat annual scan in 12 months.  Lung RADS 2:  nodules that are non-concerning in appearance and behavior with a very low likelihood of becoming an active cancer. Recommendation is for a repeat annual scan in 12 months.  Lung RADS 3: nodules that are probably non-concerning , includes nodules with a low likelihood of becoming an active cancer.  Recommendation is for a 6-month repeat screening scan. Often noted after an upper respiratory illness. We will be in touch to make sure you have no questions, and to schedule your 6-month scan.  Lung RADS 4 A: nodules with concerning findings, recommendation is most often for a follow up scan in 3 months or additional testing based on our provider's assessment of the scan. We will be in touch to make sure you have no questions and to schedule the recommended 3 month follow up scan.  Lung RADS 4 B:  indicates findings that are concerning. We will be in touch with you to schedule additional diagnostic testing based on our provider's  assessment of the scan.  Other options for assistance in smoking cessation (   As covered by your insurance benefits)  Hypnosis for smoking cessation  [employer] [phone]  Acupuncture for smoking cessation  [facility] [phone]   

## 2022-12-02 ENCOUNTER — Ambulatory Visit
Admission: RE | Admit: 2022-12-02 | Discharge: 2022-12-02 | Disposition: A | Discharge status: Home / Self Care | Payer: Medicare Other | Source: Ambulatory Visit | Attending: Acute Care | Admitting: Acute Care

## 2022-12-02 ENCOUNTER — Other Ambulatory Visit: Payer: Self-pay | Admitting: Acute Care

## 2022-12-02 DIAGNOSIS — I2510 Atherosclerotic heart disease of native coronary artery without angina pectoris: Secondary | ICD-10-CM | POA: Insufficient documentation

## 2022-12-02 DIAGNOSIS — J432 Centrilobular emphysema: Secondary | ICD-10-CM | POA: Diagnosis not present

## 2022-12-02 DIAGNOSIS — F17210 Nicotine dependence, cigarettes, uncomplicated: Secondary | ICD-10-CM | POA: Diagnosis present

## 2022-12-02 DIAGNOSIS — Z87891 Personal history of nicotine dependence: Secondary | ICD-10-CM

## 2022-12-02 DIAGNOSIS — Z9884 Bariatric surgery status: Secondary | ICD-10-CM | POA: Diagnosis not present

## 2022-12-02 DIAGNOSIS — Z122 Encounter for screening for malignant neoplasm of respiratory organs: Secondary | ICD-10-CM | POA: Diagnosis not present

## 2022-12-02 DIAGNOSIS — I700 Atherosclerosis of aorta: Secondary | ICD-10-CM | POA: Insufficient documentation

## 2022-12-02 DIAGNOSIS — R911 Solitary pulmonary nodule: Secondary | ICD-10-CM | POA: Diagnosis not present

## 2022-12-08 NOTE — Telephone Encounter (Signed)
I have received pt pages and FAXED PROVIDER PAGE TO [phone] FOR Larae Grooms NP TO review, fill out and send back    [facility] [phone]

## 2022-12-14 ENCOUNTER — Telehealth: Payer: Self-pay

## 2022-12-14 NOTE — Telephone Encounter (Signed)
PA for Temazepam initiated and submitted via Cover My Meds. Key: [id number]

## 2022-12-15 NOTE — Telephone Encounter (Signed)
I receive provider pg and have Submitted application for BREZTRI to [place] for patient assistance.   Phone: [phone]  [facility] [phone]) [phone]

## 2022-12-16 NOTE — Telephone Encounter (Signed)
Medication has been approved. Called and LVM notifying patient of approval.

## 2022-12-16 NOTE — Telephone Encounter (Signed)
Received notification from [employer] regarding approval for BREZTRI Patient assistance approved from 12/15/2022 TO 08/31/2023  Phone: [phone]  Melanee Spry CPhT [facility] Patient [facility] [phone]) [phone]   SCANNING LETTER OF APPROVAL IN TO MEDIA OF CHART

## 2022-12-17 ENCOUNTER — Encounter: Payer: Self-pay | Admitting: Nurse Practitioner

## 2022-12-27 ENCOUNTER — Emergency Department: Payer: Medicare Other

## 2022-12-27 ENCOUNTER — Emergency Department
Admission: EM | Admit: 2022-12-27 | Discharge: 2022-12-28 | Disposition: A | Discharge status: Home / Self Care | Payer: Medicare Other | Attending: Emergency Medicine | Admitting: Emergency Medicine

## 2022-12-27 ENCOUNTER — Other Ambulatory Visit: Payer: Self-pay

## 2022-12-27 DIAGNOSIS — S6992XA Unspecified injury of left wrist, hand and finger(s), initial encounter: Secondary | ICD-10-CM | POA: Diagnosis present

## 2022-12-27 DIAGNOSIS — Z7982 Long term (current) use of aspirin: Secondary | ICD-10-CM | POA: Insufficient documentation

## 2022-12-27 DIAGNOSIS — I10 Essential (primary) hypertension: Secondary | ICD-10-CM | POA: Insufficient documentation

## 2022-12-27 DIAGNOSIS — Z79899 Other long term (current) drug therapy: Secondary | ICD-10-CM | POA: Diagnosis not present

## 2022-12-27 DIAGNOSIS — W010XXA Fall on same level from slipping, tripping and stumbling without subsequent striking against object, initial encounter: Secondary | ICD-10-CM | POA: Diagnosis not present

## 2022-12-27 DIAGNOSIS — S52502A Unspecified fracture of the lower end of left radius, initial encounter for closed fracture: Secondary | ICD-10-CM

## 2022-12-27 DIAGNOSIS — S52572A Other intraarticular fracture of lower end of left radius, initial encounter for closed fracture: Secondary | ICD-10-CM | POA: Insufficient documentation

## 2022-12-27 DIAGNOSIS — J449 Chronic obstructive pulmonary disease, unspecified: Secondary | ICD-10-CM | POA: Diagnosis not present

## 2022-12-27 HISTORY — DX: Chronic obstructive pulmonary disease, unspecified: J44.9

## 2022-12-27 NOTE — ED Triage Notes (Signed)
Pt states that she tripped over her dog approx 35 mins prior to arrival and caught herself with her left hand and wrist. Pt has swelling and pain in left wrist.

## 2022-12-28 ENCOUNTER — Other Ambulatory Visit: Payer: Self-pay | Admitting: Orthopedic Surgery

## 2022-12-28 ENCOUNTER — Encounter: Payer: Self-pay | Admitting: Orthopedic Surgery

## 2022-12-28 MED ORDER — IBUPROFEN 800 MG PO TABS
800.0000 mg | ORAL_TABLET | Freq: Once | ORAL | Status: AC
  Administered 2022-12-28: 800 mg via ORAL
  Filled 2022-12-28: qty 1

## 2022-12-28 MED ORDER — TRAMADOL HCL 50 MG PO TABS
50.0000 mg | ORAL_TABLET | Freq: Once | ORAL | Status: AC
  Administered 2022-12-28: 50 mg via ORAL
  Filled 2022-12-28: qty 1

## 2022-12-28 MED ORDER — TRAMADOL HCL 50 MG PO TABS: 50.0000 mg | ORAL_TABLET | Freq: Four times a day (QID) | ORAL | 0 refills | Status: DC | PRN

## 2022-12-28 MED ORDER — IBUPROFEN 800 MG PO TABS: 800.0000 mg | ORAL_TABLET | Freq: Three times a day (TID) | ORAL | 0 refills | Status: DC | PRN

## 2022-12-28 NOTE — ED Provider Notes (Signed)
[facility] Provider Note    Event Date/Time   First MD Initiated Contact with Patient 12/28/22 0003     (approximate)   History   Wrist Injury   HPI  DELCIA Brandt is a 67 y.o. female who presents to the ED from home with a chief complaint of left wrist injury.  Patient tripped over her dachshund approximately 35 minutes prior to arrival and suffered FOOSH injury to her left wrist.  She is right-hand dominant.  Presents with pain and swelling to her left wrist.  Denies striking head or LOC.  Denies anticoagulant use.  Voices no other concerns or injuries.     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 Authorizing Provider  ibuprofen (ADVIL) 800 MG tablet Take 1 tablet (800 mg total) by mouth every 8 (eight) hours as needed for moderate pain. 12/28/22  Yes Irean Hong, MD  traMADol (ULTRAM) 50 MG tablet Take 1 tablet (50 mg total) by mouth every 6 (six) hours as needed. 12/28/22  Yes Irean Hong, MD  albuterol (VENTOLIN HFA) 108 (90 Base) MCG/ACT inhaler Inhale 2 puffs into the  lungs every 6 (six) hours as needed for wheezing or shortness of breath. 11/11/22   Larae Grooms, NP  alendronate (FOSAMAX) 70 MG tablet Take 1 tablet (70 mg total) by mouth every 7 (seven) days. Take with a full glass of water on an empty stomach. 09/11/22   Larae Grooms, NP  amLODipine (NORVASC) 5 MG tablet Take 1 tablet (5 mg total) by mouth daily. 11/11/22   Larae Grooms, NP  aspirin EC 81 MG tablet Take 81 mg by mouth daily. Swallow whole.    [provider]  citalopram (CELEXA) 40 MG tablet Take 1 tablet (40 mg total) by mouth daily. 10/20/22   Larae Grooms, NP  rosuvastatin (CRESTOR) 5 MG tablet Take 1 tablet (5 mg total) by mouth daily. 08/14/22   Larae Grooms, NP  temazepam (RESTORIL) 30 MG capsule Take 1 capsule (30 mg total) by mouth at bedtime. 11/11/22   Larae Grooms, NP  Tiotropium Bromide Monohydrate (SPIRIVA RESPIMAT) 2.5 MCG/ACT AERS Inhale 2 puffs into the lungs daily. Patient not taking: Reported on 11/11/2022 10/20/22   Larae Grooms, NP  valsartan (DIOVAN) 160 MG tablet Take 1 tablet (160 mg total) by mouth daily. 10/20/22   Larae Grooms, NP     Allergies  Penicillins, Codeine, Levaquin [levofloxacin in d5w], Lisinopril, Oxycodone-acetaminophen, Tramadol, and Oxycodone   Family History  History reviewed. No pertinent family history.   Physical Exam  Triage Vital  Signs: ED Triage Vitals [12/27/22 2206]  Enc Vitals Group     BP 133/84     Pulse Rate 95     Resp 16     Temp 99.1 F (37.3 C)     Temp Source Oral     SpO2 98 %     Weight 201 lb (91.2 kg)     Height 5\' 4"  (1.626 m)     Head Circumference      Peak Flow      Pain Score 8     Pain Loc      Pain Edu?      Excl. in GC?     Updated Vital Signs: BP 133/84   Pulse 95   Temp 99.1 F (37.3 C) (Oral)   Resp 16   Ht 5\' 4"  (1.626 m)   Wt 91.2 kg   SpO2 98%   BMI 34.50 kg/m    General: Awake, no distress.  CV:  Good peripheral perfusion.  Resp:  Normal  effort.  Abd:  No distention.  Other:  Left wrist: Mild swelling noted.  Limited range of motion secondary to pain.  2+ radial pulses.  Brisk, less than 5-second capillary refill.   ED Results / Procedures / Treatments  Labs (all labs ordered are listed, but only abnormal results are displayed) Labs Reviewed - No data to display   EKG  None   RADIOLOGY I have independently visualized and interpreted patient's x-ray as well as noted the radiology interpretation:  Forearm x-ray: Acute comminuted distal radius fracture  Official radiology report(s): DG Forearm Left  Result Date: 12/27/2022 CLINICAL DATA:  Trauma EXAM: LEFT FOREARM - 2 VIEW COMPARISON:  None Available. FINDINGS: No malalignment. Acute comminuted intra-articular distal radius fracture with mild dorsal angulation and less than 1/4 shaft diameter dorsal displacement of distal fracture fragment. Negative for elbow effusion IMPRESSION: Acute comminuted intra-articular distal radius fracture. Electronically Signed   By: Jasmine Pang M.D.   On: 12/27/2022 22:45     PROCEDURES:  Critical Care performed: No  Procedures   MEDICATIONS ORDERED IN ED: Medications  ibuprofen (ADVIL) tablet 800 mg (800 mg Oral Given 12/28/22 0017)  traMADol (ULTRAM) tablet 50 mg (50 mg Oral Given 12/28/22 0017)     IMPRESSION / MDM / ASSESSMENT AND PLAN / ED COURSE  I reviewed the triage vital signs and the nursing notes.                             67 year old female presenting with left wrist fracture.  Will place an OCL splint, provide sling, orthopedics follow-up.  Administer NSAIDs, tramadol per patient's request as she cannot tolerate other pain medications.  Strict return precautions given.  Patient verbalizes understanding and agrees with plan of care.  Patient's presentation is most consistent with acute, uncomplicated illness.   FINAL CLINICAL IMPRESSION(S) / ED DIAGNOSES   Final diagnoses:  Closed fracture of distal end of  left radius, unspecified fracture morphology, initial encounter     Rx / DC Orders   ED Discharge Orders          Ordered    traMADol (ULTRAM) 50 MG tablet  Every 6 hours PRN        12/28/22 0012    ibuprofen (ADVIL) 800 MG tablet  Every 8 hours PRN        12/28/22 0012  Note:  This document was prepared using Dragon voice recognition software and may include unintentional dictation errors.   Irean Hong, MD 12/28/22 [phone]

## 2022-12-28 NOTE — Discharge Instructions (Signed)
1.  You may take pain medicines as needed (Motrin/tramadol). 2.  Keep splint clean and dry.  Elevate affected area and apply ice over splint at all times daily to reduce swelling. 3.  Return to the ER for worsening symptoms, persistent vomiting, difficulty breathing or other concerns.

## 2022-12-29 ENCOUNTER — Ambulatory Visit: Payer: Medicare Other | Admitting: Certified Registered"

## 2022-12-29 ENCOUNTER — Other Ambulatory Visit: Payer: Self-pay

## 2022-12-29 ENCOUNTER — Ambulatory Visit
Admission: RE | Admit: 2022-12-29 | Discharge: 2022-12-29 | Disposition: A | Discharge status: Home / Self Care | Payer: Medicare Other | Attending: Orthopedic Surgery | Admitting: Orthopedic Surgery

## 2022-12-29 ENCOUNTER — Encounter: Payer: Self-pay | Admitting: Orthopedic Surgery

## 2022-12-29 ENCOUNTER — Ambulatory Visit: Payer: Self-pay

## 2022-12-29 ENCOUNTER — Encounter
Admission: RE | Disposition: A | Discharge status: Home / Self Care | Payer: Self-pay | Source: Home / Self Care | Attending: Orthopedic Surgery

## 2022-12-29 DIAGNOSIS — I10 Essential (primary) hypertension: Secondary | ICD-10-CM | POA: Diagnosis not present

## 2022-12-29 DIAGNOSIS — S52552A Other extraarticular fracture of lower end of left radius, initial encounter for closed fracture: Secondary | ICD-10-CM | POA: Insufficient documentation

## 2022-12-29 DIAGNOSIS — F17210 Nicotine dependence, cigarettes, uncomplicated: Secondary | ICD-10-CM | POA: Diagnosis not present

## 2022-12-29 DIAGNOSIS — S52502A Unspecified fracture of the lower end of left radius, initial encounter for closed fracture: Secondary | ICD-10-CM | POA: Diagnosis present

## 2022-12-29 DIAGNOSIS — Z79899 Other long term (current) drug therapy: Secondary | ICD-10-CM | POA: Insufficient documentation

## 2022-12-29 DIAGNOSIS — W010XXA Fall on same level from slipping, tripping and stumbling without subsequent striking against object, initial encounter: Secondary | ICD-10-CM | POA: Insufficient documentation

## 2022-12-29 DIAGNOSIS — J449 Chronic obstructive pulmonary disease, unspecified: Secondary | ICD-10-CM | POA: Insufficient documentation

## 2022-12-29 DIAGNOSIS — J439 Emphysema, unspecified: Secondary | ICD-10-CM | POA: Diagnosis not present

## 2022-12-29 HISTORY — DX: Presence of dental prosthetic device (complete) (partial): Z97.2

## 2022-12-29 HISTORY — DX: Dizziness and giddiness: R42

## 2022-12-29 HISTORY — PX: OPEN REDUCTION INTERNAL FIXATION (ORIF) DISTAL RADIAL FRACTURE: SHX5989

## 2022-12-29 SURGERY — OPEN REDUCTION INTERNAL FIXATION (ORIF) DISTAL RADIUS FRACTURE
Anesthesia: General | Site: Wrist | Laterality: Left

## 2022-12-29 MED ORDER — FENTANYL CITRATE (PF) 100 MCG/2ML IJ SOLN
INTRAMUSCULAR | Status: DC | PRN
  Administered 2022-12-29: 25 ug via INTRAVENOUS

## 2022-12-29 MED ORDER — PROPOFOL 500 MG/50ML IV EMUL
INTRAVENOUS | Status: DC | PRN
  Administered 2022-12-29: 130 ug/kg/min via INTRAVENOUS

## 2022-12-29 MED ORDER — FENTANYL CITRATE (PF) 100 MCG/2ML IJ SOLN
100.0000 ug | Freq: Once | INTRAMUSCULAR | Status: AC
  Administered 2022-12-29: 50 ug via INTRAVENOUS

## 2022-12-29 MED ORDER — PROPOFOL 10 MG/ML IV BOLUS
INTRAVENOUS | Status: DC | PRN
  Administered 2022-12-29: 50 mg via INTRAVENOUS

## 2022-12-29 MED ORDER — PHENYLEPHRINE 80 MCG/ML (10ML) SYRINGE FOR IV PUSH (FOR BLOOD PRESSURE SUPPORT)
PREFILLED_SYRINGE | INTRAVENOUS | Status: DC | PRN
  Administered 2022-12-29 (×2): 100 ug via INTRAVENOUS

## 2022-12-29 MED ORDER — LACTATED RINGERS IV SOLN: INTRAVENOUS | Status: DC

## 2022-12-29 MED ORDER — MIDAZOLAM HCL 2 MG/2ML IJ SOLN
1.0000 mg | INTRAMUSCULAR | Status: DC | PRN
  Administered 2022-12-29: 1 mg via INTRAVENOUS

## 2022-12-29 MED ORDER — ROPIVACAINE HCL 5 MG/ML IJ SOLN: 20.0000 mL | Freq: Once | INTRAMUSCULAR | Status: DC

## 2022-12-29 MED ORDER — ONDANSETRON HCL 4 MG/2ML IJ SOLN
INTRAMUSCULAR | Status: DC | PRN
  Administered 2022-12-29: 4 mg via INTRAVENOUS

## 2022-12-29 MED ORDER — MIDAZOLAM HCL 2 MG/2ML IJ SOLN
0.5000 mg | Freq: Once | INTRAMUSCULAR | Status: AC
  Administered 2022-12-29: 0.5 mg via INTRAVENOUS

## 2022-12-29 MED ORDER — DEXAMETHASONE SODIUM PHOSPHATE 10 MG/ML IJ SOLN
INTRAMUSCULAR | Status: DC | PRN
  Administered 2022-12-29: 4 mg via INTRAVENOUS

## 2022-12-29 MED ORDER — FENTANYL CITRATE PF 50 MCG/ML IJ SOSY
25.0000 ug | PREFILLED_SYRINGE | Freq: Once | INTRAMUSCULAR | Status: AC
  Administered 2022-12-29: 25 ug via INTRAVENOUS

## 2022-12-29 MED ORDER — CEFAZOLIN SODIUM-DEXTROSE 2-4 GM/100ML-% IV SOLN: 2.0000 g | INTRAVENOUS | Status: DC

## 2022-12-29 MED ORDER — DEXAMETHASONE SODIUM PHOSPHATE 10 MG/ML IJ SOLN: 10.0000 mg | Freq: Once | INTRAMUSCULAR | Status: DC

## 2022-12-29 MED ORDER — 0.9 % SODIUM CHLORIDE (POUR BTL) OPTIME
TOPICAL | Status: DC | PRN
  Administered 2022-12-29: 500 mL

## 2022-12-29 MED ORDER — PHENYLEPHRINE HCL (PRESSORS) 10 MG/ML IV SOLN
INTRAVENOUS | Status: DC | PRN
  Administered 2022-12-29: 100 ug via INTRAVENOUS

## 2022-12-29 MED ORDER — HYDROCODONE-ACETAMINOPHEN 5-325 MG PO TABS: 1.0000 | ORAL_TABLET | ORAL | 0 refills | Status: DC | PRN

## 2022-12-29 MED ORDER — ONDANSETRON HCL 8 MG PO TABS: 8.0000 mg | ORAL_TABLET | Freq: Three times a day (TID) | ORAL | 0 refills | Status: DC | PRN

## 2022-12-29 SURGICAL SUPPLY — 36 items
APL PRP STRL LF DISP 70% ISPRP (MISCELLANEOUS) ×1
BIT DRILL 2 FAST STEP (BIT) IMPLANT
BIT DRILL 2.5X4 QC (BIT) IMPLANT
BNDG CMPR STD VLCR NS LF 5.8X4 (GAUZE/BANDAGES/DRESSINGS) ×1
BNDG ELASTIC 4X5.8 VLCR NS LF (GAUZE/BANDAGES/DRESSINGS) ×1 IMPLANT
CHLORAPREP W/TINT 26 (MISCELLANEOUS) ×1 IMPLANT
COVER LIGHT HANDLE UNIVERSAL (MISCELLANEOUS) ×2 IMPLANT
CUFF TOURN SGL QUICK 18X4 (TOURNIQUET CUFF) IMPLANT
DRAPE FLUOR MINI C-ARM 54X84 (DRAPES) ×1 IMPLANT
ELECT REM PT RETURN 9FT ADLT (ELECTROSURGICAL) ×1
ELECTRODE REM PT RTRN 9FT ADLT (ELECTROSURGICAL) ×1 IMPLANT
GAUZE SPONGE 4X4 12PLY STRL (GAUZE/BANDAGES/DRESSINGS) ×1 IMPLANT
GAUZE XEROFORM 1X8 LF (GAUZE/BANDAGES/DRESSINGS) ×2 IMPLANT
GLOVE SURG SYN 9.0  PF PI (GLOVE) ×1
GLOVE SURG SYN 9.0 PF PI (GLOVE) ×1 IMPLANT
GOWN STRL REIN 2XL XLG LVL4 (GOWN DISPOSABLE) ×1 IMPLANT
GOWN STRL REUS W/ TWL LRG LVL3 (GOWN DISPOSABLE) ×1 IMPLANT
GOWN STRL REUS W/TWL LRG LVL3 (GOWN DISPOSABLE) ×1
K-WIRE 1.6 (WIRE) ×1
K-WIRE FX5X1.6XNS BN SS (WIRE) ×1
KIT TURNOVER KIT A (KITS) ×1 IMPLANT
KWIRE FX5X1.6XNS BN SS (WIRE) IMPLANT
NS IRRIG 500ML POUR BTL (IV SOLUTION) ×1 IMPLANT
PACK EXTREMITY ARMC (MISCELLANEOUS) ×1 IMPLANT
PADDING CAST BLEND 4X4 STRL (MISCELLANEOUS) ×2 IMPLANT
PEG SUBCHONDRAL SMOOTH 2.0X14 (Peg) IMPLANT
PEG SUBCHONDRAL SMOOTH 2.0X18 (Peg) IMPLANT
PEG SUBCHONDRAL SMOOTH 2.0X24 (Peg) IMPLANT
SCREW CORT 3.5X10 LNG (Screw) IMPLANT
SCREW MULTI DIRECT 18MM (Screw) IMPLANT
SCREW MULTI DIRECT 20MM (Screw) IMPLANT
SPLINT CAST 1 STEP 3X12 (MISCELLANEOUS) ×1 IMPLANT
SUT ETHILON 4-0 (SUTURE) ×1
SUT ETHILON 4-0 FS2 18XMFL BLK (SUTURE) ×1
SUT VICRYL 3-0 27IN (SUTURE) ×1 IMPLANT
SUTURE ETHLN 4-0 FS2 18XMF BLK (SUTURE) ×1 IMPLANT

## 2022-12-29 NOTE — Anesthesia Procedure Notes (Signed)
Anesthesia Regional Block: Supraclavicular block   Pre-Anesthetic Checklist: , timeout performed,  Correct Patient, Correct Site, Correct Laterality,  Correct Procedure, Correct Position, site marked,  Risks and benefits discussed,  Surgical consent,  Pre-op evaluation,  At surgeon's request and post-op pain management  Laterality: Left  Prep: chloraprep       Needles:  Injection technique: Single-shot  Needle Type: Echogenic Stimulator Needle      Needle Gauge: 21     Additional Needles:   Procedures:, nerve stimulator,,, ultrasound used (permanent image in chart),,     Nerve Stimulator or Paresthesia:  Response: bicep contraction, 0.45 mA  Additional Responses:   Narrative:  Start time: 12/29/2022 12:08 PM End time: 12/29/2022 12:22 PM Injection made incrementally with aspirations every 5 mL.  Performed by: Personally   Additional Notes: Functioning IV was confirmed and monitors applied.  Sterile prep and drape,hand hygiene and sterile gloves were used.Ultrasound guidance: relevant anatomy identified, needle position confirmed, local anesthetic spread visualized around nerve(s)., vascular puncture avoided.  Image printed for medical record.  Negative aspiration and negative test dose prior to incremental administration of local anesthetic. The patient tolerated the procedure well. Vitals signes recorded in RN notes.

## 2022-12-29 NOTE — Discharge Instructions (Signed)
Keep arm elevated is much as you can Work on finger motion all you can trying to make a fist and fully straighten your fingers Ice to the back of the hand today and tomorrow may help with pain and swelling Keep splint clean and dry Pain medicine and nausea medicine as directed Call office if you are having problems  [phone]

## 2022-12-29 NOTE — Op Note (Signed)
12/29/2022  2:27 PM  PATIENT:  Cassandra Brandt  67 y.o. female  PRE-OPERATIVE DIAGNOSIS:  Closed fracture of distal end of left radius, unspecified fracture morphology, initial encounter extra-articular  POST-OPERATIVE DIAGNOSIS:  Closed fracture of distal end of left radius, unspecified fracture morphology, initial encounter extra-articular  PROCEDURE:  Procedure(s): OPEN REDUCTION INTERNAL FIXATION (ORIF) DISTAL RADIUS FRACTURE (Left)  SURGEON: Leitha Schuller, MD  ASSISTANTS: None  ANESTHESIA:   paracervical block  EBL:  Total I/O In: 700 [I.V.:700] Out: -   BLOOD ADMINISTERED:none  DRAINS: none   LOCAL MEDICATIONS USED:  NONE patient received block no local required  SPECIMEN:  No Specimen  DISPOSITION OF SPECIMEN:  N/A  COUNTS:  YES  TOURNIQUET:   Total Tourniquet Time Documented: Forearm (Left) - 27 minutes Total: Forearm (Left) - 27 minutes   IMPLANTS: [employer] short narrow DVR plate with multiple screws smooth and multi axial directions pegs  DICTATION: .Dragon Dictation  patient was brought to the operating room and after the arm was prepped and draped in the usual sterile fashion after a regional block placed by anesthesia before coming back to the OR was obtained.   Tourniquet having been applied to the upper forearm.  Appropriate patient identification and timeout procedures were completed.  Tourniquet was raised and a volar approach was made after traction was applied at the end of the table through fingertrap traction.  Approach is center of the FCR tendon.  Tendon sheath incised and the tendon retracted radially protect the radial artery and associated veins.  The deep fascia was incised and the pronator was elevated off the distal and proximal fragments with exposure of the fracture site.  The fracture was extra-articular and with traction applied and Freer elevator adequate alignment was obtained.  A short standard DVR plate applied with distal first  technique using a K wire to hold the plate in position position of the plate was checked in both AP and lateral projections.  When acceptable position was obtained the smooth pegs were placed drilling measuring and placing the smooth pegs in the distal fragment along with multiple multidirectional screws to make sure there is adequate fixation in the distal fragment, following this the shaft portion of the plate was brought down to the bone and 2 10 mm cortical screws were sequentially inserted they gave an essentially anatomic alignment in both AP and lateral projections.  Traction was removed and under fluoroscopic views there is no motion of the fracture.   The wound irrigated with tourniquet let down.  Wound was then closed with 3-0 Vicryl for the deep tissue and 4-0 nylon in a simple interrupted manner..  Dressing of 4 x 4's web roll and volar splint followed by Ace wrap applied.     PLAN OF CARE: Discharge to home after PACU  PATIENT DISPOSITION:  PACU - hemodynamically stable.

## 2022-12-29 NOTE — Anesthesia Preprocedure Evaluation (Addendum)
Anesthesia Evaluation  Patient identified by MRN, date of birth, ID band Patient awake    Reviewed: Allergy & Precautions, H&P , NPO status , Patient's Chart, lab work & pertinent test results  Airway Mallampati: II  TM Distance: <3 FB Neck ROM: Full    Dental  (+) Edentulous Upper   Pulmonary COPD, Current Smoker and Patient abstained from smoking. CT 12/02/22 "mild" emphysema   Pulmonary exam normal breath sounds clear to auscultation       Cardiovascular Exercise Tolerance: Good hypertension, Pt. on medications Normal cardiovascular exam Rhythm:Regular Rate:Normal  States she can go up a flight of steps without difficulty   Neuro/Psych   Anxiety     Vertigo  negative neurological ROS  negative psych ROS   GI/Hepatic negative GI ROS, Neg liver ROS,GERD  ,,  Endo/Other  negative endocrine ROS    Renal/GU negative Renal ROS  negative genitourinary   Musculoskeletal negative musculoskeletal ROS (+)    Abdominal   Peds negative pediatric ROS (+)  Hematology negative hematology ROS (+) Blood dyscrasia, anemia   Anesthesia Other Findings Hypertension  GERD (gastroesophageal reflux disease) COPD (chronic obstructive pulmonary disease) (HCC) Vertigo Wears dentures     Reproductive/Obstetrics negative OB ROS                             Anesthesia Physical Anesthesia Plan  ASA: 3  Anesthesia Plan: General   Post-op Pain Management: Regional block   Induction: Intravenous  PONV Risk Score and Plan:   Airway Management Planned: LMA  Additional Equipment:   Intra-op Plan:   Post-operative Plan: Extubation in OR  Informed Consent: I have reviewed the patients History and Physical, chart, labs and discussed the procedure including the risks, benefits and alternatives for the proposed anesthesia with the patient or authorized representative who has indicated his/her understanding  and acceptance.     Dental Advisory Given  Plan Discussed with: Anesthesiologist, CRNA and Surgeon  Anesthesia Plan Comments: (Patient consented for risks of anesthesia including but not limited to:  - adverse reactions to medications - damage to eyes, teeth, lips or other oral mucosa - nerve damage due to positioning  - sore throat or hoarseness - Damage to heart, brain, nerves, lungs, other parts of body or loss of life  Patient voiced understanding.)       Anesthesia Quick Evaluation

## 2022-12-29 NOTE — Transfer of Care (Signed)
Immediate Anesthesia Transfer of Care Note  Patient: Cassandra Brandt  Procedure(s) Performed: OPEN REDUCTION INTERNAL FIXATION (ORIF) DISTAL RADIUS FRACTURE (Left: Wrist)  Patient Location: PACU  Anesthesia Type:General  Level of Consciousness: awake, alert , and oriented  Airway & Oxygen Therapy: Patient Spontanous Breathing  Post-op Assessment: Report given to RN  Post vital signs: Reviewed, stable, and unstable  Last Vitals:  Vitals Value Taken Time  BP 106/63 12/29/22 1424  Temp 36.2 C 12/29/22 1424  Pulse 80 12/29/22 1424  Resp 14 12/29/22 1424  SpO2 93 % 12/29/22 1424    Last Pain:  Vitals:   12/29/22 1424  TempSrc:   PainSc: 0-No pain         Complications: No notable events documented.

## 2022-12-29 NOTE — H&P (Signed)
Chief Complaint  Patient presents with   Left Wrist - Edema, Pain      Fall 12/27/22      History of the Present Illness: Cassandra Brandt is a 67 y.o. female here today who presents for evaluation of left distal radius fracture from the weekend of 12/25/2022. She is referred by [facility] emergency room.    The patient explains a fall injury from tripping her pet leash after ambulating at that night. She is able to have sensation and wiggle her fingers. She is right-hand dominant.    She was planning an appointment to address a trigger thumb.    Approximately, she smokes about a half a pack a day. Her pulmonary health is well, and she recently underwent a lung cancer screening, which yielded normal results.    She is retired but works part-time at [employer].   I have reviewed past medical, surgical, social and family history, and allergies as documented in the EMR.     Past Medical History: Past Medical History      Past Medical History:  Diagnosis Date   Anemia 03/03/2007   Chronic obstructive pulmonary disease, unspecified (CMS/HHS-HCC) 01/30/2021   Depression     Essential hypertension 01/30/2021   GERD (gastroesophageal reflux disease)     Osteoarthritis of hip 01/30/2021        Past Surgical History: Past Surgical History       Past Surgical History:  Procedure Laterality Date   CESAREAN SECTION   06/05/1978   ROUX-EN-Y PROCEDURE       TONSILLECTOMY   2015        Past Family History: Family History       Family History  Problem Relation Age of Onset   Anxiety Mother     Depression Mother     Diabetes type II Mother     Diabetes type II Sister     Diabetes type II Brother     High blood pressure (Hypertension) Brother         Medications: Current Medications        Current Outpatient Medications  Medication Sig Dispense Refill   albuterol MDI, PROVENTIL, VENTOLIN, PROAIR, HFA 90 mcg/actuation inhaler Inhale 2 Inhalations into the lungs every 6 (six)  hours as needed       alendronate (FOSAMAX) 70 MG tablet Take 70 mg by mouth every 7 (seven) days       amLODIPine (NORVASC) 5 MG tablet Take 5 mg by mouth once daily       aspirin 81 MG EC tablet Take 81 mg by mouth once daily       budesonide-glycopyrrolate-formoterol (BREZTRI AEROSPHERE) 160-9-4.8 mcg/actuation inhaler Inhale 2 inhalations into the lungs 2 (two) times daily       temazepam (RESTORIL) 30 mg capsule Take 30 mg by mouth at bedtime as needed for Sleep       CA CARB/VIT D3/B-12/FA/B6 (CALAFOL RX) 400-1.6 mg Tab 3 tab by mouth daily (Patient not taking: Reported on 12/28/2022)       cholecalciferol (VITAMIN D3) 2,000 unit capsule 1 capsule (Patient not taking: Reported on 12/28/2022)       citalopram (CELEXA) 40 MG tablet Take 40 mg by mouth once daily (Patient not taking: Reported on 12/28/2022)       doxepin (SINEQUAN) 25 MG capsule TAKE 1 CAPSULE(25 MG) BY MOUTH AT BEDTIME (Patient not taking: Reported on 12/28/2022) 30 capsule 8   escitalopram oxalate (LEXAPRO) 20  MG tablet Take 1 tablet (20 mg total) by mouth once daily (Patient not taking: Reported on 12/19/2021) 30 tablet 5   gabapentin (NEURONTIN) 400 MG capsule TAKE 1 CAPSULE(400 MG) BY MOUTH EVERY NIGHT (Patient not taking: Reported on 12/28/2022) 30 capsule 11   multivitamin capsule 1 cap by mouth daily (Patient not taking: Reported on 12/28/2022)       nitrofurantoin, macrocrystal-monohydrate, (MACROBID) 100 MG capsule  (Patient not taking: Reported on 12/19/2021)       rOPINIRole (REQUIP) 0.25 MG tablet TAKE 1 TABLET(0.25 MG) BY MOUTH AT BEDTIME FOR 7 DAYS THEN TAKE 2 TABLETS(0.5 MG) BY MOUTH AT BEDTIME FOR 21 DAYS (Patient not taking: Reported on 12/28/2022) 60 tablet 6   rOPINIRole (REQUIP) 0.5 MG tablet Take 1 tablet (0.5 mg total) by mouth at bedtime (Patient not taking: Reported on 12/28/2022) 90 tablet 3    No current facility-administered medications for this visit.        Allergies: Allergies      Allergies  Allergen  Reactions   Codeine Nausea   Levaquin [Levofloxacin] Unknown   Lisinopril Cough   Oxycodone-Acetaminophen Nausea   Penicillins Hives   Tramadol Nausea        Body mass index is 35.29 kg/m.     Review of Systems: A comprehensive 14 point ROS was performed, reviewed, and the pertinent orthopaedic findings are documented in the HPI.        Vitals:    12/28/22 1041  BP: (!) 144/86        General Physical Examination:  General/Constitutional: No apparent distress: well-nourished and well developed. Eyes: Pupils equal, round with synchronous movement. Lungs: Clear to auscultation HEENT: Normal Vascular: No edema, swelling or tenderness, except as noted in detailed exam. Cardiac: Heart rate and rhythm is regular. Integumentary: No impressive skin lesions present, except as noted in detailed exam. Neuro/Psych: Normal mood and affect, oriented to person, place, and time.   On exam, the patient has upper and lower dentures. Distant breath sounds in the lungs with some wheezing noted. Sensation is intact.     Radiographs: X-rays of the left wrist were reviewed from [facility] These show a displaced distal radius fracture with significant dorsal angulation.     Assessment:     ICD-10-CM  1. Closed Colles' fracture of left radius, initial encounter  S52.532A        Plan: The patient has clinic findings of a displaced distal radius fracture, exhibiting significant dorsal angulation.    We discussed an open reduction and internal fixation (ORIF) to facilitate the patient's continued independence in daily activities. She lives in [place], [place], however, her son will be accompanying her for transportation.   Operative options were discussed with the patient. The patient elected for surgical intervention. The patient was educated on risks and benefits, including but not limited to infection, nerve injury, vascular injury, wound healing issues, persistent pain, and the need for additional  procedures. Informed consent was obtained. The postoperative protocol was reviewed as well. Follow up 10-14 days after surgery.   The fracture will be treated via open reduction and internal fixation (ORIF)   The patient was instructed as to activity limitations and restrictions as well as indications for immediate follow-up should there be any pertinent signs of worsening of the condition or pain, complications regarding dressing, cast, or splint care, warranting immediate evaluation as indicated. In the event that narcotic medications have been prescribed, the patient has been advised as to the potential adverse effects  of their use and potential abuse and has been advised not to drive while using narcotic analgesic medications. The patient has been encouraged to contact the office or the on-call physician with any questions or concerns regarding the treatment plan.     Document Attestation: Alessandra Grout, have reviewed and updated documentation for [employer], MD, utilizing [employer].    Reviewed  H+P. No changes noted.

## 2022-12-30 ENCOUNTER — Encounter: Payer: Self-pay | Admitting: Orthopedic Surgery

## 2022-12-30 NOTE — Anesthesia Postprocedure Evaluation (Signed)
Anesthesia Post Note  Patient: Cassandra Brandt  Procedure(s) Performed: OPEN REDUCTION INTERNAL FIXATION (ORIF) DISTAL RADIUS FRACTURE (Left: Wrist)  Patient location during evaluation: PACU Anesthesia Type: General Level of consciousness: awake and alert Pain management: pain level controlled Vital Signs Assessment: post-procedure vital signs reviewed and stable Respiratory status: spontaneous breathing, nonlabored ventilation, respiratory function stable and patient connected to nasal cannula oxygen Cardiovascular status: blood pressure returned to baseline and stable Postop Assessment: no apparent nausea or vomiting Anesthetic complications: no   No notable events documented.   Last Vitals:  Vitals:   12/29/22 1424 12/29/22 1430  BP: 106/63 108/82  Pulse: 80 74  Resp: 14 14  Temp: (!) 36.2 C (!) 36.2 C  SpO2: 93% 96%    Last Pain:  Vitals:   12/29/22 1430  TempSrc:   PainSc: 0-No pain                 Elease Swarm C Darriana Deboy

## 2023-01-21 ENCOUNTER — Ambulatory Visit (INDEPENDENT_AMBULATORY_CARE_PROVIDER_SITE_OTHER): Payer: Medicare Other | Admitting: Physician Assistant

## 2023-01-21 VITALS — BP 130/77 | HR 84 | Temp 98.2°F | Wt 200.8 lb

## 2023-01-21 DIAGNOSIS — K119 Disease of salivary gland, unspecified: Secondary | ICD-10-CM

## 2023-01-21 DIAGNOSIS — R600 Localized edema: Secondary | ICD-10-CM

## 2023-01-21 NOTE — Patient Instructions (Addendum)
For now I recommend the following:  Try to eat sour candies and foods. Stay well hydrated keep the mouth moist with mouth washes like Biotene You can massage the salivary glands and apply warm compresses to the area to help increase flow and move the stone You can take Tylenol and Ibuprofen as needed for pain- you can alternate them every 4 hours for pain as needed

## 2023-01-21 NOTE — Progress Notes (Signed)
Acute Office Visit   Patient: Cassandra Brandt   DOB: [birth year]   67 y.o. Female  MRN: [id number] Visit Date: 01/21/2023  Today's healthcare provider: Oswaldo Conroy Maeva Dant, PA-C  Introduced myself to the patient as a Secondary school teacher and provided education on APPs in clinical practice.    Chief Complaint  Patient presents with   Facial Swelling    Pt states she has had swelling and pain under her L ear since last night. 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 States she was eating dinner last night and she felt a pop in the area of the pain and swelling.    Subjective    HPI HPI     Facial Swelling    Additional comments: Pt states she has had swelling and pain under her L ear since last night. 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 States she was eating dinner last night and she felt a pop in the area of the pain and swelling.       Last edited by Pablo Ledger, CMA on 01/21/2023  3:06 PM.       Facial swelling and left sided facial pain She states this is a recurrent issue she has had in the past and was diagnosed with salivary stones  Onset: sudden- started with a popping sensation while eating, then swelling started  Duration: started last night  Location: left ear and left jaw/neck  Radiation: none  Pain level and character: 8/10 in severity and dull in nature  Other associated symptoms: she denies a bad taste in her mouth, dental pain, bleeding in her mouth, fevers,  Interventions: Tylenol  Alleviating: nothing  Aggravating: eating makes it worse  She states in the past she has been given Abx for this  She reports that sour candies have not helped much in the past  She reports she smokes   Medications: Outpatient Medications Prior to Visit  Medication Sig   albuterol (VENTOLIN HFA) 108 (90 Base) MCG/ACT inhaler Inhale 2 puffs into the lungs every  6 (six) hours as needed for wheezing or shortness of breath.   alendronate (FOSAMAX) 70 MG tablet Take 1 tablet (70 mg total) by mouth every 7 (seven) days. Take with a full glass of water on an empty stomach.   amLODipine (NORVASC) 5 MG tablet Take 1 tablet (5 mg total) by mouth daily.   aspirin EC 81 MG tablet Take 81 mg by mouth daily. Swallow whole.   Budeson-Glycopyrrol-Formoterol (BREZTRI AEROSPHERE) 160-9-4.8 MCG/ACT AERO Inhale into the lungs 2 (two) times daily as needed.   citalopram (CELEXA) 40 MG tablet Take 1 tablet (40 mg total) by mouth daily.   ondansetron (ZOFRAN) 8 MG tablet Take 1 tablet (8 mg total) by mouth every 8 (eight) hours as needed for nausea or vomiting.   rosuvastatin (CRESTOR) 5 MG tablet Take 1 tablet (5 mg total) by mouth daily.   temazepam (RESTORIL) 30 MG capsule Take 1 capsule (30 mg total) by mouth at bedtime.   valsartan (DIOVAN) 160 MG tablet Take 1 tablet (160 mg total) by mouth daily.   [DISCONTINUED] HYDROcodone-acetaminophen (NORCO/VICODIN) 5-325 MG tablet Take 1 tablet by mouth every 4 (four) hours as needed for moderate pain. (Patient not taking: Reported on 01/21/2023)   [DISCONTINUED] ibuprofen (ADVIL) 800 MG tablet Take 1  tablet (800 mg total) by mouth every 8 (eight) hours as needed for moderate pain.   [DISCONTINUED] Tiotropium Bromide Monohydrate (SPIRIVA RESPIMAT) 2.5 MCG/ACT AERS Inhale 2 puffs into the lungs daily. (Patient not taking: Reported on 11/11/2022)   No facility-administered medications prior to visit.    Review of Systems  Musculoskeletal:  Positive for neck pain.       Neck swelling and pain over left parotid gland        Objective    BP 130/77   Pulse 84   Temp 98.2 F (36.8 C) (Oral)   Wt 200 lb 12.8 oz (91.1 kg)   PF 97 L/min   BMI 34.47 kg/m    Physical Exam Vitals reviewed.  Constitutional:      General: She is awake.     Appearance: Normal appearance. She is well-developed and well-groomed.  HENT:      Head: Normocephalic and atraumatic.     Jaw: Tenderness present.     Salivary Glands: Right salivary gland is not diffusely enlarged or tender. Left salivary gland is diffusely enlarged and tender.     Comments: Enlarged and tender left parotid gland. Left aspect of mandible and submandibular space is also tender and mildly swollen.     Mouth/Throat:     Lips: Pink.     Mouth: Mucous membranes are moist. No injury or oral lesions.     Dentition: Has dentures. No dental tenderness.     Tongue: No lesions.     Pharynx: Oropharynx is clear. Uvula midline. No oropharyngeal exudate or posterior oropharyngeal erythema.     Comments: Oral cavity is moist with clear saliva present. Interior of right cheek is normal in appearance without obvious swelling or parotid duct obstruction bilaterally No evidence of purulent saliva or drainage in mouth on exam   Neurological:     Mental Status: She is alert.  Psychiatric:        Behavior: Behavior is cooperative.       No results found for any visits on 01/21/23.  Assessment & Plan      No follow-ups on file.      Problem List Items Addressed This Visit   None Visit Diagnoses     Swelling of right parotid gland    -  Primary Acute, recurrent concern Patient reports swelling and tenderness over the left parotid gland since last night  She reports previous instance of this- reviewed her ED visit note from 03/21/2016 for confirmation  PE did not reveal increased warmth, redness, purulent drainage or obvious obstruction within oral cavity  I am less suspicious of true obstruction or infection at this time so recommend conservative management  Recommend sour candies and foods to encourage salivary production, warm compresses to the area, parotid massage. Will place referral to ENT as this is recurrent concern Reviewed return precautions, follow up as needed.    Relevant Orders   Ambulatory referral to ENT   Discomfort of parotid gland        Relevant Orders   Ambulatory referral to ENT        No follow-ups on file.   I, Vincenta Steffey E Jennifer Payes, PA-C, have reviewed all documentation for this visit. The documentation on 01/22/23 for the exam, diagnosis, procedures, and orders are all accurate and complete.   Jacquelin Hawking, MHS, PA-C [facility]

## 2023-01-25 ENCOUNTER — Encounter: Payer: Self-pay | Admitting: Nurse Practitioner

## 2023-01-25 DIAGNOSIS — G8929 Other chronic pain: Secondary | ICD-10-CM

## 2023-02-02 ENCOUNTER — Other Ambulatory Visit: Payer: Self-pay | Admitting: Nurse Practitioner

## 2023-02-02 NOTE — Telephone Encounter (Signed)
Requested Prescriptions  Refused Prescriptions Disp Refills   citalopram (CELEXA) 40 MG tablet [Pharmacy Med Name: CITALOPRAM HBR 40 MG TABLET] 90 tablet 0    Sig: Take 1 tablet (40 mg total) by mouth daily.     Psychiatry:  Antidepressants - SSRI Passed - 02/02/2023 10:30 AM      Passed - Valid encounter within last 6 months    Recent Outpatient Visits           1 week ago Swelling of right parotid gland   [place] [facility] [place], Oswaldo Conroy, PA-C   2 months ago [facility] [employer] preventive visit   [facility] [place], Clydie Braun, NP   3 months ago Chronic obstructive pulmonary disease, unspecified COPD type [facility])   [place] [facility] Larae Grooms, NP   5 months ago Annual physical exam   [place] [facility] Larae Grooms, NP   6 months ago Primary insomnia   [place] [facility] Larae Grooms, NP       Future Appointments             In 1 week Larae Grooms, NP  [facility]

## 2023-02-11 ENCOUNTER — Encounter: Payer: Self-pay | Admitting: Nurse Practitioner

## 2023-02-11 ENCOUNTER — Ambulatory Visit (INDEPENDENT_AMBULATORY_CARE_PROVIDER_SITE_OTHER): Payer: Medicare Other | Admitting: Nurse Practitioner

## 2023-02-11 VITALS — BP 135/83 | HR 88 | Temp 98.7°F | Wt 198.4 lb

## 2023-02-11 DIAGNOSIS — D509 Iron deficiency anemia, unspecified: Secondary | ICD-10-CM

## 2023-02-11 DIAGNOSIS — F419 Anxiety disorder, unspecified: Secondary | ICD-10-CM | POA: Diagnosis not present

## 2023-02-11 DIAGNOSIS — J449 Chronic obstructive pulmonary disease, unspecified: Secondary | ICD-10-CM | POA: Diagnosis not present

## 2023-02-11 DIAGNOSIS — E559 Vitamin D deficiency, unspecified: Secondary | ICD-10-CM

## 2023-02-11 DIAGNOSIS — I10 Essential (primary) hypertension: Secondary | ICD-10-CM

## 2023-02-11 DIAGNOSIS — F5101 Primary insomnia: Secondary | ICD-10-CM

## 2023-02-11 MED ORDER — ROSUVASTATIN CALCIUM 5 MG PO TABS: 5.0000 mg | ORAL_TABLET | Freq: Every day | ORAL | 1 refills | Status: DC

## 2023-02-11 MED ORDER — AMLODIPINE BESYLATE 5 MG PO TABS: 5.0000 mg | ORAL_TABLET | Freq: Every day | ORAL | 1 refills | Status: DC

## 2023-02-11 MED ORDER — CITALOPRAM HYDROBROMIDE 40 MG PO TABS: 40.0000 mg | ORAL_TABLET | Freq: Every day | ORAL | 1 refills | Status: DC

## 2023-02-11 MED ORDER — TEMAZEPAM 30 MG PO CAPS: 30.0000 mg | ORAL_CAPSULE | Freq: Every day | ORAL | 4 refills | Status: DC

## 2023-02-11 MED ORDER — VALSARTAN 160 MG PO TABS: 160.0000 mg | ORAL_TABLET | Freq: Every day | ORAL | 1 refills | Status: DC

## 2023-02-11 MED ORDER — CYCLOBENZAPRINE HCL 5 MG PO TABS: 5.0000 mg | ORAL_TABLET | Freq: Three times a day (TID) | ORAL | 3 refills | Status: DC | PRN

## 2023-02-11 NOTE — Assessment & Plan Note (Signed)
Chronic. Controlled.  Continue to check blood pressures at home.  Continue with Valsartan and Amlodipine.  Follow up in 4 months.  Call sooner if concerns arise.

## 2023-02-11 NOTE — Assessment & Plan Note (Signed)
Chronic.  Controlled.  Continue with current medication regimen.  Labs ordered today.  Return to clinic in 6 months for reevaluation.  Call sooner if concerns arise.  ? ?

## 2023-02-11 NOTE — Assessment & Plan Note (Addendum)
Chronic.  Controlled.  Continue with current medication regimen of Celexa 40mg  daily.  Labs ordered today.  Return to clinic in 4 months for reevaluation.  Call sooner if concerns arise.

## 2023-02-11 NOTE — Assessment & Plan Note (Signed)
Chronic. Controlled with Temazepam.  Patient aware of risks of taking medication and okay with continuing.  UDS and controlled substance agreement are up to date.  Refills sent today.  Side effects and benefits of medication discussed during visit.  Follow up in 4 months.

## 2023-02-11 NOTE — Assessment & Plan Note (Signed)
Labs ordered at visit today.  Will make recommendations based on lab results.   

## 2023-02-11 NOTE — Assessment & Plan Note (Signed)
Chronic. Well controlled.  CCM was successful at getting Breztri via PAP.  Using Albuterol PRN.  Albuterol given at visit today.  Follow up in 4 months.  Call sooner if concerns arise.

## 2023-02-11 NOTE — Progress Notes (Signed)
BP 135/83   Pulse 88   Temp 98.7 F (37.1 C) (Oral)   Wt 198 lb 6.4 oz (90 kg)   SpO2 96%   BMI 34.06 kg/m    Subjective:    Patient ID: [name], female    DOB: Nov 19, 1955, 67 y.o.   MRN: [id number]  HPI: LACHE DAGHER is a 68 y.o. female  Chief Complaint  Patient presents with   COPD   Hypertension   Insomnia   HYPERTENSION without Chronic Kidney Disease Hypertension status: uncontrolled  Satisfied with current treatment? no Duration of hypertension: years BP monitoring frequency:  daily BP range: 120-130/80 BP medication side effects:  no Medication compliance: excellent compliance Previous BP meds:None Aspirin: no Recurrent headaches: yes Visual changes: no Palpitations: no Dyspnea: yes Chest pain: no Lower extremity edema: no Dizzy/lightheaded: no  COPD Patient has not gotten Breztri via patient assistance.  She has decreased her use of albuterol.  Feels like it is working well.  COPD status: controlled Satisfied with current treatment?: yes Oxygen use: no Dyspnea frequency: on cold days  Cough frequency:  Rescue inhaler frequency:   Limitation of activity: some Productive cough:  Last Spirometry: needs in the future Pneumovax: Up to Date Influenza: Up to Date  ANEMIA Anemia status: controlled Etiology of anemia: Duration of anemia treatment:  Compliance with treatment: excellent compliance Iron supplementation side effects: no Severity of anemia: mild Fatigue: no Decreased exercise tolerance: no  Dyspnea on exertion: no Palpitations: no Bleeding: no Pica: no  Relevant past medical, surgical, family and social history reviewed and updated as indicated. Interim medical history since our last visit reviewed. Allergies and medications reviewed and updated.  Review of Systems  Eyes:  Negative for visual disturbance.  Respiratory:  Negative for cough, chest tightness and shortness of breath.   Cardiovascular:  Negative for chest pain,  palpitations and leg swelling.  Neurological:  Negative for dizziness and headaches.  Psychiatric/Behavioral:  Positive for sleep disturbance.     Per HPI unless specifically indicated above     Objective:    BP 135/83   Pulse 88   Temp 98.7 F (37.1 C) (Oral)   Wt 198 lb 6.4 oz (90 kg)   SpO2 96%   BMI 34.06 kg/m   Wt Readings from Last 3 Encounters:  02/11/23 198 lb 6.4 oz (90 kg)  01/21/23 200 lb 12.8 oz (91.1 kg)  12/29/22 204 lb (92.5 kg)    Physical Exam Vitals and nursing note reviewed.  Constitutional:      General: She is not in acute distress.    Appearance: Normal appearance. She is not ill-appearing, toxic-appearing or diaphoretic.  HENT:     Head: Normocephalic.     Right Ear: External ear normal.     Left Ear: External ear normal.     Nose: Nose normal.     Mouth/Throat:     Mouth: Mucous membranes are moist.     Pharynx: Oropharynx is clear.  Eyes:     General:        Right eye: No discharge.        Left eye: No discharge.     Extraocular Movements: Extraocular movements intact.     Conjunctiva/sclera: Conjunctivae normal.     Pupils: Pupils are equal, round, and reactive to light.  Cardiovascular:     Rate and Rhythm: Normal rate and regular rhythm.     Heart sounds: No murmur heard. Pulmonary:     Effort: Pulmonary  effort is normal. No respiratory distress.     Breath sounds: Normal breath sounds. No wheezing or rales.  Musculoskeletal:     Cervical back: Normal range of motion and neck supple.  Skin:    General: Skin is warm and dry.     Capillary Refill: Capillary refill takes less than 2 seconds.  Neurological:     General: No focal deficit present.     Mental Status: She is alert and oriented to person, place, and time. Mental status is at baseline.  Psychiatric:        Mood and Affect: Mood normal.        Behavior: Behavior normal.        Thought Content: Thought content normal.        Judgment: Judgment normal.     Results for  orders placed or performed in visit on 11/11/22  Comp Met (CMET)  Result Value Ref Range   Glucose 84 70 - 99 mg/dL   BUN 10 8 - 27 mg/dL   Creatinine, Ser 1.61 0.57 - 1.00 mg/dL   eGFR 96 >09 UE/AVW/0.98   BUN/Creatinine Ratio 15 12 - 28   Sodium 140 134 - 144 mmol/L   Potassium 4.2 3.5 - 5.2 mmol/L   Chloride 100 96 - 106 mmol/L   CO2 25 20 - 29 mmol/L   Calcium 9.3 8.7 - 10.3 mg/dL   Total Protein 6.9 6.0 - 8.5 g/dL   Albumin 4.7 3.9 - 4.9 g/dL   Globulin, Total 2.2 1.5 - 4.5 g/dL   Albumin/Globulin Ratio 2.1 1.2 - 2.2   Bilirubin Total 0.3 0.0 - 1.2 mg/dL   Alkaline Phosphatase 100 44 - 121 IU/L   AST 15 0 - 40 IU/L   ALT 10 0 - 32 IU/L  CBC w/Diff  Result Value Ref Range   WBC 8.3 3.4 - 10.8 x10E3/uL   RBC 5.19 3.77 - 5.28 x10E6/uL   Hemoglobin 14.9 11.1 - 15.9 g/dL   Hematocrit 11.9 (H) 14.7 - 46.6 %   MCV 92 79 - 97 fL   MCH 28.7 26.6 - 33.0 pg   MCHC 31.4 (L) 31.5 - 35.7 g/dL   RDW 82.9 56.2 - 13.0 %   Platelets 275 150 - 450 x10E3/uL   Neutrophils 63 Not Estab. %   Lymphs 30 Not Estab. %   Monocytes 5 Not Estab. %   Eos 1 Not Estab. %   Basos 1 Not Estab. %   Neutrophils Absolute 5.3 1.4 - 7.0 x10E3/uL   Lymphocytes Absolute 2.4 0.7 - 3.1 x10E3/uL   Monocytes Absolute 0.4 0.1 - 0.9 x10E3/uL   EOS (ABSOLUTE) 0.1 0.0 - 0.4 x10E3/uL   Basophils Absolute 0.1 0.0 - 0.2 x10E3/uL   Immature Granulocytes 0 Not Estab. %   Immature Grans (Abs) 0.0 0.0 - 0.1 x10E3/uL  Vitamin D (25 hydroxy)  Result Value Ref Range   Vit D, 25-Hydroxy 45.9 30.0 - 100.0 ng/mL      Assessment & Plan:   Problem List Items Addressed This Visit       Cardiovascular and Mediastinum   Essential hypertension - Primary    Chronic. Controlled.  Continue to check blood pressures at home.  Continue with Valsartan and Amlodipine.  Follow up in 4 months.  Call sooner if concerns arise.       Relevant Medications   amLODipine (NORVASC) 5 MG tablet   rosuvastatin (CRESTOR) 5 MG tablet    valsartan (DIOVAN) 160 MG tablet  Other Relevant Orders   Comp Met (CMET)     Respiratory   Chronic obstructive pulmonary disease, unspecified (HCC)    Chronic. Well controlled.  CCM was successful at getting Breztri via PAP.  Using Albuterol PRN.  Albuterol given at visit today.  Follow up in 4 months.  Call sooner if concerns arise.         Other   Anxiety    Chronic.  Controlled.  Continue with current medication regimen of Celexa 40mg  daily.  Labs ordered today.  Return to clinic in 4 months for reevaluation.  Call sooner if concerns arise.        Relevant Medications   citalopram (CELEXA) 40 MG tablet   Iron deficiency anemia    Chronic.  Controlled.  Continue with current medication regimen.  Labs ordered today.  Return to clinic in 6 months for reevaluation.  Call sooner if concerns arise.        Relevant Orders   CBC w/Diff   Primary insomnia    Chronic. Controlled with Temazepam.  Patient aware of risks of taking medication and okay with continuing.  UDS and controlled substance agreement are up to date.  Refills sent today.  Side effects and benefits of medication discussed during visit.  Follow up in 4 months.      Vitamin D deficiency    Labs ordered at visit today.  Will make recommendations based on lab results.        Relevant Orders   Vitamin D (25 hydroxy)     Follow up plan: Return in about 4 months (around 06/13/2023) for HTN, HLD, DM2 FU.

## 2023-02-12 ENCOUNTER — Encounter: Payer: Self-pay | Admitting: Nurse Practitioner

## 2023-02-12 LAB — COMPREHENSIVE METABOLIC PANEL
ALT: 12 IU/L (ref 0–32)
AST: 14 IU/L (ref 0–40)
Albumin/Globulin Ratio: 2.1
Albumin: 4 g/dL (ref 3.9–4.9)
Alkaline Phosphatase: 92 IU/L (ref 44–121)
BUN/Creatinine Ratio: 18 (ref 12–28)
BUN: 14 mg/dL (ref 8–27)
Bilirubin Total: 0.3 mg/dL (ref 0.0–1.2)
CO2: 23 mmol/L (ref 20–29)
Calcium: 9.4 mg/dL (ref 8.7–10.3)
Chloride: 101 mmol/L (ref 96–106)
Creatinine, Ser: 0.77 mg/dL (ref 0.57–1.00)
Globulin, Total: 1.9 g/dL (ref 1.5–4.5)
Glucose: 83 mg/dL (ref 70–99)
Potassium: 4 mmol/L (ref 3.5–5.2)
Sodium: 141 mmol/L (ref 134–144)
Total Protein: 5.9 g/dL — ABNORMAL LOW (ref 6.0–8.5)
eGFR: 84 mL/min/{1.73_m2} (ref >59)

## 2023-02-12 LAB — CBC WITH DIFFERENTIAL/PLATELET
Basophils Absolute: 0 10*3/uL (ref 0.0–0.2)
Basos: 0 %
EOS (ABSOLUTE): 0.2 10*3/uL (ref 0.0–0.4)
Eos: 2 %
Hematocrit: 43.7 % (ref 34.0–46.6)
Hemoglobin: 14.1 g/dL (ref 11.1–15.9)
Immature Grans (Abs): 0.1 10*3/uL (ref 0.0–0.1)
Immature Granulocytes: 1 %
Lymphocytes Absolute: 3.3 10*3/uL — ABNORMAL HIGH (ref 0.7–3.1)
Lymphs: 33 %
MCH: 29.6 pg (ref 26.6–33.0)
MCHC: 32.3 g/dL (ref 31.5–35.7)
MCV: 92 fL (ref 79–97)
Monocytes Absolute: 0.5 10*3/uL (ref 0.1–0.9)
Monocytes: 5 %
Neutrophils Absolute: 5.8 10*3/uL (ref 1.4–7.0)
Neutrophils: 59 %
Platelets: 271 10*3/uL (ref 150–450)
RBC: 4.77 x10E6/uL (ref 3.77–5.28)
RDW: 14.5 % (ref 11.7–15.4)
WBC: 9.8 10*3/uL (ref 3.4–10.8)

## 2023-02-12 LAB — VITAMIN D 25 HYDROXY (VIT D DEFICIENCY, FRACTURES): Vit D, 25-Hydroxy: 28.1 ng/mL — ABNORMAL LOW (ref 30.0–100.0)

## 2023-02-12 NOTE — Progress Notes (Signed)
Hi [name]. It was nice to see you yesterday.  Your lab work looks good.  I recommend taking vitamin D 1000 IU once daily to help with your vitamin D level. No concerns at this time. Continue with your current medication regimen.  Follow up as discussed.  Please let me know if you have any questions.

## 2023-02-15 ENCOUNTER — Encounter: Payer: Self-pay | Admitting: Nurse Practitioner

## 2023-03-09 ENCOUNTER — Other Ambulatory Visit: Payer: Self-pay | Admitting: Orthopedic Surgery

## 2023-03-10 ENCOUNTER — Encounter: Payer: Self-pay | Admitting: Orthopedic Surgery

## 2023-03-11 ENCOUNTER — Encounter: Payer: Self-pay | Admitting: Orthopedic Surgery

## 2023-03-11 ENCOUNTER — Other Ambulatory Visit: Payer: Self-pay | Admitting: Unknown Physician Specialty

## 2023-03-11 DIAGNOSIS — K1120 Sialoadenitis, unspecified: Secondary | ICD-10-CM

## 2023-03-11 NOTE — Anesthesia Preprocedure Evaluation (Addendum)
Anesthesia Evaluation  Patient identified by MRN, date of birth, ID band Patient awake    Reviewed: Allergy & Precautions, H&P , NPO status , Patient's Chart, lab work & pertinent test results  Airway Mallampati: II  TM Distance: <3 FB Neck ROM: Full    Dental no notable dental hx. (+) Edentulous Upper   Pulmonary COPD, Current Smoker and Patient abstained from smoking. Office note 12-01-22 Calculation 63 pack year smoking history (# packs/per year x # years smoked) CT 12/02/22 "mild" emphysema   Pulmonary exam normal breath sounds clear to auscultation       Cardiovascular hypertension, + CAD  Normal cardiovascular exam Rhythm:Regular Rate:Normal  12-02-22 ardiovascular: Heart size is normal. There is no significant  pericardial fluid, thickening or pericardial calcification. There is  aortic atherosclerosis, as well as atherosclerosis of the great  vessels of the mediastinum and the coronary arteries, including  calcified atherosclerotic plaque in the left anterior descending,  left circumflex and right coronary arteries. Calcifications of the  mitral annulus.     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 Aortic atherosclerosis.    Endo/Other  negative endocrine ROS    Renal/GU negative Renal ROS  negative genitourinary   Musculoskeletal negative musculoskeletal ROS (+)    Abdominal   Peds negative pediatric ROS (+)  Hematology  (+) Blood dyscrasia, anemia   Anesthesia Other Findings Hypertension  GERD (gastroesophageal reflux disease) COPD (chronic obstructive pulmonary disease) Vertigo Wears dentures  Hx Roux-En-Y gastric bypass Restless Leg Syndrome  Discussed postop pain meds with Dr. Rosita Kea. He states patient has had norco  (hydrocodone) before without any difficulty.   Reproductive/Obstetrics negative OB ROS                             Anesthesia Physical Anesthesia Plan  ASA: 3  Anesthesia Plan:    Post-op Pain Management:    Induction: Intravenous  PONV Risk Score and Plan:   Airway Management Planned: LMA  Additional Equipment:   Intra-op Plan:   Post-operative Plan: Extubation in OR  Informed Consent: I have reviewed the patients History and Physical, chart, labs and discussed the procedure including the risks, benefits and alternatives for the proposed anesthesia with the patient or authorized representative who has indicated his/her understanding and acceptance.     Dental Advisory Given  Plan Discussed with: Anesthesiologist, CRNA and Surgeon  Anesthesia Plan Comments: (Patient consented for risks of anesthesia including but not limited to:  - adverse reactions to medications - damage to eyes, teeth, lips or other oral mucosa - nerve damage due to positioning  - sore throat or hoarseness - Damage to heart, brain, nerves, lungs, other parts of body or loss of life  Patient voiced understanding.)        Anesthesia Quick Evaluation

## 2023-03-25 ENCOUNTER — Other Ambulatory Visit: Payer: Self-pay | Admitting: Physical Medicine & Rehabilitation

## 2023-03-25 DIAGNOSIS — M5441 Lumbago with sciatica, right side: Secondary | ICD-10-CM

## 2023-03-30 ENCOUNTER — Ambulatory Visit: Payer: Medicare Other | Admitting: Anesthesiology

## 2023-03-30 ENCOUNTER — Encounter
Admission: RE | Disposition: A | Discharge status: Home / Self Care | Payer: Self-pay | Source: Home / Self Care | Attending: Orthopedic Surgery

## 2023-03-30 ENCOUNTER — Other Ambulatory Visit: Payer: Self-pay

## 2023-03-30 ENCOUNTER — Ambulatory Visit
Admission: RE | Admit: 2023-03-30 | Discharge: 2023-03-30 | Disposition: A | Discharge status: Home / Self Care | Payer: Medicare Other | Attending: Orthopedic Surgery | Admitting: Orthopedic Surgery

## 2023-03-30 DIAGNOSIS — I2510 Atherosclerotic heart disease of native coronary artery without angina pectoris: Secondary | ICD-10-CM | POA: Insufficient documentation

## 2023-03-30 DIAGNOSIS — Z9884 Bariatric surgery status: Secondary | ICD-10-CM | POA: Diagnosis not present

## 2023-03-30 DIAGNOSIS — Y798 Miscellaneous orthopedic devices associated with adverse incidents, not elsewhere classified: Secondary | ICD-10-CM | POA: Insufficient documentation

## 2023-03-30 DIAGNOSIS — F17200 Nicotine dependence, unspecified, uncomplicated: Secondary | ICD-10-CM | POA: Diagnosis not present

## 2023-03-30 DIAGNOSIS — I10 Essential (primary) hypertension: Secondary | ICD-10-CM | POA: Insufficient documentation

## 2023-03-30 DIAGNOSIS — K219 Gastro-esophageal reflux disease without esophagitis: Secondary | ICD-10-CM | POA: Insufficient documentation

## 2023-03-30 DIAGNOSIS — I700 Atherosclerosis of aorta: Secondary | ICD-10-CM | POA: Insufficient documentation

## 2023-03-30 DIAGNOSIS — T8484XA Pain due to internal orthopedic prosthetic devices, implants and grafts, initial encounter: Secondary | ICD-10-CM | POA: Diagnosis present

## 2023-03-30 DIAGNOSIS — J449 Chronic obstructive pulmonary disease, unspecified: Secondary | ICD-10-CM | POA: Insufficient documentation

## 2023-03-30 HISTORY — DX: Restless legs syndrome: G25.81

## 2023-03-30 HISTORY — DX: Bariatric surgery status: Z98.84

## 2023-03-30 HISTORY — DX: Atherosclerotic heart disease of native coronary artery without angina pectoris: I25.10

## 2023-03-30 HISTORY — PX: HARDWARE REMOVAL: SHX979

## 2023-03-30 SURGERY — REMOVAL, HARDWARE
Anesthesia: Choice | Laterality: Left

## 2023-03-30 MED ORDER — LACTATED RINGERS IV SOLN: INTRAVENOUS | Status: DC

## 2023-03-30 MED ORDER — 0.9 % SODIUM CHLORIDE (POUR BTL) OPTIME
TOPICAL | Status: DC | PRN
  Administered 2023-03-30: 1000 mL

## 2023-03-30 MED ORDER — EPHEDRINE SULFATE (PRESSORS) 50 MG/ML IJ SOLN
INTRAMUSCULAR | Status: DC | PRN
  Administered 2023-03-30 (×2): 10 mg via INTRAVENOUS

## 2023-03-30 MED ORDER — LIDOCAINE HCL (CARDIAC) PF 100 MG/5ML IV SOSY
PREFILLED_SYRINGE | INTRAVENOUS | Status: DC | PRN
  Administered 2023-03-30: 100 mg via INTRAVENOUS

## 2023-03-30 MED ORDER — BUPIVACAINE HCL 0.5 % IJ SOLN
INTRAMUSCULAR | Status: DC | PRN
  Administered 2023-03-30: 10 mL

## 2023-03-30 MED ORDER — PROPOFOL 10 MG/ML IV BOLUS
INTRAVENOUS | Status: DC | PRN
Start: 2023-03-30 — End: 2023-03-30
  Administered 2023-03-30: 150 mg via INTRAVENOUS

## 2023-03-30 MED ORDER — ONDANSETRON HCL 4 MG/2ML IJ SOLN
INTRAMUSCULAR | Status: DC | PRN
  Administered 2023-03-30: 4 mg via INTRAVENOUS

## 2023-03-30 MED ORDER — CEFAZOLIN SODIUM-DEXTROSE 1-4 GM/50ML-% IV SOLN
INTRAVENOUS | Status: DC | PRN
  Administered 2023-03-30: 2 g via INTRAVENOUS

## 2023-03-30 MED ORDER — CEFAZOLIN SODIUM-DEXTROSE 2-4 GM/100ML-% IV SOLN: 2.0000 g | INTRAVENOUS | Status: DC

## 2023-03-30 MED ORDER — FENTANYL CITRATE (PF) 100 MCG/2ML IJ SOLN
INTRAMUSCULAR | Status: DC | PRN
  Administered 2023-03-30: 100 ug via INTRAVENOUS

## 2023-03-30 MED ORDER — HYDROCODONE-ACETAMINOPHEN 5-325 MG PO TABS
1.0000 | ORAL_TABLET | ORAL | Status: DC | PRN
  Administered 2023-03-30: 1 via ORAL

## 2023-03-30 MED ORDER — HYDROCODONE-ACETAMINOPHEN 5-325 MG PO TABS: 1.0000 | ORAL_TABLET | ORAL | 0 refills | Status: DC | PRN

## 2023-03-30 MED ORDER — ONDANSETRON HCL 4 MG PO TABS: 4.0000 mg | ORAL_TABLET | Freq: Three times a day (TID) | ORAL | 0 refills | Status: DC | PRN

## 2023-03-30 SURGICAL SUPPLY — 18 items
APL PRP STRL LF DISP 70% ISPRP (MISCELLANEOUS) ×1
BNDG CMPR STD VLCR NS LF 5.8X3 (GAUZE/BANDAGES/DRESSINGS) ×1
BNDG ELASTIC 3X5.8 VLCR NS LF (GAUZE/BANDAGES/DRESSINGS) IMPLANT
CHLORAPREP W/TINT 26 (MISCELLANEOUS) ×1 IMPLANT
COVER LIGHT HANDLE UNIVERSAL (MISCELLANEOUS) ×2 IMPLANT
ELECT REM PT RETURN 9FT ADLT (ELECTROSURGICAL) ×1
ELECTRODE REM PT RTRN 9FT ADLT (ELECTROSURGICAL) ×1 IMPLANT
GAUZE SPONGE 4X4 12PLY STRL (GAUZE/BANDAGES/DRESSINGS) ×1 IMPLANT
GAUZE XEROFORM 1X8 LF (GAUZE/BANDAGES/DRESSINGS) ×1 IMPLANT
GLOVE SURG SYN 9.0 PF PI (GLOVE) ×1 IMPLANT
GOWN STRL REIN 2XL XLG LVL4 (GOWN DISPOSABLE) ×1 IMPLANT
GOWN STRL REUS W/ TWL LRG LVL3 (GOWN DISPOSABLE) ×1 IMPLANT
GOWN STRL REUS W/TWL LRG LVL3 (GOWN DISPOSABLE) ×1
KIT TURNOVER KIT A (KITS) ×1 IMPLANT
NS IRRIG 500ML POUR BTL (IV SOLUTION) ×1 IMPLANT
PACK EXTREMITY ARMC (MISCELLANEOUS) ×1 IMPLANT
SUT VIC AB 3-0 SH 27 (SUTURE) ×1
SUT VIC AB 3-0 SH 27X BRD (SUTURE) IMPLANT

## 2023-03-30 NOTE — Anesthesia Procedure Notes (Signed)
Procedure Name: LMA Insertion Date/Time: 03/30/2023 8:23 AM  Performed by: Barbette Hair, CRNAPre-anesthesia Checklist: Patient identified, Emergency Drugs available, Suction available, Patient being monitored and Timeout performed Patient Re-evaluated:Patient Re-evaluated prior to induction Oxygen Delivery Method: Circle system utilized Preoxygenation: Pre-oxygenation with 100% oxygen Induction Type: IV induction LMA: LMA inserted LMA Size: 4.0 Number of attempts: 1 Placement Confirmation: positive ETCO2 Dental Injury: Teeth and Oropharynx as per pre-operative assessment

## 2023-03-30 NOTE — Transfer of Care (Signed)
Immediate Anesthesia Transfer of Care Note  Patient: Cassandra Brandt  Procedure(s) Performed: Left distal radius DVR plate, hardware, removal (Left)  Patient Location: PACU  Anesthesia Type: No value filed.  Level of Consciousness: awake, alert  and patient cooperative  Airway and Oxygen Therapy: Patient Spontanous Breathing and Patient connected to supplemental oxygen  Post-op Assessment: Post-op Vital signs reviewed, Patient's Cardiovascular Status Stable, Respiratory Function Stable, Patent Airway and No signs of Nausea or vomiting  Post-op Vital Signs: Reviewed and stable  Complications: No notable events documented.

## 2023-03-30 NOTE — Anesthesia Postprocedure Evaluation (Signed)
Anesthesia Post Note  Patient: Cassandra Brandt  Procedure(s) Performed: Left distal radius DVR plate, hardware, removal (Left)  Patient location during evaluation: PACU Anesthesia Type: General Level of consciousness: awake and alert Pain management: pain level controlled Vital Signs Assessment: post-procedure vital signs reviewed and stable Respiratory status: spontaneous breathing, nonlabored ventilation, respiratory function stable and patient connected to nasal cannula oxygen Cardiovascular status: blood pressure returned to baseline and stable Postop Assessment: no apparent nausea or vomiting Anesthetic complications: no   No notable events documented.   Last Vitals:  Vitals:   03/30/23 0905 03/30/23 0914  BP: 133/77 120/75  Pulse: 86 84  Resp: 19 15  Temp:  (!) 36.3 C  SpO2: 97% 94%    Last Pain:  Vitals:   03/30/23 0914  TempSrc:   PainSc: 6                  Ammiel Guiney C Novella Abraha

## 2023-03-30 NOTE — Op Note (Signed)
03/30/2023  8:56 AM  PATIENT:  Cassandra Brandt  67 y.o. female  PRE-OPERATIVE DIAGNOSIS:  Painful orthopaedic hardware T84.84XA S/P ORIF open reduction internal fixation fracture Z98.890, Z87.81  POST-OPERATIVE DIAGNOSIS:  Painful orthopaedic hardware T84.84XAS/P ORIF open reduction internal fixation fracture Z98.890, Z87.81  PROCEDURE:  Procedure(s): Left distal radius DVR plate, hardware, removal (Left)  SURGEON: Leitha Schuller, MD  ASSISTANTS: None  ANESTHESIA:   general  EBL:  Total I/O In: 500 [I.V.:500] Out: 5 [Blood:5]  BLOOD ADMINISTERED:none  DRAINS: none   LOCAL MEDICATIONS USED:  MARCAINE    and Amount: 10 ml  SPECIMEN:  No Specimen  DISPOSITION OF SPECIMEN:  N/A  COUNTS:  YES  TOURNIQUET:   Total Tourniquet Time Documented: Upper Arm (Left) - 18 minutes Total: Upper Arm (Left) - 18 minutes   IMPLANTS: None  DICTATION: .Dragon Dictation patient was brought to the operating room and after adequate anesthesia was obtained the left arm was prepped and draped in usual sterile fashion.  Prior to this after appropriate patient identification timeout procedure the skin was prepped with alcohol and 10 cc half percent Sensorcaine plain was of treated the area of planned incision.  After prepping and draping the tourniquet was raised and the prior incision was opened with subcutaneous tissue spread with meticulous hemostasis with electrocautery.  The FCR tendon was identified and retracted radially protecting radial artery and associated veins and the plate easily exposed.  The proximal large screw was removed and following this the distal plate was exposed and all 6 pegs were removed without difficulty plate came out without any problems the wound was irrigated there were appeared to be a healed fracture no complication deep to the plate the wound was then closed using 3-0 Vicryl subcutaneously 4-0 nylon for the skin followed by Xeroform 4 x 4 web roll and Ace wrap and  tourniquet let down.  PLAN OF CARE: Discharge to home after PACU  PATIENT DISPOSITION:  PACU - hemodynamically stable.

## 2023-03-30 NOTE — H&P (Signed)
Chief Complaint Patient presents with Left Wrist - Post Operative Visit, Pain, Numbness   History of the Present Illness: Cassandra Brandt is a 67 y.o. female here today.  The patient is a 66 year old female who is postop from ORIF of the right wrist from 12/29/2022. She is having some burning pain, swelling, and other problems with her wrist and around her incision. She returns today for recheck.  The patient has tenderness to the wrist, and reports experiencing significant burning pain in her right wrist, accompanied by swelling. She notes she has a hairline fracture in her left wrist, which occasionally swells. She experiences severe pain during certain finger movements. The sensitivity in her wrist is so severe that it impedes her ability to lay it on her shirt. She also reports a clicking sensation in her wrist.  I have reviewed past medical, surgical, social and family history, and allergies as documented in the EMR.  Past Medical History: Past Medical History: Diagnosis Date Anemia 03/03/2007 Chronic obstructive pulmonary disease, unspecified (CMS/HHS-HCC) 01/30/2021 Depression Essential hypertension 01/30/2021 GERD (gastroesophageal reflux disease) Osteoarthritis of hip 01/30/2021  Past Surgical History: Past Surgical History: Procedure Laterality Date CESAREAN SECTION 06/05/1978 ROUX-EN-Y PROCEDURE TONSILLECTOMY 2015  Past Family History: Family History Problem Relation Age of Onset Anxiety Mother Depression Mother Diabetes type II Mother Diabetes type II Sister Diabetes type II Brother High blood pressure (Hypertension) Brother  Medications: Current Outpatient Medications Medication Sig Dispense Refill albuterol MDI, PROVENTIL, VENTOLIN, PROAIR, HFA 90 mcg/actuation inhaler Inhale 2 Inhalations into the lungs every 6 (six) hours as needed cyclobenzaprine (FLEXERIL) 5 MG tablet Take 5 mg by mouth 3 (three) times daily as needed for Muscle spasms rosuvastatin (CRESTOR) 5 MG  tablet Take 5 mg by mouth once daily alendronate (FOSAMAX) 70 MG tablet Take 70 mg by mouth every 7 (seven) days amLODIPine (NORVASC) 5 MG tablet Take 5 mg by mouth once daily aspirin 81 MG EC tablet Take 81 mg by mouth once daily budesonide-glycopyrrolate-formoterol (BREZTRI AEROSPHERE) 160-9-4.8 mcg/actuation inhaler Inhale 2 inhalations into the lungs 2 (two) times daily citalopram (CELEXA) 40 MG tablet Take 40 mg by mouth once daily meloxicam (MOBIC) 7.5 MG tablet Take 7.5 mg by mouth once daily (Patient not taking: Reported on 03/03/2023) ondansetron (ZOFRAN) 8 MG tablet Take 1 tablet (8 mg total) by mouth every 8 (eight) hours as needed for Nausea (Patient not taking: Reported on 03/03/2023) 30 tablet 0 temazepam (RESTORIL) 30 mg capsule Take 30 mg by mouth at bedtime as needed for Sleep valsartan (DIOVAN) 160 MG tablet Take 160 mg by mouth once daily  No current facility-administered medications for this visit.  Allergies: Allergies Allergen Reactions Codeine Nausea Levaquin [Levofloxacin] Unknown Lisinopril Cough Oxycodone-Acetaminophen Nausea Penicillins Hives Tramadol Nausea   Body mass index is 34.36 kg/m.  Review of Systems: A comprehensive 14 point ROS was performed, reviewed, and the pertinent orthopaedic findings are documented in the HPI.  Vitals: 03/03/23 0843 BP: 128/84   General Physical Examination:  General/Constitutional: No apparent distress: well-nourished and well developed. Eyes: Pupils equal, round with synchronous movement. Lungs: Clear to auscultation HEENT: Normal Vascular: No edema, swelling or tenderness, except as noted in detailed exam. Cardiac: Heart rate and rhythm is regular. Integumentary: No impressive skin lesions present, except as noted in detailed exam. Neuro/Psych: Normal mood and affect, oriented to person, place, and time. Musculoskeletal: Left wrist hairline fracture not affecting her tendons. Burning pain on her right wrist due  to prominent hardware.  Radiographs:  Last x-ray on 02/01/2023  showed a anatomically healed fracture with prominence of her plate and hardware is prominent. She also has some scapholunate widening and a nonunited ulnar styloid.  Assessment: ICD-10-CM 1. Painful orthopaedic hardware (CMS-HCC) T84.84XA 2. S/P ORIF (open reduction internal fixation) fracture Z98.890 Z87.81 3. Chronic bilateral low back pain without sciatica M54.50 G89.29  Plan:  The patient has clinical findings of status post of open reduction internal fixation of the right wrist with prominent painful hardware.  The proposed plan involves the removal of the hardware. She was advised that the area of fracture will likely be painful for 6 months to 1 year from her injury.  Document Attestation: I, Dawn Royse, have reviewed and updated documentation for [facility], MD, utilizing [employer] D[employer].   Electronically signed by Marlena Clipper, MD at 03/03/2023 3:53 PM EDT   Reviewed  H+P. No changes noted.

## 2023-03-30 NOTE — Discharge Instructions (Signed)
Keep arm elevated is much as possible Work on finger range of motion Pain medicine as directed Loosen Ace wrap prior to discharge and if fingers swell but leave underlying cotton roll alone Call office if you are having problems [phone]

## 2023-03-31 ENCOUNTER — Encounter: Payer: Self-pay | Admitting: Orthopedic Surgery

## 2023-03-31 ENCOUNTER — Ambulatory Visit
Admission: RE | Admit: 2023-03-31 | Discharge: 2023-03-31 | Disposition: A | Discharge status: Home / Self Care | Payer: Medicare Other | Source: Ambulatory Visit | Attending: Unknown Physician Specialty | Admitting: Unknown Physician Specialty

## 2023-03-31 ENCOUNTER — Ambulatory Visit
Admission: RE | Admit: 2023-03-31 | Discharge: 2023-03-31 | Disposition: A | Discharge status: Home / Self Care | Payer: Medicare Other | Source: Ambulatory Visit | Attending: Physical Medicine & Rehabilitation | Admitting: Physical Medicine & Rehabilitation

## 2023-03-31 DIAGNOSIS — K1120 Sialoadenitis, unspecified: Secondary | ICD-10-CM

## 2023-03-31 DIAGNOSIS — M5441 Lumbago with sciatica, right side: Secondary | ICD-10-CM

## 2023-03-31 MED ORDER — IOPAMIDOL (ISOVUE-300) INJECTION 61%
75.0000 mL | Freq: Once | INTRAVENOUS | Status: AC | PRN
  Administered 2023-03-31: 75 mL via INTRAVENOUS

## 2023-04-13 ENCOUNTER — Other Ambulatory Visit: Payer: Medicare Other

## 2023-04-21 ENCOUNTER — Encounter: Payer: Self-pay | Admitting: Nurse Practitioner

## 2023-04-22 NOTE — Telephone Encounter (Signed)
Attempted to reach patient, LVM to call office back to get scheduled.  Put in CRM.

## 2023-04-27 ENCOUNTER — Ambulatory Visit: Payer: Medicare Other | Admitting: Physician Assistant

## 2023-06-02 ENCOUNTER — Encounter: Payer: Self-pay | Admitting: Nurse Practitioner

## 2023-06-07 NOTE — Telephone Encounter (Signed)
Called patient and she stated that she would rather discuss at her upcoming appointment with provider rather than making an appointment.

## 2023-06-09 ENCOUNTER — Other Ambulatory Visit: Payer: Self-pay | Admitting: Nurse Practitioner

## 2023-06-09 NOTE — Telephone Encounter (Signed)
Requested medication (s) are due for refill today - yes  Requested medication (s) are on the active medication list -yes  Future visit scheduled -yes  Last refill: 02/11/23 #30 3RF  Notes to clinic: non delegated Rx  Requested Prescriptions  Pending Prescriptions Disp Refills   cyclobenzaprine (FLEXERIL) 5 MG tablet [Pharmacy Med Name: CYCLOBENZAPRINE 5 MG TABLET] 30 tablet 0    Sig: Take 1 tablet (5 mg total) by mouth 3 (three) times daily as needed for muscle spasms.     Not Delegated - Analgesics:  Muscle Relaxants Failed - 06/09/2023  9:57 AM      Failed - This refill cannot be delegated      Passed - Valid encounter within last 6 months    Recent Outpatient Visits           3 months ago Essential hypertension   [place] [facility] Larae Grooms, NP   4 months ago Swelling of right parotid gland   [place] [facility] Mecum, Oswaldo Conroy, PA-C   7 months ago [facility] [employer] preventive visit   [place] [facility] [place], Clydie Braun, NP   7 months ago Chronic obstructive pulmonary disease, unspecified COPD type [facility])   [place] [facility] Larae Grooms, NP   10 months ago Annual physical exam   [place] [facility] Larae Grooms, NP       Future Appointments             In 6 days Larae Grooms, NP [place] [facility]               Requested Prescriptions  Pending Prescriptions Disp Refills   cyclobenzaprine (FLEXERIL) 5 MG tablet [Pharmacy Med Name: CYCLOBENZAPRINE 5 MG TABLET] 30 tablet 0    Sig: Take 1 tablet (5 mg total) by mouth 3 (three) times daily as needed for muscle spasms.     Not Delegated - Analgesics:  Muscle Relaxants Failed - 06/09/2023  9:57 AM      Failed - This refill cannot be delegated      Passed - Valid encounter within last 6 months    Recent Outpatient Visits           3 months ago Essential hypertension   [facility] Larae Grooms, NP   4 months ago Swelling of right parotid gland   [place] [facility] Mecum, Oswaldo Conroy, PA-C   7 months ago [facility] to [employer] preventive visit   [place] [facility] Larae Grooms, NP   7 months ago Chronic obstructive pulmonary disease, unspecified COPD type [facility])   [place] [facility] Larae Grooms, NP   10 months ago Annual physical exam   [place] [facility] Larae Grooms, NP       Future Appointments             In 6 days Larae Grooms, NP  [facility], [facility]

## 2023-06-14 NOTE — Progress Notes (Unsigned)
There were no vitals taken for this visit.   Subjective:    Patient ID: Cassandra Brandt, female    DOB: [birth year], 67 y.o.   MRN: [id number]  HPI: Cassandra Brandt is a 67 y.o. female  No chief complaint on file.  HYPERTENSION without Chronic Kidney Disease Hypertension status: uncontrolled  Satisfied with current treatment? no Duration of hypertension: years BP monitoring frequency:  daily BP range: 120-130/80 BP medication side effects:  no Medication compliance: excellent compliance Previous BP meds:None Aspirin: no Recurrent headaches: yes Visual changes: no Palpitations: no Dyspnea: yes Chest pain: no Lower extremity edema: no Dizzy/lightheaded: no  COPD Patient has not gotten Breztri via patient assistance.  She has decreased her use of albuterol.  Feels like it is working well.  COPD status: controlled Satisfied with current treatment?: yes Oxygen use: no Dyspnea frequency: on cold days  Cough frequency:  Rescue inhaler frequency:   Limitation of activity: some Productive cough:  Last Spirometry: needs in the future Pneumovax: Up to Date Influenza: Up to Date  ANEMIA Anemia status: controlled Etiology of anemia: Duration of anemia treatment:  Compliance with treatment: excellent compliance Iron supplementation side effects: no Severity of anemia: mild Fatigue: no Decreased exercise tolerance: no  Dyspnea on exertion: no Palpitations: no Bleeding: no Pica: no  Relevant past medical, surgical, family and social history reviewed and updated as indicated. Interim medical history since our last visit reviewed. Allergies and medications reviewed and updated.  Review of Systems  Eyes:  Negative for visual disturbance.  Respiratory:  Negative for cough, chest tightness and shortness of breath.   Cardiovascular:  Negative for chest pain, palpitations and leg swelling.  Neurological:  Negative for dizziness and headaches.  Psychiatric/Behavioral:  Positive for  sleep disturbance.     Per HPI unless specifically indicated above     Objective:    There were no vitals taken for this visit.  Wt Readings from Last 3 Encounters:  03/30/23 195 lb (88.5 kg)  02/11/23 198 lb 6.4 oz (90 kg)  01/21/23 200 lb 12.8 oz (91.1 kg)    Physical Exam Vitals and nursing note reviewed.  Constitutional:      General: She is not in acute distress.    Appearance: Normal appearance. She is not ill-appearing, toxic-appearing or diaphoretic.  HENT:     Head: Normocephalic.     Right Ear: External ear normal.     Left Ear: External ear normal.     Nose: Nose normal.     Mouth/Throat:     Mouth: Mucous membranes are moist.     Pharynx: Oropharynx is clear.  Eyes:     General:        Right eye: No discharge.        Left eye: No discharge.     Extraocular Movements: Extraocular movements intact.     Conjunctiva/sclera: Conjunctivae normal.     Pupils: Pupils are equal, round, and reactive to light.  Cardiovascular:     Rate and Rhythm: Normal rate and regular rhythm.     Heart sounds: No murmur heard. Pulmonary:     Effort: Pulmonary effort is normal. No respiratory distress.     Breath sounds: Normal breath sounds. No wheezing or rales.  Musculoskeletal:     Cervical back: Normal range of motion and neck supple.  Skin:    General: Skin is warm and dry.     Capillary Refill: Capillary refill takes less than 2 seconds.  Neurological:  General: No focal deficit present.     Mental Status: She is alert and oriented to person, place, and time. Mental status is at baseline.  Psychiatric:        Mood and Affect: Mood normal.        Behavior: Behavior normal.        Thought Content: Thought content normal.        Judgment: Judgment normal.    Results for orders placed or performed in visit on 02/11/23  Comp Met (CMET)  Result Value Ref Range   Glucose 83 70 - 99 mg/dL   BUN 14 8 - 27 mg/dL   Creatinine, Ser 1.61 0.57 - 1.00 mg/dL   eGFR 84 >09  UE/AVW/0.98   BUN/Creatinine Ratio 18 12 - 28   Sodium 141 134 - 144 mmol/L   Potassium 4.0 3.5 - 5.2 mmol/L   Chloride 101 96 - 106 mmol/L   CO2 23 20 - 29 mmol/L   Calcium 9.4 8.7 - 10.3 mg/dL   Total Protein 5.9 (L) 6.0 - 8.5 g/dL   Albumin 4.0 3.9 - 4.9 g/dL   Globulin, Total 1.9 1.5 - 4.5 g/dL   Albumin/Globulin Ratio 2.1    Bilirubin Total 0.3 0.0 - 1.2 mg/dL   Alkaline Phosphatase 92 44 - 121 IU/L   AST 14 0 - 40 IU/L   ALT 12 0 - 32 IU/L  CBC w/Diff  Result Value Ref Range   WBC 9.8 3.4 - 10.8 x10E3/uL   RBC 4.77 3.77 - 5.28 x10E6/uL   Hemoglobin 14.1 11.1 - 15.9 g/dL   Hematocrit 11.9 14.7 - 46.6 %   MCV 92 79 - 97 fL   MCH 29.6 26.6 - 33.0 pg   MCHC 32.3 31.5 - 35.7 g/dL   RDW 82.9 56.2 - 13.0 %   Platelets 271 150 - 450 x10E3/uL   Neutrophils 59 Not Estab. %   Lymphs 33 Not Estab. %   Monocytes 5 Not Estab. %   Eos 2 Not Estab. %   Basos 0 Not Estab. %   Neutrophils Absolute 5.8 1.4 - 7.0 x10E3/uL   Lymphocytes Absolute 3.3 (H) 0.7 - 3.1 x10E3/uL   Monocytes Absolute 0.5 0.1 - 0.9 x10E3/uL   EOS (ABSOLUTE) 0.2 0.0 - 0.4 x10E3/uL   Basophils Absolute 0.0 0.0 - 0.2 x10E3/uL   Immature Granulocytes 1 Not Estab. %   Immature Grans (Abs) 0.1 0.0 - 0.1 x10E3/uL  Vitamin D (25 hydroxy)  Result Value Ref Range   Vit D, 25-Hydroxy 28.1 (L) 30.0 - 100.0 ng/mL      Assessment & Plan:   Problem List Items Addressed This Visit       Cardiovascular and Mediastinum   Essential hypertension - Primary     Respiratory   Chronic obstructive pulmonary disease, unspecified (HCC)     Other   Anxiety   Iron deficiency anemia   Vitamin D deficiency     Follow up plan: No follow-ups on file.

## 2023-06-15 ENCOUNTER — Ambulatory Visit (INDEPENDENT_AMBULATORY_CARE_PROVIDER_SITE_OTHER): Payer: Medicare Other | Admitting: Nurse Practitioner

## 2023-06-15 ENCOUNTER — Encounter: Payer: Self-pay | Admitting: Nurse Practitioner

## 2023-06-15 VITALS — BP 138/77 | HR 76 | Temp 98.3°F | Wt 191.8 lb

## 2023-06-15 DIAGNOSIS — F419 Anxiety disorder, unspecified: Secondary | ICD-10-CM

## 2023-06-15 DIAGNOSIS — D509 Iron deficiency anemia, unspecified: Secondary | ICD-10-CM

## 2023-06-15 DIAGNOSIS — Z79899 Other long term (current) drug therapy: Secondary | ICD-10-CM

## 2023-06-15 DIAGNOSIS — E559 Vitamin D deficiency, unspecified: Secondary | ICD-10-CM | POA: Diagnosis not present

## 2023-06-15 DIAGNOSIS — J449 Chronic obstructive pulmonary disease, unspecified: Secondary | ICD-10-CM

## 2023-06-15 DIAGNOSIS — F5101 Primary insomnia: Secondary | ICD-10-CM

## 2023-06-15 DIAGNOSIS — Z1211 Encounter for screening for malignant neoplasm of colon: Secondary | ICD-10-CM

## 2023-06-15 DIAGNOSIS — I10 Essential (primary) hypertension: Secondary | ICD-10-CM | POA: Diagnosis not present

## 2023-06-15 MED ORDER — CLOTRIMAZOLE 1 % EX OINT: 1.0000 "application " | TOPICAL_OINTMENT | Freq: Every day | CUTANEOUS | 1 refills | Status: AC

## 2023-06-15 NOTE — Assessment & Plan Note (Signed)
Chronic. Well controlled.  CCM was successful at getting Breztri via PAP.  Continue with Breztri.  Using Albuterol PRN- rarely.  Albuterol given at visit today.  Follow up in 3 months.  Call sooner if concerns arise.

## 2023-06-15 NOTE — Assessment & Plan Note (Signed)
Labs ordered at visit today.  Will make recommendations based on lab results.   

## 2023-06-15 NOTE — Assessment & Plan Note (Signed)
Chronic.  Controlled.  Continue with current medication regimen.  Labs ordered today.  Return to clinic in 6 months for reevaluation.  Call sooner if concerns arise.  ? ?

## 2023-06-15 NOTE — Assessment & Plan Note (Signed)
Chronic. Controlled with Temazepam.  Patient aware of risks of taking medication and okay with continuing.  UDS and controlled substance agreement are up to date.  Refills sent today.  Side effects and benefits of medication discussed during visit.  Follow up in 3 months.

## 2023-06-15 NOTE — Assessment & Plan Note (Signed)
Chronic. Controlled.  Continue to check blood pressures at home.  Consistently running 130/70s.  Continue with Valsartan and Amlodipine.  Refills sent today.  Labs ordered.  Follow up in 3 months.  Call sooner if concerns arise.

## 2023-06-15 NOTE — Assessment & Plan Note (Signed)
Chronic.  Controlled.  Continue with current medication regimen of Celexa 40mg  daily.  Labs ordered today.  Return to clinic in 3 months for reevaluation.  UDS and controlled substance agreement up to date.  Call sooner if concerns arise.

## 2023-06-16 LAB — COMPREHENSIVE METABOLIC PANEL
ALT: 9 [IU]/L (ref 0–32)
AST: 13 [IU]/L (ref 0–40)
Albumin: 4.4 g/dL (ref 3.9–4.9)
Alkaline Phosphatase: 112 [IU]/L (ref 44–121)
BUN/Creatinine Ratio: 23 (ref 12–28)
BUN: 15 mg/dL (ref 8–27)
Bilirubin Total: 0.3 mg/dL (ref 0.0–1.2)
CO2: 25 mmol/L (ref 20–29)
Calcium: 9.5 mg/dL (ref 8.7–10.3)
Chloride: 104 mmol/L (ref 96–106)
Creatinine, Ser: 0.66 mg/dL (ref 0.57–1.00)
Globulin, Total: 2.4 g/dL (ref 1.5–4.5)
Glucose: 79 mg/dL (ref 70–99)
Potassium: 4.1 mmol/L (ref 3.5–5.2)
Sodium: 144 mmol/L (ref 134–144)
Total Protein: 6.8 g/dL (ref 6.0–8.5)
eGFR: 95 mL/min/{1.73_m2} (ref >59)

## 2023-06-16 LAB — CBC WITH DIFFERENTIAL/PLATELET
Basophils Absolute: 0 10*3/uL (ref 0.0–0.2)
Basos: 0 %
EOS (ABSOLUTE): 0.2 10*3/uL (ref 0.0–0.4)
Eos: 2 %
Hematocrit: 45.6 % (ref 34.0–46.6)
Hemoglobin: 14.1 g/dL (ref 11.1–15.9)
Immature Grans (Abs): 0 10*3/uL (ref 0.0–0.1)
Immature Granulocytes: 0 %
Lymphocytes Absolute: 2.9 10*3/uL (ref 0.7–3.1)
Lymphs: 30 %
MCH: 29.3 pg (ref 26.6–33.0)
MCHC: 30.9 g/dL — ABNORMAL LOW (ref 31.5–35.7)
MCV: 95 fL (ref 79–97)
Monocytes Absolute: 0.6 10*3/uL (ref 0.1–0.9)
Monocytes: 6 %
Neutrophils Absolute: 5.8 10*3/uL (ref 1.4–7.0)
Neutrophils: 62 %
Platelets: 266 10*3/uL (ref 150–450)
RBC: 4.82 x10E6/uL (ref 3.77–5.28)
RDW: 14.2 % (ref 11.7–15.4)
WBC: 9.4 10*3/uL (ref 3.4–10.8)

## 2023-06-16 LAB — VITAMIN D 25 HYDROXY (VIT D DEFICIENCY, FRACTURES): Vit D, 25-Hydroxy: 29.4 ng/mL — ABNORMAL LOW (ref 30.0–100.0)

## 2023-06-16 NOTE — Progress Notes (Signed)
HI [name]. It was nice to see you yesterday.  Your lab work looks good.  I recommend vitamin D 1000 international units  once daily.  This is over the counter.  No concerns at this time. Continue with your current medication regimen.  Follow up as discussed.  Please let me know if you have any questions.

## 2023-06-17 NOTE — Progress Notes (Signed)
BP 131/81 Comment: Patient obtained   Subjective:    Patient ID: Cassandra Brandt, female    DOB: 09/19/1955, 67 y.o.   MRN: [id number]  HPI: Cassandra Brandt is a 67 y.o. female  Chief Complaint  Patient presents with   Gastroesophageal Reflux    Pt states she has been on medication before it didn't work she stopped taking it and now it has gradually getting worse. Pt states she has to sleep sitting up    Patient states she has been having bad acid reflux. She is having to sleep in a chair because if she lays down then she feels the reflux in her throat.  When it is exacerbated she ends up with rib pain and back pain.   She has tried omeprazole, zantac, rolaids, and tums without relief.  Pantoprazole helped make her more comfortable but didn't take the pain away.  States she also has a hiatal hernia.     Relevant past medical, surgical, family and social history reviewed and updated as indicated. Interim medical history since our last visit reviewed. Allergies and medications reviewed and updated.  Review of Systems  Gastrointestinal:        GERD    Per HPI unless specifically indicated above     Objective:    BP 131/81 Comment: Patient obtained  Wt Readings from Last 3 Encounters:  06/15/23 191 lb 12.8 oz (87 kg)  03/30/23 195 lb (88.5 kg)  02/11/23 198 lb 6.4 oz (90 kg)    Physical Exam Vitals and nursing note reviewed.  Constitutional:      General: She is not in acute distress.    Appearance: She is not ill-appearing.  HENT:     Head: Normocephalic.     Right Ear: Hearing normal.     Left Ear: Hearing normal.     Nose: Nose normal.  Pulmonary:     Effort: Pulmonary effort is normal. No respiratory distress.  Neurological:     Mental Status: She is alert.  Psychiatric:        Mood and Affect: Mood normal.        Behavior: Behavior normal.        Thought Content: Thought content normal.        Judgment: Judgment normal.     Results for orders placed or  performed in visit on 06/15/23  Comp Met (CMET)  Result Value Ref Range   Glucose 79 70 - 99 mg/dL   BUN 15 8 - 27 mg/dL   Creatinine, Ser 2.95 0.57 - 1.00 mg/dL   eGFR 95 >28 UX/LKG/4.01   BUN/Creatinine Ratio 23 12 - 28   Sodium 144 134 - 144 mmol/L   Potassium 4.1 3.5 - 5.2 mmol/L   Chloride 104 96 - 106 mmol/L   CO2 25 20 - 29 mmol/L   Calcium 9.5 8.7 - 10.3 mg/dL   Total Protein 6.8 6.0 - 8.5 g/dL   Albumin 4.4 3.9 - 4.9 g/dL   Globulin, Total 2.4 1.5 - 4.5 g/dL   Bilirubin Total 0.3 0.0 - 1.2 mg/dL   Alkaline Phosphatase 112 44 - 121 IU/L   AST 13 0 - 40 IU/L   ALT 9 0 - 32 IU/L  CBC w/Diff  Result Value Ref Range   WBC 9.4 3.4 - 10.8 x10E3/uL   RBC 4.82 3.77 - 5.28 x10E6/uL   Hemoglobin 14.1 11.1 - 15.9 g/dL   Hematocrit 02.7 25.3 - 46.6 %   MCV 95  79 - 97 fL   MCH 29.3 26.6 - 33.0 pg   MCHC 30.9 (L) 31.5 - 35.7 g/dL   RDW 60.1 09.3 - 23.5 %   Platelets 266 150 - 450 x10E3/uL   Neutrophils 62 Not Estab. %   Lymphs 30 Not Estab. %   Monocytes 6 Not Estab. %   Eos 2 Not Estab. %   Basos 0 Not Estab. %   Neutrophils Absolute 5.8 1.4 - 7.0 x10E3/uL   Lymphocytes Absolute 2.9 0.7 - 3.1 x10E3/uL   Monocytes Absolute 0.6 0.1 - 0.9 x10E3/uL   EOS (ABSOLUTE) 0.2 0.0 - 0.4 x10E3/uL   Basophils Absolute 0.0 0.0 - 0.2 x10E3/uL   Immature Granulocytes 0 Not Estab. %   Immature Grans (Abs) 0.0 0.0 - 0.1 x10E3/uL  Vitamin D (25 hydroxy)  Result Value Ref Range   Vit D, 25-Hydroxy 29.4 (L) 30.0 - 100.0 ng/mL      Assessment & Plan:   Problem List Items Addressed This Visit       Digestive   Gastroesophageal reflux disease - Primary    Chronic. Not well controlled.  Has tried Omeprazole, Rolaids, Zantac, and Tums.  Pantoprazole did help make her more comfortable but did not take away the symptoms.  States she has a hiatal hernia also.  Referral placed for GI.  Will restart Pantoprazole 40mg  BID. Follow up in 6 months.  Call sooner if concerns arise.       Relevant  Medications   pantoprazole (PROTONIX) 40 MG tablet   Other Relevant Orders   Ambulatory referral to Gastroenterology     Follow up plan: Return if symptoms worsen or fail to improve.   This visit was completed via MyChart due to the restrictions of the COVID-19 pandemic. All issues as above were discussed and addressed. Physical exam was done as above through visual confirmation on MyChart. If it was felt that the patient should be evaluated in the office, they were directed there. The patient verbally consented to this visit. Location of the patient: Home Location of the provider: Office Those involved with this call:  Provider: Larae Grooms, NP CMA: Maggie Font, CMA Front Desk/Registration: Servando Snare This encounter was conducted via video.  I spent 30 dedicated to the care of this patient on the date of this encounter to include previsit review of symptoms, plan of care and follow, face to face time with the patient, and post visit ordering of testing.

## 2023-06-18 ENCOUNTER — Encounter: Payer: Self-pay | Admitting: Nurse Practitioner

## 2023-06-18 ENCOUNTER — Telehealth (INDEPENDENT_AMBULATORY_CARE_PROVIDER_SITE_OTHER): Payer: Medicare Other | Admitting: Nurse Practitioner

## 2023-06-18 VITALS — BP 131/81

## 2023-06-18 DIAGNOSIS — K219 Gastro-esophageal reflux disease without esophagitis: Secondary | ICD-10-CM | POA: Diagnosis not present

## 2023-06-18 MED ORDER — ALENDRONATE SODIUM 70 MG PO TABS: 70.0000 mg | ORAL_TABLET | ORAL | 3 refills | Status: DC

## 2023-06-18 MED ORDER — PANTOPRAZOLE SODIUM 40 MG PO TBEC: 40.0000 mg | DELAYED_RELEASE_TABLET | Freq: Two times a day (BID) | ORAL | 1 refills | Status: DC

## 2023-06-18 MED ORDER — AMLODIPINE BESYLATE 5 MG PO TABS: 5.0000 mg | ORAL_TABLET | Freq: Every day | ORAL | 1 refills | Status: DC

## 2023-06-18 MED ORDER — CITALOPRAM HYDROBROMIDE 40 MG PO TABS: 40.0000 mg | ORAL_TABLET | Freq: Every day | ORAL | 1 refills | Status: DC

## 2023-06-18 MED ORDER — VALSARTAN 160 MG PO TABS: 160.0000 mg | ORAL_TABLET | Freq: Every day | ORAL | 1 refills | Status: DC

## 2023-06-18 MED ORDER — TEMAZEPAM 30 MG PO CAPS: 30.0000 mg | ORAL_CAPSULE | Freq: Every day | ORAL | 2 refills | Status: DC

## 2023-06-18 MED ORDER — ROSUVASTATIN CALCIUM 5 MG PO TABS: 5.0000 mg | ORAL_TABLET | Freq: Every day | ORAL | 1 refills | Status: DC

## 2023-06-18 NOTE — Assessment & Plan Note (Signed)
Chronic. Not well controlled.  Has tried Omeprazole, Rolaids, Zantac, and Tums.  Pantoprazole did help make her more comfortable but did not take away the symptoms.  States she has a hiatal hernia also.  Referral placed for GI.  Will restart Pantoprazole 40mg  BID. Follow up in 6 months.  Call sooner if concerns arise.

## 2023-06-23 ENCOUNTER — Encounter: Payer: Self-pay | Admitting: Nurse Practitioner

## 2023-06-24 ENCOUNTER — Telehealth: Payer: Self-pay

## 2023-06-24 NOTE — Telephone Encounter (Signed)
Received refill request from [facility] for Breztri, this refill will allow [place] to re enroll pt for 2025, please be on the lookout for fax with prescribing instructions

## 2023-07-04 LAB — COLOGUARD: COLOGUARD: NEGATIVE

## 2023-08-10 ENCOUNTER — Other Ambulatory Visit: Payer: Self-pay

## 2023-08-10 ENCOUNTER — Encounter: Payer: Self-pay | Admitting: Gastroenterology

## 2023-08-10 ENCOUNTER — Ambulatory Visit (INDEPENDENT_AMBULATORY_CARE_PROVIDER_SITE_OTHER): Payer: Medicare Other | Admitting: Gastroenterology

## 2023-08-10 VITALS — BP 146/80 | HR 77 | Temp 97.9°F | Ht 64.0 in | Wt 190.2 lb

## 2023-08-10 DIAGNOSIS — K219 Gastro-esophageal reflux disease without esophagitis: Secondary | ICD-10-CM | POA: Diagnosis not present

## 2023-08-10 DIAGNOSIS — R1319 Other dysphagia: Secondary | ICD-10-CM

## 2023-08-10 DIAGNOSIS — R634 Abnormal weight loss: Secondary | ICD-10-CM

## 2023-08-10 DIAGNOSIS — F17210 Nicotine dependence, cigarettes, uncomplicated: Secondary | ICD-10-CM | POA: Diagnosis not present

## 2023-08-10 DIAGNOSIS — Z800 Family history of malignant neoplasm of digestive organs: Secondary | ICD-10-CM

## 2023-08-10 DIAGNOSIS — R1310 Dysphagia, unspecified: Secondary | ICD-10-CM

## 2023-08-10 NOTE — Addendum Note (Signed)
Addended by: Adela Ports on: 08/10/2023 02:38 PM   Modules accepted: Orders

## 2023-08-10 NOTE — Progress Notes (Signed)
Wyline Mood MD, MRCP(U.K) [address]  [address]  [place], [place] [address]  Main: [phone]  Fax: [phone]   Gastroenterology Consultation  Referring Provider:     Larae Grooms, NP Primary Care Physician:  Larae Grooms, NP Primary Gastroenterologist:  Dr. Wyline Mood  Reason for Consultation:     GERD        HPI:   Cassandra Brandt is a 67 y.o. y/o female referred for consultation & management  by Larae Grooms, NP.      H/o Roux en Y gastric bypass.  She says that she has had difficulty swallowing for 1 year getting more often.  She has difficulty swallowing solids and liquids.  She has a history where food gets stuck in upper part of her throat and feels like she needs to gag.  Lost about 14 pounds of weight recently.  Her brother had esophageal cancer.  She has been a smoker.  History of reflux.  She says she is known to have a had a hiatal hernia in the past.  No recent upper endoscopy.  She takes pantoprazole 40 mg twice a day but it has not helped her with her symptoms.  Past Medical History:  Diagnosis Date   3-vessel CAD    COPD (chronic obstructive pulmonary disease) (HCC)    GERD (gastroesophageal reflux disease)    History of Roux-en-Y gastric bypass    Hypertension    Restless leg syndrome    Vertigo    Wears dentures    full upper and lower    Past Surgical History:  Procedure Laterality Date   CESAREAN SECTION     GASTRIC BYPASS     HARDWARE REMOVAL Left 03/30/2023   Procedure: Left distal radius DVR plate, hardware, removal;  Surgeon: Kennedy Bucker, MD;  Location: [facility];  Service: Orthopedics;  Laterality: Left;   OPEN REDUCTION INTERNAL FIXATION (ORIF) DISTAL RADIAL FRACTURE Left 12/29/2022   Procedure: OPEN REDUCTION INTERNAL FIXATION (ORIF) DISTAL RADIUS FRACTURE;  Surgeon: Kennedy Bucker, MD;  Location: [facility];  Service: Orthopedics;  Laterality: Left;   TONSILLECTOMY      Prior to Admission  medications   Medication Sig Start Date End Date Taking? Authorizing Provider  albuterol (VENTOLIN HFA) 108 (90 Base) MCG/ACT inhaler Inhale 2 puffs into the lungs every 6 (six) hours as needed for wheezing or shortness of breath. 11/11/22   Larae Grooms, NP  alendronate (FOSAMAX) 70 MG tablet Take 1 tablet (70 mg total) by mouth every 7 (seven) days. Take with a full glass of water on an empty stomach. 06/18/23   Larae Grooms, NP  amLODipine (NORVASC) 5 MG tablet Take 1 tablet (5 mg total) by mouth daily. 06/18/23   Larae Grooms, NP  aspirin EC 81 MG tablet Take 81 mg by mouth daily. Swallow whole.    [provider]  Budeson-Glycopyrrol-Formoterol (BREZTRI AEROSPHERE) 160-9-4.8 MCG/ACT AERO Inhale into the lungs 2 (two) times daily as needed.    [provider]  citalopram (CELEXA) 40 MG tablet Take 1 tablet (40 mg total) by mouth daily. 06/18/23   Larae Grooms, NP  Clotrimazole 1 % OINT Apply 1 application  topically daily. 06/15/23   Larae Grooms, NP  gabapentin (NEURONTIN) 300 MG capsule Take 300 mg by mouth at bedtime.    [provider]  pantoprazole (PROTONIX) 40 MG tablet Take 1 tablet (40 mg total) by mouth 2 (two) times daily. 06/18/23   Larae Grooms, NP  rosuvastatin (  CRESTOR) 5 MG tablet Take 1 tablet (5 mg total) by mouth daily. 06/18/23   Larae Grooms, NP  temazepam (RESTORIL) 30 MG capsule Take 1 capsule (30 mg total) by mouth at bedtime. 06/18/23   Larae Grooms, NP  valsartan (DIOVAN) 160 MG tablet Take 1 tablet (160 mg total) by mouth daily. 06/18/23   Larae Grooms, NP    No family history on file.   Social History   Tobacco Use   Smoking status: Every Day    Current packs/day: 0.50    Average packs/day: 0.5 packs/day for 50.0 years (25.0 ttl pk-yrs)    Types: Cigarettes   Smokeless tobacco: Never  Vaping Use   Vaping status: Never Used  Substance Use Topics   Alcohol use: No   Drug use: No     Allergies as of 08/10/2023 - Review Complete 08/10/2023  Allergen Reaction Noted   Penicillins Hives 05/01/2013   Codeine Hives, Itching, and Nausea Only 03/12/2012   Levaquin [levofloxacin in d5w] Other (See Comments) 03/12/2012   Lisinopril Cough 06/07/2020   Oxycodone-acetaminophen Nausea Only 06/07/2020   Tramadol Nausea Only 04/28/2021   Oxycodone Itching 05/08/2013    Review of Systems:    All systems reviewed and negative except where noted in HPI.   Physical Exam:  BP (!) 146/80   Pulse 77   Temp 97.9 F (36.6 C) (Oral)   Ht 5\' 4"  (1.626 m)   Wt 190 lb 3.2 oz (86.3 kg)   BMI 32.65 kg/m  No LMP recorded. Patient is postmenopausal. Psych:  Alert and cooperative. Normal mood and affect. General:   Alert,  Well-developed, well-nourished, pleasant and cooperative in NAD Head:  Normocephalic and atraumatic. Eyes:  Sclera clear, no icterus.   Conjunctiva pink. Ears:  Normal auditory acuity.  Neurologic:  Alert and oriented x3;  grossly normal neurologically. Psych:  Alert and cooperative. Normal mood and affect.  Imaging Studies: No results found.  Assessment and Plan:   Cassandra Brandt is a 67 y.o. y/o female has been referred for GERD but in fact her symptoms are mostly dysphagia, weight loss, history of smoking and family history of esophageal cancer.  Her symptoms of dysphagia have got worse for the past 1 year and her history is concerning for a neoplasm.  I will schedule her for an upper endoscopy ASAP.  She is aware she needs to stop smoking.  She is already on a PPI which she takes twice a day.  I will inform her primary care provider to consider stopping alendronate since it can cause severe pill esophagitis with obstruction if present.   I have discussed alternative options, risks & benefits,  which include, but are not limited to, bleeding, infection, perforation,respiratory complication & drug reaction.  The patient agrees with this plan & written consent will  be obtained.     Follow up in 6 to 8 to 12 weeks after procedure  Dr Wyline Mood MD,MRCP(U.K) Dysphagia

## 2023-08-13 ENCOUNTER — Encounter: Payer: Self-pay | Admitting: Gastroenterology

## 2023-08-16 ENCOUNTER — Encounter
Admission: RE | Disposition: A | Discharge status: Home / Self Care | Payer: Self-pay | Source: Home / Self Care | Attending: Gastroenterology

## 2023-08-16 ENCOUNTER — Encounter: Payer: Self-pay | Admitting: Gastroenterology

## 2023-08-16 ENCOUNTER — Other Ambulatory Visit: Payer: Self-pay

## 2023-08-16 ENCOUNTER — Ambulatory Visit: Payer: Medicare Other | Admitting: Certified Registered"

## 2023-08-16 ENCOUNTER — Ambulatory Visit
Admission: RE | Admit: 2023-08-16 | Discharge: 2023-08-16 | Disposition: A | Discharge status: Home / Self Care | Payer: Medicare Other | Attending: Gastroenterology | Admitting: Gastroenterology

## 2023-08-16 DIAGNOSIS — K319 Disease of stomach and duodenum, unspecified: Secondary | ICD-10-CM | POA: Insufficient documentation

## 2023-08-16 DIAGNOSIS — I10 Essential (primary) hypertension: Secondary | ICD-10-CM | POA: Diagnosis not present

## 2023-08-16 DIAGNOSIS — Z980 Intestinal bypass and anastomosis status: Secondary | ICD-10-CM | POA: Diagnosis not present

## 2023-08-16 DIAGNOSIS — Z79899 Other long term (current) drug therapy: Secondary | ICD-10-CM | POA: Diagnosis not present

## 2023-08-16 DIAGNOSIS — R1310 Dysphagia, unspecified: Secondary | ICD-10-CM | POA: Insufficient documentation

## 2023-08-16 DIAGNOSIS — Z5986 Financial insecurity: Secondary | ICD-10-CM | POA: Diagnosis not present

## 2023-08-16 DIAGNOSIS — F17210 Nicotine dependence, cigarettes, uncomplicated: Secondary | ICD-10-CM | POA: Diagnosis not present

## 2023-08-16 DIAGNOSIS — Z7951 Long term (current) use of inhaled steroids: Secondary | ICD-10-CM | POA: Insufficient documentation

## 2023-08-16 DIAGNOSIS — K2289 Other specified disease of esophagus: Secondary | ICD-10-CM

## 2023-08-16 DIAGNOSIS — I2510 Atherosclerotic heart disease of native coronary artery without angina pectoris: Secondary | ICD-10-CM | POA: Diagnosis not present

## 2023-08-16 DIAGNOSIS — Z9884 Bariatric surgery status: Secondary | ICD-10-CM | POA: Insufficient documentation

## 2023-08-16 DIAGNOSIS — J449 Chronic obstructive pulmonary disease, unspecified: Secondary | ICD-10-CM | POA: Insufficient documentation

## 2023-08-16 DIAGNOSIS — R1319 Other dysphagia: Secondary | ICD-10-CM

## 2023-08-16 HISTORY — PX: ESOPHAGOGASTRODUODENOSCOPY (EGD) WITH PROPOFOL: SHX5813

## 2023-08-16 HISTORY — PX: BIOPSY: SHX5522

## 2023-08-16 SURGERY — ESOPHAGOGASTRODUODENOSCOPY (EGD) WITH PROPOFOL
Anesthesia: General

## 2023-08-16 MED ORDER — PROPOFOL 10 MG/ML IV BOLUS
INTRAVENOUS | Status: DC | PRN
  Administered 2023-08-16 (×2): 10 mg via INTRAVENOUS
  Administered 2023-08-16: 100 mg via INTRAVENOUS
  Administered 2023-08-16: 10 mg via INTRAVENOUS

## 2023-08-16 MED ORDER — LIDOCAINE HCL (CARDIAC) PF 100 MG/5ML IV SOSY
PREFILLED_SYRINGE | INTRAVENOUS | Status: DC | PRN
  Administered 2023-08-16: 100 mg via INTRAVENOUS

## 2023-08-16 MED ORDER — PROPOFOL 10 MG/ML IV BOLUS
INTRAVENOUS | Status: AC
  Filled 2023-08-16: qty 40

## 2023-08-16 MED ORDER — LIDOCAINE HCL (PF) 2 % IJ SOLN
INTRAMUSCULAR | Status: AC
  Filled 2023-08-16: qty 5

## 2023-08-16 MED ORDER — SODIUM CHLORIDE 0.9 % IV SOLN
INTRAVENOUS | Status: DC
  Administered 2023-08-16: 20 mL/h via INTRAVENOUS

## 2023-08-16 NOTE — Transfer of Care (Signed)
Immediate Anesthesia Transfer of Care Note  Patient: Cassandra Brandt  Procedure(s) Performed: ESOPHAGOGASTRODUODENOSCOPY (EGD) WITH PROPOFOL BIOPSY  Patient Location: Endoscopy Unit  Anesthesia Type:General  Level of Consciousness: awake, alert , and oriented  Airway & Oxygen Therapy: Patient Spontanous Breathing  Post-op Assessment: Report given to RN and Post -op Vital signs reviewed and stable  Post vital signs: Reviewed and stable  Last Vitals:  Vitals Value Taken Time  BP 124/74 08/16/23 1108  Temp 35.9   Pulse 71 08/16/23 1108  Resp 17 08/16/23 1108  SpO2 95 % 08/16/23 1108    Last Pain:  Vitals:   08/16/23 1044  PainSc: 0-No pain         Complications: No notable events documented.

## 2023-08-16 NOTE — Op Note (Signed)
[facility] Gastroenterology Patient Name: Cassandra Brandt Procedure Date: 08/16/2023 10:51 AM MRN: [id number] Account #: [id number] Date of Birth: 17-Jan-1956 Admit Type: Outpatient Age: 67 Room: [facility] ENDO ROOM 1 Gender: Female Note Status: Finalized Instrument Name: Upper Endoscope [id number] Procedure:             Upper GI endoscopy Indications:           Dysphagia Providers:             Wyline Mood MD, MD Referring MD:          Larae Grooms (Referring MD) Medicines:             Monitored Anesthesia Care Complications:         No immediate complications. Procedure:             Pre-Anesthesia Assessment:                        - Prior to the procedure, a History and Physical was                         performed, and patient medications, allergies and                         sensitivities were reviewed. The patient's tolerance                         of previous anesthesia was reviewed.                        - The risks and benefits of the procedure and the                         sedation options and risks were discussed with the                         patient. All questions were answered and informed                         consent was obtained.                        - ASA Grade Assessment: II - A patient with mild                         systemic disease.                        After obtaining informed consent, the endoscope was                         passed under direct vision. Throughout the procedure,                         the patient's blood pressure, pulse, and oxygen                         saturations were monitored continuously. The Endoscope                         was introduced through the  mouth, and advanced to the                         jejunum. The upper GI endoscopy was accomplished with                         ease. The patient tolerated the procedure well. Findings:      The examined esophagus was normal. Biopsies were taken with a cold        forceps for histology.      The examined jejunum was normal.      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 This       was characterized by erythema and an intact staple line. Biopsies were       taken with a cold forceps for histology.      The cardia and gastric fundus were normal on retroflexion. Impression:            - Normal esophagus. Biopsied.                        - Normal examined jejunum.                        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 Biopsied. Recommendation:        - Discharge patient to home (with escort).                        - Resume previous diet.                        - Continue present medications.                        - Await pathology results.                        - Return to GI office as previously scheduled.                        - If dysphyagia persists obtain esophageal manometry                         to rule out achalasia Procedure Code(s):     --- Professional ---                        [phone], Esophagogastroduodenoscopy, flexible,                         transoral; with biopsy, single or multiple Diagnosis Code(s):     --- Professional ---                        Z98.0, Intestinal bypass and anastomosis status                        R13.10, Dysphagia, unspecified CPT copyright 2022 [employer] Association. All rights reserved. The codes documented in this report are preliminary  and upon coder review may  be revised to meet current compliance requirements. Wyline Mood, MD Wyline Mood MD, MD 08/16/2023 11:06:23 AM This report has been signed electronically. Number of Addenda: 0 Note Initiated On: 08/16/2023 10:51 AM Estimated Blood Loss:  Estimated blood loss: none.      [facility]

## 2023-08-16 NOTE — H&P (Signed)
Wyline Mood, MD [address], [place], [place], [address] [address], [address], [place], [place], [address] Phone: [phone]  Fax: [phone]  Primary Care Physician:  Larae Grooms, NP   Pre-Procedure History & Physical: HPI:  Cassandra Brandt is a 67 y.o. female is here for an endoscopy    Past Medical History:  Diagnosis Date   3-vessel CAD    COPD (chronic obstructive pulmonary disease) (HCC)    GERD (gastroesophageal reflux disease)    History of Roux-en-Y gastric bypass    Hypertension    Restless leg syndrome    Vertigo    Wears dentures    full upper and lower    Past Surgical History:  Procedure Laterality Date   CESAREAN SECTION     GASTRIC BYPASS     HARDWARE REMOVAL Left 03/30/2023   Procedure: Left distal radius DVR plate, hardware, removal;  Surgeon: Kennedy Bucker, MD;  Location: [facility];  Service: Orthopedics;  Laterality: Left;   OPEN REDUCTION INTERNAL FIXATION (ORIF) DISTAL RADIAL FRACTURE Left 12/29/2022   Procedure: OPEN REDUCTION INTERNAL FIXATION (ORIF) DISTAL RADIUS FRACTURE;  Surgeon: Kennedy Bucker, MD;  Location: [facility];  Service: Orthopedics;  Laterality: Left;   TONSILLECTOMY      Prior to Admission medications   Medication Sig Start Date End Date Taking? Authorizing Provider  albuterol (VENTOLIN HFA) 108 (90 Base) MCG/ACT inhaler Inhale 2 puffs into the lungs every 6 (six) hours as needed for wheezing or shortness of breath. 11/11/22   Larae Grooms, NP  amLODipine (NORVASC) 5 MG tablet Take 1 tablet (5 mg total) by mouth daily. 06/18/23   Larae Grooms, NP  aspirin EC 81 MG tablet Take 81 mg by mouth daily. Swallow whole.    [provider]  Budeson-Glycopyrrol-Formoterol (BREZTRI AEROSPHERE) 160-9-4.8 MCG/ACT AERO Inhale into the lungs 2 (two) times daily as needed.    [provider]  citalopram (CELEXA) 40 MG tablet Take 1 tablet (40 mg total) by mouth daily. 06/18/23    Larae Grooms, NP  Clotrimazole 1 % OINT Apply 1 application  topically daily. 06/15/23   Larae Grooms, NP  gabapentin (NEURONTIN) 300 MG capsule Take 300 mg by mouth at bedtime.    [provider]  pantoprazole (PROTONIX) 40 MG tablet Take 1 tablet (40 mg total) by mouth 2 (two) times daily. 06/18/23   Larae Grooms, NP  rosuvastatin (CRESTOR) 5 MG tablet Take 1 tablet (5 mg total) by mouth daily. 06/18/23   Larae Grooms, NP  temazepam (RESTORIL) 30 MG capsule Take 1 capsule (30 mg total) by mouth at bedtime. 06/18/23   Larae Grooms, NP  valsartan (DIOVAN) 160 MG tablet Take 1 tablet (160 mg total) by mouth daily. 06/18/23   Larae Grooms, NP    Allergies as of 08/10/2023 - Review Complete 08/10/2023  Allergen Reaction Noted   Penicillins Hives 05/01/2013   Codeine Hives, Itching, and Nausea Only 03/12/2012   Levaquin [levofloxacin in d5w] Other (See Comments) 03/12/2012   Lisinopril Cough 06/07/2020   Oxycodone-acetaminophen Nausea Only 06/07/2020   Tramadol Nausea Only 04/28/2021   Oxycodone Itching 05/08/2013    History reviewed. No pertinent family history.  Social History   Socioeconomic History   Marital status: Single    Spouse name: Not on file   Number of children: Not on file   Years of education: Not on file   Highest education level: 12th grade  Occupational History   Not on file  Tobacco Use   Smoking status: Every Day    Current packs/day: 0.50    Average packs/day: 0.5 packs/day for 50.0 years (25.0 ttl pk-yrs)    Types: Cigarettes   Smokeless tobacco: Never  Vaping Use   Vaping status: Never Used  Substance and Sexual Activity   Alcohol use: No   Drug use: No   Sexual activity: Not Currently  Other Topics Concern   Not on file  Social History Narrative   Not on file   Social Drivers of Health   Financial Resource Strain: Medium Risk (06/11/2023)   Overall Financial Resource Strain (CARDIA)    Difficulty of  Paying Living Expenses: Somewhat hard  Food Insecurity: Food Insecurity Present (06/11/2023)   Hunger Vital Sign    Worried About Running Out of Food in the Last Year: Sometimes true    Ran Out of Food in the Last Year: Often true  Transportation Needs: No Transportation Needs (06/11/2023)   PRAPARE - Administrator, Civil Service (Medical): No    Lack of Transportation (Non-Medical): No  Physical Activity: Insufficiently Active (06/11/2023)   Exercise Vital Sign    Days of Exercise per Week: 2 days    Minutes of Exercise per Session: 30 min  Stress: Stress Concern Present (06/11/2023)   [employer] of Occupational Health - Occupational Stress Questionnaire    Feeling of Stress : Rather much  Social Connections: Socially Isolated (06/11/2023)   Social Connection and Isolation Panel [NHANES]    Frequency of Communication with Friends and Family: Three times a week    Frequency of Social Gatherings with Friends and Family: Never    Attends Religious Services: Never    Database administrator or Organizations: No    Attends Engineer, structural: Not on file    Marital Status: Never married  Intimate Partner Violence: Not on file    Review of Systems: See HPI, otherwise negative ROS  Physical Exam: There were no vitals taken for this visit. General:   Alert,  pleasant and cooperative in NAD Head:  Normocephalic and atraumatic. Neck:  Supple; no masses or thyromegaly. Lungs:  Clear throughout to auscultation, normal respiratory effort.    Heart:  +S1, +S2, Regular rate and rhythm, No edema. Abdomen:  Soft, nontender and nondistended. Normal bowel sounds, without guarding, and without rebound.   Neurologic:  Alert and  oriented x4;  grossly normal neurologically.  Impression/Plan: Cassandra Brandt is here for an endoscopy  to be performed for  evaluation of dysphagia    Risks, benefits, limitations, and alternatives regarding endoscopy have been reviewed with  the patient.  Questions have been answered.  All parties agreeable.   Wyline Mood, MD  08/16/2023, 10:05 AM

## 2023-08-16 NOTE — Anesthesia Postprocedure Evaluation (Signed)
Anesthesia Post Note  Patient: CATHAY LIPE  Procedure(s) Performed: ESOPHAGOGASTRODUODENOSCOPY (EGD) WITH PROPOFOL BIOPSY  Patient location during evaluation: Endoscopy Anesthesia Type: General Level of consciousness: awake and alert Pain management: pain level controlled Vital Signs Assessment: post-procedure vital signs reviewed and stable Respiratory status: spontaneous breathing, nonlabored ventilation, respiratory function stable and patient connected to nasal cannula oxygen Cardiovascular status: blood pressure returned to baseline and stable Postop Assessment: no apparent nausea or vomiting Anesthetic complications: no   No notable events documented.   Last Vitals:  Vitals:   08/16/23 1044 08/16/23 1108  BP: 131/71 124/74  Pulse: 70 71  Resp: 20 17  Temp: (!) 36.2 C   SpO2: 99% 95%    Last Pain:  Vitals:   08/16/23 1044  PainSc: 0-No pain                 Corinda Gubler

## 2023-08-16 NOTE — Anesthesia Preprocedure Evaluation (Signed)
Anesthesia Evaluation  Patient identified by MRN, date of birth, ID band Patient awake    Reviewed: Allergy & Precautions, H&P , NPO status , Patient's Chart, lab work & pertinent test results  Airway Mallampati: II  TM Distance: <3 FB Neck ROM: Full    Dental no notable dental hx. (+) Edentulous Upper   Pulmonary COPD,  COPD inhaler, Current SmokerPatient did not abstain from smoking. Office note 12-01-22 Calculation 63 pack year smoking history (# packs/per year x # years smoked) CT 12/02/22 "mild" emphysema   Pulmonary exam normal breath sounds clear to auscultation       Cardiovascular Exercise Tolerance: Good hypertension, Pt. on medications + CAD  Normal cardiovascular exam Rhythm:Regular Rate:Normal  12-02-22 ardiovascular: Heart size is normal. There is no significant  pericardial fluid, thickening or pericardial calcification. There is  aortic atherosclerosis, as well as atherosclerosis of the great  vessels of the mediastinum and the coronary arteries, including  calcified atherosclerotic plaque in the left anterior descending,  left circumflex and right coronary arteries. Calcifications of the  mitral annulus.     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 Aortic atherosclerosis.    Endo/Other  negative endocrine ROS    Renal/GU negative Renal ROS  negative genitourinary   Musculoskeletal negative musculoskeletal ROS (+)    Abdominal   Peds negative pediatric ROS (+)  Hematology  (+) Blood dyscrasia, anemia   Anesthesia Other Findings Hypertension  GERD (gastroesophageal reflux disease) COPD (chronic obstructive pulmonary disease) Vertigo Wears dentures  Hx Roux-En-Y gastric bypass Restless Leg Syndrome  Discussed  postop pain meds with Dr. Rosita Kea. He states patient has had norco (hydrocodone) before without any difficulty.   Reproductive/Obstetrics negative OB ROS                             Anesthesia Physical Anesthesia Plan  ASA: 3  Anesthesia Plan: General   Post-op Pain Management: Minimal or no pain anticipated   Induction: Intravenous  PONV Risk Score and Plan: 2 and Propofol infusion, TIVA and Ondansetron  Airway Management Planned: Natural Airway  Additional Equipment: None  Intra-op Plan:   Post-operative Plan: Extubation in OR  Informed Consent: I have reviewed the patients History and Physical, chart, labs and discussed the procedure including the risks, benefits and alternatives for the proposed anesthesia with the patient or authorized representative who has indicated his/her understanding and acceptance.     Dental Advisory Given  Plan Discussed with: Anesthesiologist, CRNA and Surgeon  Anesthesia Plan Comments: (Discussed risks of anesthesia with patient, including possibility of difficulty with spontaneous ventilation under anesthesia necessitating airway intervention, PONV, and rare risks such as cardiac or respiratory or neurological events, and allergic reactions. Discussed the role of CRNA in patient's perioperative care. Patient understands. Patient counseled on benefits of smoking cessation, and increased perioperative risks associated with continued smoking. )        Anesthesia Quick Evaluation

## 2023-08-17 ENCOUNTER — Encounter: Payer: Self-pay | Admitting: Gastroenterology

## 2023-08-17 LAB — SURGICAL PATHOLOGY

## 2023-08-27 ENCOUNTER — Encounter: Payer: Self-pay | Admitting: Gastroenterology

## 2023-09-02 DIAGNOSIS — M47816 Spondylosis without myelopathy or radiculopathy, lumbar region: Secondary | ICD-10-CM | POA: Diagnosis not present

## 2023-09-03 ENCOUNTER — Encounter: Payer: Self-pay | Admitting: Nurse Practitioner

## 2023-09-06 DIAGNOSIS — M47816 Spondylosis without myelopathy or radiculopathy, lumbar region: Secondary | ICD-10-CM | POA: Diagnosis not present

## 2023-09-06 MED ORDER — TEMAZEPAM 30 MG PO CAPS: 30.0000 mg | ORAL_CAPSULE | Freq: Every day | ORAL | 2 refills | Status: DC

## 2023-09-15 ENCOUNTER — Ambulatory Visit: Payer: Medicare Other | Admitting: Nurse Practitioner

## 2023-09-17 DIAGNOSIS — M5441 Lumbago with sciatica, right side: Secondary | ICD-10-CM | POA: Diagnosis not present

## 2023-09-17 DIAGNOSIS — M5442 Lumbago with sciatica, left side: Secondary | ICD-10-CM | POA: Diagnosis not present

## 2023-09-17 DIAGNOSIS — G8929 Other chronic pain: Secondary | ICD-10-CM | POA: Diagnosis not present

## 2023-09-17 DIAGNOSIS — M47816 Spondylosis without myelopathy or radiculopathy, lumbar region: Secondary | ICD-10-CM | POA: Diagnosis not present

## 2023-09-17 DIAGNOSIS — M5416 Radiculopathy, lumbar region: Secondary | ICD-10-CM | POA: Diagnosis not present

## 2023-09-23 ENCOUNTER — Ambulatory Visit (INDEPENDENT_AMBULATORY_CARE_PROVIDER_SITE_OTHER): Payer: Medicare Other | Admitting: Nurse Practitioner

## 2023-09-23 ENCOUNTER — Encounter: Payer: Self-pay | Admitting: Nurse Practitioner

## 2023-09-23 VITALS — BP 120/77 | HR 80 | Temp 97.7°F | Ht 64.0 in | Wt 192.6 lb

## 2023-09-23 DIAGNOSIS — F339 Major depressive disorder, recurrent, unspecified: Secondary | ICD-10-CM | POA: Diagnosis not present

## 2023-09-23 DIAGNOSIS — F5101 Primary insomnia: Secondary | ICD-10-CM

## 2023-09-23 MED ORDER — DULOXETINE HCL 20 MG PO CPEP: 20.0000 mg | ORAL_CAPSULE | Freq: Every day | ORAL | 0 refills | Status: DC

## 2023-09-23 MED ORDER — TEMAZEPAM 30 MG PO CAPS: 30.0000 mg | ORAL_CAPSULE | Freq: Every day | ORAL | 2 refills | Status: DC

## 2023-09-23 NOTE — Assessment & Plan Note (Signed)
Chronic. Controlled with Temazepam.  Patient aware of risks of taking medication and okay with continuing.  UDS and controlled substance agreement updated today. Refills sent today.  Side effects and benefits of medication discussed during visit.  Follow up in 3 months.

## 2023-09-23 NOTE — Assessment & Plan Note (Signed)
Exacerbated over the last couple of months.  Has been taking Celexa but doesn't feel like its been working.  Will change to Duloxetine 20mg . Side effects and benefits of medication discussed.  Follow up in 1 month.  Call sooner if concerns arise.

## 2023-09-23 NOTE — Progress Notes (Signed)
BP 120/77 (BP Location: Right Arm, Patient Position: Sitting, Cuff Size: Large)   Pulse 80   Temp 97.7 F (36.5 C) (Oral)   Ht 5\' 4"  (1.626 m)   Wt 192 lb 9.6 oz (87.4 kg)   SpO2 97%   BMI 33.06 kg/m    Subjective:    Patient ID: Cassandra Brandt, female    DOB: 06-29-1956, 68 y.o.   MRN: [id number]  HPI: Cassandra Brandt is a 68 y.o. female  Chief Complaint  Patient presents with   Hypertension   Hyperlipidemia   INSOMNIA Duration: years Satisfied with sleep quality: yes Difficulty falling asleep: no Difficulty staying asleep: no Waking a few hours after sleep onset: no Early morning awakenings: no Daytime hypersomnolence: yes Wakes feeling refreshed: yes Good sleep hygiene: yes Apnea: no Snoring: yes Depressed/anxious mood: yes Recent stress: yes Restless legs/nocturnal leg cramps: no Chronic pain/arthritis: no History of sleep study: no Treatments attempted:  temazepam    DEPRESSION Patient states she has been in a funk for the last four months. She has had a lot going on.  Doesn't feel like doing anything when she is at home.  Goes to work and comes home and plays games on her phone.  Denies SI.   Flowsheet Row Office Visit from 09/23/2023 in [facility]  PHQ-9 Total Score 15         09/23/2023    9:18 AM 06/18/2023    8:19 AM 06/15/2023   10:10 AM 02/11/2023    1:17 PM  GAD 7 : Generalized Anxiety Score  Nervous, Anxious, on Edge 3 0 2 1  Control/stop worrying 3 0 1 2  Worry too much - different things 3 0 2 2  Trouble relaxing 2 0 2 1  Restless 1 0 1 0  Easily annoyed or irritable 3 0 1 2  Afraid - awful might happen 0 0 0 0  Total GAD 7 Score 15 0 9 8  Anxiety Difficulty  Not difficult at all Not difficult at all Somewhat difficult     She has had two nerve blocks done with Dr. Mariah Milling.   Relevant past medical, surgical, family and social history reviewed and updated as indicated. Interim medical history since our last visit  reviewed. Allergies and medications reviewed and updated.  Review of Systems  Psychiatric/Behavioral:  Positive for dysphoric mood and sleep disturbance. Negative for self-injury. The patient is nervous/anxious.     Per HPI unless specifically indicated above     Objective:    BP 120/77 (BP Location: Right Arm, Patient Position: Sitting, Cuff Size: Large)   Pulse 80   Temp 97.7 F (36.5 C) (Oral)   Ht 5\' 4"  (1.626 m)   Wt 192 lb 9.6 oz (87.4 kg)   SpO2 97%   BMI 33.06 kg/m   Wt Readings from Last 3 Encounters:  09/23/23 192 lb 9.6 oz (87.4 kg)  08/16/23 189 lb (85.7 kg)  08/10/23 190 lb 3.2 oz (86.3 kg)    Physical Exam Vitals and nursing note reviewed.  Constitutional:      General: She is not in acute distress.    Appearance: Normal appearance. She is normal weight. She is not ill-appearing, toxic-appearing or diaphoretic.  HENT:     Head: Normocephalic.     Right Ear: External ear normal.     Left Ear: External ear normal.     Nose: Nose normal.     Mouth/Throat:  Mouth: Mucous membranes are moist.     Pharynx: Oropharynx is clear.  Eyes:     General:        Right eye: No discharge.        Left eye: No discharge.     Extraocular Movements: Extraocular movements intact.     Conjunctiva/sclera: Conjunctivae normal.     Pupils: Pupils are equal, round, and reactive to light.  Cardiovascular:     Rate and Rhythm: Normal rate and regular rhythm.     Heart sounds: No murmur heard. Pulmonary:     Effort: Pulmonary effort is normal. No respiratory distress.     Breath sounds: Normal breath sounds. No wheezing or rales.  Musculoskeletal:     Cervical back: Normal range of motion and neck supple.  Skin:    General: Skin is warm and dry.     Capillary Refill: Capillary refill takes less than 2 seconds.  Neurological:     General: No focal deficit present.     Mental Status: She is alert and oriented to person, place, and time. Mental status is at baseline.   Psychiatric:        Mood and Affect: Mood normal. Affect is tearful.        Behavior: Behavior normal.        Thought Content: Thought content normal.        Judgment: Judgment normal.     Results for orders placed or performed during the hospital encounter of 08/16/23  Surgical pathology   Collection Time: 08/16/23 12:00 AM  Result Value Ref Range   SURGICAL PATHOLOGY      SURGICAL PATHOLOGY [facility] [address] [place], [place] [address] Telephone [phone] or [phone] Fax [phone]  REPORT OF SURGICAL PATHOLOGY   Accession #: [id number] Patient Name: Cassandra Brandt, Cassandra Brandt Visit # : [id number]  MRN: [id number] Physician: Wyline Mood DOB/Age 08-21-1956 (Age: 6) Gender: F Collected Date: 08/16/2023 Received Date: 08/16/2023  FINAL DIAGNOSIS       1. Stomach, biopsy, cbx :       - GASTRIC ANTRAL AND OXYNTIC MUCOSA WITH NONSPECIFIC REACTIVE GASTROPATHY      - HELICOBACTER PYLORI-LIKE ORGANISMS ARE NOT IDENTIFIED ON ROUTINE H&E STAIN       2. Esophagus, biopsy, cbx :       - ESOPHAGEAL SQUAMOUS MUCOSA WITH VASCULAR CONGESTION, AND SQUAMOUS BALLOONING,      SUGGESTIVE OF REFLUX ESOPHAGITIS      - NEGATIVE FOR INCREASED INTRAEPITHELIAL EOSINOPHILS       ELECTRONIC SIGNATURE : Kashikar Md, Nilesh, Sports administrator, International aid/development worker  MICROSCOPIC DESCRIPTION  CASE COMMENTS STAINS USED IN  DIAGNOSIS: H&E H&E    CLINICAL HISTORY  SPECIMEN(S) OBTAINED 1. Stomach, biopsy, Cbx 2. Esophagus, biopsy, Cbx  SPECIMEN COMMENTS: 2. R/O EOE SPECIMEN CLINICAL INFORMATION: 1. Dysphagia, Gastric bypass    Gross Description 1. Received in formalin are tan, soft tissue fragments that are submitted in toto.Number:  three,  Size:  0.2 to 0.4 cm,  (1 B) 2. Received in formalin are tan, soft tissue fragments that are submitted in toto.Number:  four ,  Size:  0.2 cm smallest to 0.3 cm largest, (1 B)   (GRP:kh 08/16/23)        Report signed out from the following location(s) [place] [address], [place] [address] CLIA #: [id number]  [facility] [address] [place], [place] [address] CLIA #: [id number]  Assessment & Plan:   Problem List Items Addressed This Visit       Other   Primary insomnia - Primary   Chronic. Controlled with Temazepam.  Patient aware of risks of taking medication and okay with continuing.  UDS and controlled substance agreement updated today. Refills sent today.  Side effects and benefits of medication discussed during visit.  Follow up in 3 months.      Relevant Orders   [id number] 11+Oxyco+Alc+Crt-Bund   Depression, recurrent (HCC)   Exacerbated over the last couple of months.  Has been taking Celexa but doesn't feel like its been working.  Will change to Duloxetine 20mg . Side effects and benefits of medication discussed.  Follow up in 1 month.  Call sooner if concerns arise.       Relevant Medications   DULoxetine (CYMBALTA) 20 MG capsule     Follow up plan: Return in about 1 month (around 10/24/2023) for Depression/Anxiety FU.

## 2023-09-27 ENCOUNTER — Encounter: Payer: Self-pay | Admitting: Nurse Practitioner

## 2023-09-28 MED ORDER — DULOXETINE HCL 20 MG PO CPEP: 40.0000 mg | ORAL_CAPSULE | Freq: Every day | ORAL | 0 refills | Status: DC

## 2023-10-01 DIAGNOSIS — G8929 Other chronic pain: Secondary | ICD-10-CM | POA: Diagnosis not present

## 2023-10-01 DIAGNOSIS — M47816 Spondylosis without myelopathy or radiculopathy, lumbar region: Secondary | ICD-10-CM | POA: Diagnosis not present

## 2023-10-01 DIAGNOSIS — M5441 Lumbago with sciatica, right side: Secondary | ICD-10-CM | POA: Diagnosis not present

## 2023-10-01 DIAGNOSIS — M5416 Radiculopathy, lumbar region: Secondary | ICD-10-CM | POA: Diagnosis not present

## 2023-10-05 ENCOUNTER — Encounter: Payer: Self-pay | Admitting: Nurse Practitioner

## 2023-10-05 LAB — DRUG SCREEN 764883 11+OXYCO+ALC+CRT-BUND
Amphetamines, Urine: NEGATIVE ng/mL
Barbiturate: NEGATIVE ng/mL
Cocaine (Metabolite): NEGATIVE ng/mL
Creatinine: 134.4 mg/dL (ref 20.0–300.0)
Ethanol: NEGATIVE %
Meperidine: NEGATIVE ng/mL
Methadone Screen, Urine: NEGATIVE ng/mL
OPIATE SCREEN URINE: NEGATIVE ng/mL
Oxycodone/Oxymorphone, Urine: NEGATIVE ng/mL
Phencyclidine: NEGATIVE ng/mL
Propoxyphene: NEGATIVE ng/mL
Tramadol: NEGATIVE ng/mL
pH, Urine: 5.9 (ref 4.5–8.9)

## 2023-10-05 LAB — BENZODIAZEPINES CONFIRM, URINE
Alprazolam: NEGATIVE
Benzodiazepines: POSITIVE ng/mL — AB
Clonazepam: NEGATIVE
Flurazepam: NEGATIVE
Lorazepam: NEGATIVE
Midazolam: NEGATIVE
Nordiazepam: NEGATIVE
Oxazepam Conf: 3718 ng/mL
Oxazepam: POSITIVE — AB
Temazepam Conf.: 32010 ng/mL
Temazepam: POSITIVE — AB
Triazolam: NEGATIVE

## 2023-10-05 LAB — CANNABINOID CONFIRMATION, UR
CANNABINOIDS: POSITIVE — AB
Carboxy THC GC/MS Conf: 272 ng/mL

## 2023-10-14 ENCOUNTER — Ambulatory Visit: Payer: Medicare Other | Admitting: Gastroenterology

## 2023-10-26 ENCOUNTER — Encounter: Payer: Self-pay | Admitting: Nurse Practitioner

## 2023-10-26 ENCOUNTER — Other Ambulatory Visit: Payer: Self-pay

## 2023-10-26 ENCOUNTER — Ambulatory Visit (INDEPENDENT_AMBULATORY_CARE_PROVIDER_SITE_OTHER): Payer: Medicare Other | Admitting: Nurse Practitioner

## 2023-10-26 VITALS — BP 127/73 | HR 79 | Ht 64.0 in | Wt 193.0 lb

## 2023-10-26 DIAGNOSIS — F5101 Primary insomnia: Secondary | ICD-10-CM | POA: Diagnosis not present

## 2023-10-26 DIAGNOSIS — F17210 Nicotine dependence, cigarettes, uncomplicated: Secondary | ICD-10-CM

## 2023-10-26 DIAGNOSIS — Z87891 Personal history of nicotine dependence: Secondary | ICD-10-CM

## 2023-10-26 DIAGNOSIS — Z122 Encounter for screening for malignant neoplasm of respiratory organs: Secondary | ICD-10-CM

## 2023-10-26 DIAGNOSIS — F419 Anxiety disorder, unspecified: Secondary | ICD-10-CM | POA: Diagnosis not present

## 2023-10-26 DIAGNOSIS — Z1231 Encounter for screening mammogram for malignant neoplasm of breast: Secondary | ICD-10-CM | POA: Diagnosis not present

## 2023-10-26 MED ORDER — TEMAZEPAM 30 MG PO CAPS
30.0000 mg | ORAL_CAPSULE | Freq: Every day | ORAL | 2 refills | Status: DC
Start: 2023-10-26 — End: 2024-01-25

## 2023-10-26 MED ORDER — DULOXETINE HCL 60 MG PO CPEP: 60.0000 mg | ORAL_CAPSULE | Freq: Every day | ORAL | 1 refills | Status: DC

## 2023-10-26 NOTE — Assessment & Plan Note (Signed)
 Chronic. Improved with Duloxetine 40mg .  Will increase to Duloxetine 60mg .  Follow up in 3 months.  Call sooner if concerns arise.

## 2023-10-26 NOTE — Assessment & Plan Note (Signed)
 Chronic. Controlled with Temazepam.  Patient aware of risks of taking medication and okay with continuing.  UDS and controlled substance agreement updated today. Refills sent today.  Side effects and benefits of medication discussed during visit.  Follow up in 3 months.

## 2023-10-26 NOTE — Progress Notes (Signed)
 BP 127/73 (BP Location: Right Arm, Patient Position: Sitting, Cuff Size: Large)   Pulse 79   Ht 5\' 4"  (1.626 m)   Wt 193 lb (87.5 kg)   SpO2 98%   BMI 33.13 kg/m    Subjective:    Patient ID: Cassandra Brandt, female    DOB: [birth year], 69 y.o.   MRN: [id number]  HPI: Cassandra Brandt is a 68 y.o. female  Chief Complaint  Patient presents with   Follow-up    Anxiety/depression Pt requesting increase 60 mg Duloxetine   DEPRESSION Patient states she the Duloxetine is helping.  She would like to increase the dose to 60mg .  She is currently taking 40mg .  Has been having a lot doctors appointments which is frustrating but understands they need to be done.  Feels like the Duloxetine is helping some with her back pain.  Denies SI.   Flowsheet Row Office Visit from 10/26/2023 in [facility]  PHQ-9 Total Score 3         10/26/2023    9:14 AM 09/23/2023    9:18 AM 06/18/2023    8:19 AM 06/15/2023   10:10 AM  GAD 7 : Generalized Anxiety Score  Nervous, Anxious, on Edge 1 3 0 2  Control/stop worrying 1 3 0 1  Worry too much - different things 1 3 0 2  Trouble relaxing 1 2 0 2  Restless 1 1 0 1  Easily annoyed or irritable 1 3 0 1  Afraid - awful might happen 1 0 0 0  Total GAD 7 Score 7 15 0 9  Anxiety Difficulty Somewhat difficult  Not difficult at all Not difficult at all     Relevant past medical, surgical, family and social history reviewed and updated as indicated. Interim medical history since our last visit reviewed. Allergies and medications reviewed and updated.  Review of Systems  Psychiatric/Behavioral:  Positive for dysphoric mood and sleep disturbance. Negative for self-injury. The patient is nervous/anxious.     Per HPI unless specifically indicated above     Objective:    BP 127/73 (BP Location: Right Arm, Patient Position: Sitting, Cuff Size: Large)   Pulse 79   Ht 5\' 4"  (1.626 m)   Wt 193 lb (87.5 kg)   SpO2 98%   BMI 33.13 kg/m    Wt Readings from Last 3 Encounters:  10/26/23 193 lb (87.5 kg)  09/23/23 192 lb 9.6 oz (87.4 kg)  08/16/23 189 lb (85.7 kg)    Physical Exam Vitals and nursing note reviewed.  Constitutional:      General: She is not in acute distress.    Appearance: Normal appearance. She is normal weight. She is not ill-appearing, toxic-appearing or diaphoretic.  HENT:     Head: Normocephalic.     Right Ear: External ear normal.     Left Ear: External ear normal.     Nose: Nose normal.     Mouth/Throat:     Mouth: Mucous membranes are moist.     Pharynx: Oropharynx is clear.  Eyes:     General:        Right eye: No discharge.        Left eye: No discharge.     Extraocular Movements: Extraocular movements intact.     Conjunctiva/sclera: Conjunctivae normal.     Pupils: Pupils are equal, round, and reactive to light.  Cardiovascular:     Rate and Rhythm: Normal rate and regular rhythm.  Heart sounds: No murmur heard. Pulmonary:     Effort: Pulmonary effort is normal. No respiratory distress.     Breath sounds: Normal breath sounds. No wheezing or rales.  Musculoskeletal:     Cervical back: Normal range of motion and neck supple.  Skin:    General: Skin is warm and dry.     Capillary Refill: Capillary refill takes less than 2 seconds.  Neurological:     General: No focal deficit present.     Mental Status: She is alert and oriented to person, place, and time. Mental status is at baseline.  Psychiatric:        Mood and Affect: Mood normal. Affect is tearful.        Behavior: Behavior normal.        Thought Content: Thought content normal.        Judgment: Judgment normal.     Results for orders placed or performed in visit on 09/23/23  [id number] 11+Oxyco+Alc+Crt-Bund   Collection Time: 09/23/23  9:33 AM  Result Value Ref Range   Ethanol Negative Cutoff=0.020 %   Amphetamines, Urine Negative Cutoff=1000 ng/mL   Barbiturate Negative Cutoff=200 ng/mL   BENZODIAZ UR QL See Final  Results Cutoff=200 ng/mL   Cannabinoid Quant, Ur See Final Results Cutoff=50 ng/mL   Cocaine (Metabolite) Negative Cutoff=300 ng/mL   OPIATE SCREEN URINE Negative Cutoff=300 ng/mL   Oxycodone/Oxymorphone, Urine Negative Cutoff=300 ng/mL   Phencyclidine Negative Cutoff=25 ng/mL   Methadone Screen, Urine Negative Cutoff=300 ng/mL   Propoxyphene Negative Cutoff=300 ng/mL   Meperidine Negative Cutoff=200 ng/mL   Tramadol Negative Cutoff=200 ng/mL   Creatinine 134.4 20.0 - 300.0 mg/dL   pH, Urine 5.9 4.5 - 8.9  Benzodiazepines Confirm, Urine   Collection Time: 09/23/23  9:33 AM  Result Value Ref Range   Benzodiazepines Positive (A) Cutoff=200 ng/mL   Nordiazepam Negative Cutoff=100   Oxazepam Positive (A)    Oxazepam Conf 3,718 Cutoff=100 ng/mL   Flurazepam Negative Cutoff=100   Lorazepam Negative Cutoff=100   Alprazolam Negative Cutoff=100   Clonazepam Negative Cutoff=100   Temazepam Positive (A)    Temazepam Conf. 32,010 Cutoff=100 ng/mL   Triazolam Negative Cutoff=100   Midazolam Negative Cutoff=100  Cannabinoid Conf, Ur   Collection Time: 09/23/23  9:33 AM  Result Value Ref Range   CANNABINOIDS Positive (A) Cutoff=50   Carboxy THC GC/MS Conf 272 Cutoff=15 ng/mL      Assessment & Plan:   Problem List Items Addressed This Visit       Other   Anxiety   Chronic. Improved with Duloxetine 40mg .  Will increase to Duloxetine 60mg .  Follow up in 3 months.  Call sooner if concerns arise.       Relevant Medications   DULoxetine (CYMBALTA) 60 MG capsule   Primary insomnia   Chronic. Controlled with Temazepam.  Patient aware of risks of taking medication and okay with continuing.  UDS and controlled substance agreement updated today. Refills sent today.  Side effects and benefits of medication discussed during visit.  Follow up in 3 months.      Other Visit Diagnoses       Encounter for screening mammogram for malignant neoplasm of breast    -  Primary   Relevant Orders   MM  3D SCREENING MAMMOGRAM BILATERAL BREAST         Follow up plan: Return in about 3 months (around 01/23/2024) for HTN, HLD, DM2 FU.

## 2023-10-27 ENCOUNTER — Telehealth: Payer: Self-pay

## 2023-10-27 NOTE — Progress Notes (Unsigned)
   10/27/2023  Patient ID: Cassandra Brandt, female   DOB: 06-10-1956, 68 y.o.   MRN: [id number]  Clinic routed request from patient's PCP stating patient endorses it is time to renew AZ&Me PAP for [facility].  Contacted AZ&Me, and patient needs to call AZ&Me to provide consent for 2025 re-enrollment; and provider needs to send new prescription to [facility].  Prescription pending for PCP to sign if in agreement.  Called patient to provide information and phone number for AZ&Me; she was driving and asked that I send this information to her email address, which I have done.  Provided direct phone number to me in case she has any trouble providing consent for re-enrollment.  Lenna Gilford, PharmD, DPLA

## 2023-10-28 DIAGNOSIS — M5416 Radiculopathy, lumbar region: Secondary | ICD-10-CM | POA: Diagnosis not present

## 2023-10-28 MED ORDER — BREZTRI AEROSPHERE 160-9-4.8 MCG/ACT IN AERO
2.0000 | INHALATION_SPRAY | Freq: Two times a day (BID) | RESPIRATORY_TRACT | 4 refills | Status: DC
Start: 2023-10-28 — End: 2023-11-10

## 2023-11-08 ENCOUNTER — Ambulatory Visit
Admission: RE | Admit: 2023-11-08 | Discharge: 2023-11-08 | Disposition: A | Discharge status: Home / Self Care | Payer: Medicare Other | Source: Ambulatory Visit | Attending: Nurse Practitioner | Admitting: Nurse Practitioner

## 2023-11-08 DIAGNOSIS — Z1231 Encounter for screening mammogram for malignant neoplasm of breast: Secondary | ICD-10-CM | POA: Insufficient documentation

## 2023-11-09 ENCOUNTER — Telehealth: Payer: Self-pay

## 2023-11-09 NOTE — Progress Notes (Signed)
   11/09/2023  Patient ID: Cassandra Brandt, female   DOB: [birth year], 68 y.o.   MRN: [id number]  Received email from patient she has been attempting to provide consent for AZ& Me PAP 2025 re-enrollment for her [name] for the past 3 weeks but has not been successful.  I contacted the company, and they state a whole new application needs to be submitted at this time.  I have prefilled patient and provider information, and Ms. Dunford is coming by the office tomorrow while I am there to sign her portion.  I will also obtain PCP signature and fax into AZ&Me.  Patient is currently out of Breztri, so I will also see if the office has a sample she could pick up.  Lenna Gilford, PharmD, DPLA

## 2023-11-10 ENCOUNTER — Other Ambulatory Visit: Payer: Self-pay

## 2023-11-10 NOTE — Progress Notes (Unsigned)
   11/10/2023  Patient ID: Cassandra Brandt, female   DOB: Jan 13, 1956, 68 y.o.   MRN: [id number]  Patient presenting to [facility] to complete AZ&Me patient assistance application for [facility].  Patient portion has been completed, and provider portion prefilled and placed in PCP folder to review, sign/date, and fax into AZ&Me.  Sample of Cassandra Brandt was also provided to patient today.  Will follow-up on patient assistance application process the next week.  Lenna Gilford, PharmD, DPLA

## 2023-11-11 DIAGNOSIS — G8929 Other chronic pain: Secondary | ICD-10-CM | POA: Diagnosis not present

## 2023-11-11 DIAGNOSIS — M47816 Spondylosis without myelopathy or radiculopathy, lumbar region: Secondary | ICD-10-CM | POA: Diagnosis not present

## 2023-11-11 DIAGNOSIS — M5416 Radiculopathy, lumbar region: Secondary | ICD-10-CM | POA: Diagnosis not present

## 2023-11-11 DIAGNOSIS — M5441 Lumbago with sciatica, right side: Secondary | ICD-10-CM | POA: Diagnosis not present

## 2023-11-11 MED ORDER — BREZTRI AEROSPHERE 160-9-4.8 MCG/ACT IN AERO: 2.0000 | INHALATION_SPRAY | Freq: Two times a day (BID) | RESPIRATORY_TRACT | 4 refills | Status: DC

## 2023-11-15 ENCOUNTER — Encounter: Payer: Self-pay | Admitting: Emergency Medicine

## 2023-11-15 ENCOUNTER — Encounter: Payer: Self-pay | Admitting: Nurse Practitioner

## 2023-11-15 DIAGNOSIS — K439 Ventral hernia without obstruction or gangrene: Secondary | ICD-10-CM

## 2023-11-15 DIAGNOSIS — Z800 Family history of malignant neoplasm of digestive organs: Secondary | ICD-10-CM | POA: Insufficient documentation

## 2023-11-15 HISTORY — DX: Ventral hernia without obstruction or gangrene: K43.9

## 2023-11-16 ENCOUNTER — Ambulatory Visit (INDEPENDENT_AMBULATORY_CARE_PROVIDER_SITE_OTHER): Admitting: Emergency Medicine

## 2023-11-16 VITALS — Ht 64.0 in | Wt 190.0 lb

## 2023-11-16 DIAGNOSIS — Z0000 Encounter for general adult medical examination without abnormal findings: Secondary | ICD-10-CM

## 2023-11-16 NOTE — Progress Notes (Signed)
 Subjective:   Cassandra Brandt is a 68 y.o. who presents for a Medicare Wellness preventive visit.  Visit Complete: Virtual I connected with  [name] on 11/16/23 by a audio enabled telemedicine application and verified that I am speaking with the correct person using two identifiers.  Patient Location: Home  Provider Location: Office/Clinic  I discussed the limitations of evaluation and management by telemedicine. The patient expressed understanding and agreed to proceed.  Vital Signs: Because this visit was a virtual/telehealth visit, some criteria may be missing or patient reported. Any vitals not documented were not able to be obtained and vitals that have been documented are patient reported.  VideoDeclined- This patient declined Librarian, academic. Therefore the visit was completed with audio only.  Persons Participating in Visit: Patient.  AWV Questionnaire: No: Patient Medicare AWV questionnaire was not completed prior to this visit.  Cardiac Risk Factors include: advanced age (>65men, >39 women);obesity (BMI >30kg/m2);hypertension;smoking/ tobacco exposure     Objective:    Today's Vitals   11/16/23 0841  Weight: 190 lb (86.2 kg)  Height: 5\' 4"  (1.626 m)   Body mass index is 32.61 kg/m.     11/16/2023    8:54 AM 08/16/2023   10:42 AM 03/30/2023    7:48 AM 12/29/2022   11:42 AM 12/27/2022   10:07 PM 11/27/2019    4:58 PM 11/27/2019    4:43 PM  Advanced Directives  Does Patient Have a Medical Advance Directive? Yes Yes Yes No No No Yes  Type of Estate agent of [place];Living will Healthcare Power of [place];Living will Healthcare Power of [place];Living will      Does patient want to make changes to medical advance directive? No - Patient declined  No - Patient declined   No - Patient declined   Copy of Healthcare Power of Attorney in Chart? No - copy requested No - copy requested No - copy requested      Would  patient like information on creating a medical advance directive?    Yes (MAU/Ambulatory/Procedural Areas - Information given)  No - Patient declined     Current Medications (verified) Outpatient Encounter Medications as of 11/16/2023  Medication Sig   amLODipine (NORVASC) 5 MG tablet Take 1 tablet (5 mg total) by mouth daily.   aspirin EC 81 MG tablet Take 81 mg by mouth daily. Swallow whole.   BREZTRI AEROSPHERE 160-9-4.8 MCG/ACT AERO Inhale 2 puffs into the lungs 2 (two) times daily.   cetirizine (ZYRTEC) 10 MG tablet Take 10 mg by mouth daily.   Clotrimazole 1 % OINT Apply 1 application  topically daily.   DULoxetine (CYMBALTA) 60 MG capsule Take 1 capsule (60 mg total) by mouth daily.   gabapentin (NEURONTIN) 300 MG capsule Take 300 mg by mouth 2 (two) times daily. Take 2 tablets daily 600 mg total   pantoprazole (PROTONIX) 40 MG tablet Take 1 tablet (40 mg total) by mouth 2 (two) times daily.   rosuvastatin (CRESTOR) 5 MG tablet Take 1 tablet (5 mg total) by mouth daily.   temazepam (RESTORIL) 30 MG capsule Take 1 capsule (30 mg total) by mouth at bedtime.   valsartan (DIOVAN) 160 MG tablet Take 1 tablet (160 mg total) by mouth daily.   No facility-administered encounter medications on file as of 11/16/2023.    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   Restless leg syndrome    Sleep apnea    Vertigo    Wears dentures    full upper and lower   Past Surgical History:  Procedure Laterality Date   BIOPSY  08/16/2023   Procedure: BIOPSY;  Surgeon:  Wyline Mood, MD;  Location: [facility] ENDOSCOPY;  Service: Gastroenterology;;   CESAREAN SECTION     ESOPHAGOGASTRODUODENOSCOPY (EGD) WITH PROPOFOL N/A 08/16/2023   Procedure: ESOPHAGOGASTRODUODENOSCOPY (EGD) WITH PROPOFOL;  Surgeon: Wyline Mood, MD;  Location: [facility] ENDOSCOPY;  Service: Gastroenterology;  Laterality: N/A;   FRACTURE SURGERY     GASTRIC BYPASS     HARDWARE REMOVAL Left 03/30/2023   Procedure: Left distal radius DVR plate, hardware, removal;  Surgeon: Kennedy Bucker, MD;  Location: [facility];  Service: Orthopedics;  Laterality: Left;   OPEN REDUCTION INTERNAL FIXATION (ORIF) DISTAL RADIAL FRACTURE Left 12/29/2022   Procedure: OPEN REDUCTION INTERNAL FIXATION (ORIF) DISTAL RADIUS FRACTURE;  Surgeon: Kennedy Bucker, MD;  Location: [facility];  Service: Orthopedics;  Laterality: Left;   TONSILLECTOMY     TUBAL LIGATION     Family History  Problem Relation Age of Onset   COPD Mother    Diabetes Mother    Pneumonia Mother    Huntington's disease Father    Social History   Socioeconomic History   Marital status: Single    Spouse name: Not on file   Number of children: 1   Years of education: Not on file   Highest education level: 12th grade  Occupational History   Occupation: retired  Tobacco Use   Smoking status: Every Day    Current packs/day: 0.50    Average packs/day: 0.5 packs/day for 51.2 years (25.6 ttl pk-yrs)    Types: Cigarettes    Start date: 8   Smokeless tobacco: Never  Vaping Use   Vaping status: Never Used  Substance and Sexual Activity   Alcohol use: No   Drug use: No   Sexual activity: Not Currently    Birth control/protection: None  Other Topics Concern   Not on file  Social History Narrative   Not on file   Social Drivers of Health   Financial Resource Strain: Low Risk  (11/16/2023)   Overall Financial Resource Strain (CARDIA)    Difficulty of Paying Living Expenses: Not very hard  Recent Concern: Financial Resource  Strain - Medium Risk (09/19/2023)   Overall Financial Resource Strain (CARDIA)    Difficulty of Paying Living Expenses: Somewhat hard  Food Insecurity: Food Insecurity Present (11/16/2023)   Hunger Vital Sign    Worried About Running Out of Food in the Last Year: Sometimes true    Ran Out of Food in the Last Year: Sometimes true  Transportation Needs: No Transportation Needs (11/16/2023)   PRAPARE - Administrator, Civil Service (Medical): No    Lack of Transportation (Non-Medical): No  Physical Activity: Insufficiently Active (11/16/2023)   Exercise Vital Sign    Days of Exercise per Week: 3 days    Minutes of Exercise per Session: 30 min  Stress: No Stress Concern Present (11/16/2023)   [employer] of Occupational  Health - Occupational Stress Questionnaire    Feeling of Stress : Only a little  Recent Concern: Stress - Stress Concern Present (09/19/2023)   [employer] of Occupational Health - Occupational Stress Questionnaire    Feeling of Stress : Rather much  Social Connections: Socially Isolated (11/16/2023)   Social Connection and Isolation Panel [NHANES]    Frequency of Communication with Friends and Family: More than three times a week    Frequency of Social Gatherings with Friends and Family: Never    Attends Religious Services: Never    Database administrator or Organizations: No    Attends Engineer, structural: Never    Marital Status: Never married    Tobacco Counseling Ready to quit: No Counseling given: Not Answered    Clinical Intake:  Pre-visit preparation completed: Yes  Pain : No/denies pain     BMI - recorded: 32.61 Nutritional Status: BMI > 30  Obese Nutritional Risks: None Diabetes: No  How often do you need to have someone help you when you read instructions, pamphlets, or other written materials from your doctor or pharmacy?: 1 - Never  Interpreter Needed?: No  Information entered by :: Tora Kindred,  CMA   Activities of Daily Living     11/16/2023    8:42 AM 03/30/2023    7:51 AM  In your present state of health, do you have any difficulty performing the following activities:  Hearing? 1 0  Comment has been evaluated and needs hearing aids, but cannot afford   Vision? 0 0  Difficulty concentrating or making decisions? 0 0  Walking or climbing stairs? 0 0  Dressing or bathing? 0 0  Doing errands, shopping? 0   Preparing Food and eating ? N   Using the Toilet? N   In the past six months, have you accidently leaked urine? N   Do you have problems with loss of bowel control? N   Managing your Medications? N   Managing your Finances? N   Housekeeping or managing your Housekeeping? N     Patient Care Team: Larae Grooms, NP as PCP - General (Nurse Practitioner)  Indicate any recent Medical Services you may have received from other than [facility] providers in the past year (date may be approximate).     Assessment:   This is a routine wellness examination for Aleecia.  Hearing/Vision screen Hearing Screening - Comments:: Needs hearing aids, but can't afford  Vision Screening - Comments:: Needs eye exam (last one ~2 yrs ago), Dr. Julianne Rice [place]   Goals Addressed               This Visit's Progress     Patient Stated (pt-stated)        Lose 30 lbs       Depression Screen     11/16/2023    8:51 AM 10/26/2023    9:14 AM 09/23/2023    9:19 AM 06/18/2023    8:19 AM 06/15/2023   10:10 AM 02/11/2023    1:13 PM 11/11/2022    9:25 AM  PHQ 2/9 Scores  PHQ - 2 Score 0 2 6 0 3 2 2   PHQ- 9 Score 0 3 15 0 11 8 6     Fall Risk     11/16/2023    8:55 AM 10/26/2023    9:14 AM 06/18/2023    8:18 AM 06/15/2023   10:10 AM 02/11/2023    1:11 PM  Fall Risk   Falls in the  past year? 1 0 0 1 1  Number falls in past yr: 0 0 0 1 0  Injury with Fall? 1 0 0 1 1  Risk for fall due to : History of fall(s);Impaired balance/gait;Orthopedic patient  No Fall Risks  History of fall(s)   Follow up Falls prevention discussed;Falls evaluation completed;Education provided  Falls evaluation completed  Falls evaluation completed    MEDICARE RISK AT HOME:  Medicare Risk at Home Any stairs in or around the home?: Yes (1 step) If so, are there any without handrails?: Yes Home free of loose throw rugs in walkways, pet beds, electrical cords, etc?: Yes Adequate lighting in your home to reduce risk of falls?: Yes Life alert?: No Use of a cane, walker or w/c?: No Grab bars in the bathroom?: No Shower chair or bench in shower?: No Elevated toilet seat or a handicapped toilet?: No  TIMED UP AND GO:  Was the test performed?  No  Cognitive Function: 6CIT completed        11/16/2023    8:57 AM  6CIT Screen  What Year? 0 points  What month? 0 points  What time? 0 points  Count back from 20 0 points  Months in reverse 0 points  Repeat phrase 0 points  Total Score 0 points    Immunizations Immunization History  Administered Date(s) Administered   Fluad Quad(high Dose 65+) 03/31/2021, 05/20/2022   Fluad Trivalent(High Dose 65+) 05/20/2023   Influenza, High Dose Seasonal PF 05/23/2018   Influenza,inj,Quad PF,6+ Mos 05/20/2022   Influenza-Unspecified 05/20/2023   Janssen (J&J) SARS-COV-2 Vaccination 11/13/2019   Moderna Covid-19 Fall Seasonal Vaccine 67yrs & older 05/20/2022, 05/11/2023   Moderna Covid-19 Vaccine Bivalent Booster 74yrs & up 06/22/2021   Moderna Sars-Covid-2 Vaccination 07/31/2020, 11/03/2020, 03/31/2021, 06/22/2022, 05/13/2023   PNEUMOCOCCAL CONJUGATE-20 05/20/2022   Pneumococcal Conjugate-13 04/12/2020   Pneumococcal Polysaccharide-23 06/22/2017   Rsv, Bivalent, Protein Subunit Rsvpref,pf Verdis Frederickson) 05/20/2022   Tdap 08/12/2022   Zoster Recombinant(Shingrix) 04/13/2020   Zoster, Live 06/09/2016    Screening Tests Health Maintenance  Topic Date Due   COVID-19 Vaccine (8 - 2024-25 season) 11/10/2023   Lung Cancer Screening  12/02/2023   DEXA SCAN   09/10/2024   Medicare Annual Wellness (AWV)  11/15/2024   MAMMOGRAM  11/07/2025   Fecal DNA (Cologuard)  06/27/2026   DTaP/Tdap/Td (2 - Td or Tdap) 08/12/2032   Pneumonia Vaccine 45+ Years old  Completed   INFLUENZA VACCINE  Completed   Hepatitis C Screening  Completed   HPV VACCINES  Aged Out   Zoster Vaccines- Shingrix  Discontinued    Health Maintenance  Health Maintenance Due  Topic Date Due   COVID-19 Vaccine (8 - 2024-25 season) 11/10/2023   Lung Cancer Screening  12/02/2023   Health Maintenance Items Addressed: See Nurse Notes  Additional Screening:  Vision Screening: Recommended annual ophthalmology exams for early detection of glaucoma and other disorders of the eye.  Dental Screening: Recommended annual dental exams for proper oral hygiene  Community Resource Referral / Chronic Care Management: CRR required this visit?  No   CCM required this visit?  No     Plan:     I have personally reviewed and noted the following in the patient's chart:   Medical and social history Use of alcohol, tobacco or illicit drugs  Current medications and supplements including opioid prescriptions. Patient is not currently taking opioid prescriptions. Functional ability and status Nutritional status Physical activity Advanced directives List of other physicians Hospitalizations, surgeries,  and ER visits in previous 12 months Vitals Screenings to include cognitive, depression, and falls Referrals and appointments  In addition, I have reviewed and discussed with patient certain preventive protocols, quality metrics, and best practice recommendations. A written personalized care plan for preventive services as well as general preventive health recommendations were provided to patient.     Tora Kindred, CMA   11/16/2023   After Visit Summary: (MyChart) Due to this being a telephonic visit, the after visit summary with patients personalized plan was offered to patient via  MyChart   Notes:  Needs routine eye exam. Patient will call Dr. Clydene Pugh to schedule. LDCT scheduled for 12/06/23

## 2023-11-16 NOTE — Patient Instructions (Addendum)
 Ms. Giannelli , Thank you for taking time to come for your Medicare Wellness Visit. I appreciate your ongoing commitment to your health goals. Please review the following plan we discussed and let me know if I can assist you in the future.   Referrals/Orders/Follow-Ups/Clinician Recommendations: Call Dr. Leonides Cave office to schedule a routine eye exam at your convenience.  This is a list of the screening recommended for you and due dates:  Health Maintenance  Topic Date Due   COVID-19 Vaccine (8 - 2024-25 season) 11/10/2023   Screening for Lung Cancer  12/02/2023   DEXA scan (bone density measurement)  09/10/2024   Mammogram  11/07/2024   Medicare Annual Wellness Visit  11/15/2024   Cologuard (Stool DNA test)  06/27/2026   DTaP/Tdap/Td vaccine (2 - Td or Tdap) 08/12/2032   Pneumonia Vaccine  Completed   Flu Shot  Completed   Hepatitis C Screening  Completed   HPV Vaccine  Aged Out   Zoster (Shingles) Vaccine  Discontinued    Advanced directives: (Copy Requested) Please bring a copy of your health care power of attorney and living will to the office to be added to your chart at your convenience. You can mail to [facility] [address] [place], [place] [address] or email to [email or web address]  Next Medicare Annual Wellness Visit scheduled for next year: Yes, 11/21/24 @ 10:40am  Fall Prevention in the Home, Adult Falls can cause injuries and affect people of all ages. There are many simple things that you can do to make your home safe and to help prevent falls. If you need it, ask for help making these changes. What actions can I take to prevent falls? General information Use good lighting in all rooms. Make sure to: Replace any light bulbs that burn out. Turn on lights if it is dark and use night-lights. Keep items that you use often in easy-to-reach places. Lower the shelves around your home if needed. Move furniture so that there are clear paths around  it. Do not keep throw rugs or other things on the floor that can make you trip. If any of your floors are uneven, fix them. Add color or contrast paint or tape to clearly mark and help you see: Grab bars or handrails. First and last steps of staircases. Where the edge of each step is. If you use a ladder or stepladder: Make sure that it is fully opened. Do not climb a closed ladder. Make sure the sides of the ladder are locked in place. Have someone hold the ladder while you use it. Know where your pets are as you move through your home. What can I do in the bathroom?     Keep the floor dry. Clean up any water that is on the floor right away. Remove soap buildup in the bathtub or shower. Buildup makes bathtubs and showers slippery. Use non-skid mats or decals on the floor of the bathtub or shower. Attach bath mats securely with double-sided, non-slip rug tape. If you need to sit down while you are in the shower, use a non-slip stool. Install grab bars by the toilet and in the bathtub and shower. Do not use towel bars as grab bars. What can I do in the bedroom? Make sure that you have a light by your bed that is easy to reach. Do not use any sheets or blankets on your bed that hang to the floor. Have a firm bench or chair with side arms that  you can use for support when you get dressed. What can I do in the kitchen? Clean up any spills right away. If you need to reach something above you, use a sturdy step stool that has a grab bar. Keep electrical cables out of the way. Do not use floor polish or wax that makes floors slippery. What can I do with my stairs? Do not leave anything on the stairs. Make sure that you have a light switch at the top and the bottom of the stairs. Have them installed if you do not have them. Make sure that there are handrails on both sides of the stairs. Fix handrails that are broken or loose. Make sure that handrails are as long as the staircases. Install  non-slip stair treads on all stairs in your home if they do not have carpet. Avoid having throw rugs at the top or bottom of stairs, or secure the rugs with carpet tape to prevent them from moving. Choose a carpet design that does not hide the edge of steps on the stairs. Make sure that carpet is firmly attached to the stairs. Fix any carpet that is loose or worn. What can I do on the outside of my home? Use bright outdoor lighting. Repair the edges of walkways and driveways and fix any cracks. Clear paths of anything that can make you trip, such as tools or rocks. Add color or contrast paint or tape to clearly mark and help you see high doorway thresholds. Trim any bushes or trees on the main path into your home. Check that handrails are securely fastened and in good repair. Both sides of all steps should have handrails. Install guardrails along the edges of any raised decks or porches. Have leaves, snow, and ice cleared regularly. Use sand, salt, or ice melt on walkways during winter months if you live where there is ice and snow. In the garage, clean up any spills right away, including grease or oil spills. What other actions can I take? Review your medicines with your health care provider. Some medicines can make you confused or feel dizzy. This can increase your chance of falling. Wear closed-toe shoes that fit well and support your feet. Wear shoes that have rubber soles and low heels. Use a cane, walker, scooter, or crutches that help you move around if needed. Talk with your provider about other ways that you can decrease your risk of falls. This may include seeing a physical therapist to learn to do exercises to improve movement and strength. Where to find more information Centers for Disease Control and Prevention, STEADI: TonerPromos.no [employer] on Aging: BaseRingTones.pl National Institute on Aging: BaseRingTones.pl Contact a health care provider if: You are afraid of falling at home. You  feel weak, drowsy, or dizzy at home. You fall at home. Get help right away if you: Lose consciousness or have trouble moving after a fall. Have a fall that causes a head injury. These symptoms may be an emergency. Get help right away. Call 911. Do not wait to see if the symptoms will go away. Do not drive yourself to the hospital. This information is not intended to replace advice given to you by your health care provider. Make sure you discuss any questions you have with your health care provider. Document Revised: 04/20/2022 Document Reviewed: 04/20/2022 Elsevier Patient Education  2024 [employer].

## 2023-11-22 ENCOUNTER — Telehealth: Payer: Self-pay

## 2023-11-22 NOTE — Progress Notes (Signed)
   11/22/2023  Patient ID: Cassandra Brandt, female   DOB: 01/07/1956, 68 y.o.   MRN: [id number]  Contacted AZ&Me PAP to check on processing status of Breztri application.  Enrollment approved 11/15/2023-08/30/2024.  Shipment went into processing 3/18 and arrive at patient's home in 7-10 business days.  She is also enrolled in automatic refills.  Sending patient a MyChart message to make her aware.  Lenna Gilford, PharmD, DPLA

## 2023-11-25 DIAGNOSIS — M47816 Spondylosis without myelopathy or radiculopathy, lumbar region: Secondary | ICD-10-CM | POA: Diagnosis not present

## 2023-12-02 ENCOUNTER — Telehealth: Admitting: Nurse Practitioner

## 2023-12-06 ENCOUNTER — Ambulatory Visit
Admission: RE | Admit: 2023-12-06 | Discharge: 2023-12-06 | Disposition: A | Discharge status: Home / Self Care | Payer: Medicare Other | Source: Ambulatory Visit | Attending: Acute Care | Admitting: Acute Care

## 2023-12-06 DIAGNOSIS — F17210 Nicotine dependence, cigarettes, uncomplicated: Secondary | ICD-10-CM | POA: Diagnosis not present

## 2023-12-06 DIAGNOSIS — Z87891 Personal history of nicotine dependence: Secondary | ICD-10-CM | POA: Diagnosis not present

## 2023-12-06 DIAGNOSIS — Z122 Encounter for screening for malignant neoplasm of respiratory organs: Secondary | ICD-10-CM | POA: Diagnosis not present

## 2023-12-10 ENCOUNTER — Other Ambulatory Visit: Payer: Self-pay | Admitting: Nurse Practitioner

## 2023-12-10 NOTE — Telephone Encounter (Signed)
 Requested Prescriptions  Pending Prescriptions Disp Refills   pantoprazole (PROTONIX) 40 MG tablet [Pharmacy Med Name: PANTOPRAZOLE SOD DR 40 MG TAB] 180 tablet 0    Sig: Take 1 tablet (40 mg total) by mouth 2 (two) times daily.     Gastroenterology: Proton Pump Inhibitors Passed - 12/10/2023  4:28 PM      Passed - Valid encounter within last 12 months    Recent Outpatient Visits           1 month ago Encounter for screening mammogram for malignant neoplasm of breast   [facility] Larae Grooms, NP       Future Appointments             In 1 month Larae Grooms, NP  [facility]

## 2023-12-24 DIAGNOSIS — M5416 Radiculopathy, lumbar region: Secondary | ICD-10-CM | POA: Diagnosis not present

## 2023-12-24 DIAGNOSIS — M47816 Spondylosis without myelopathy or radiculopathy, lumbar region: Secondary | ICD-10-CM | POA: Diagnosis not present

## 2023-12-24 DIAGNOSIS — M5441 Lumbago with sciatica, right side: Secondary | ICD-10-CM | POA: Diagnosis not present

## 2023-12-24 DIAGNOSIS — G8929 Other chronic pain: Secondary | ICD-10-CM | POA: Diagnosis not present

## 2023-12-28 ENCOUNTER — Ambulatory Visit
Admission: RE | Admit: 2023-12-28 | Discharge: 2023-12-28 | Disposition: A | Discharge status: Home / Self Care | Source: Ambulatory Visit | Attending: Physical Medicine & Rehabilitation | Admitting: Physical Medicine & Rehabilitation

## 2023-12-28 ENCOUNTER — Other Ambulatory Visit: Payer: Self-pay | Admitting: Physical Medicine & Rehabilitation

## 2023-12-28 DIAGNOSIS — M48061 Spinal stenosis, lumbar region without neurogenic claudication: Secondary | ICD-10-CM | POA: Diagnosis not present

## 2023-12-28 DIAGNOSIS — M47816 Spondylosis without myelopathy or radiculopathy, lumbar region: Secondary | ICD-10-CM | POA: Diagnosis not present

## 2023-12-28 DIAGNOSIS — M5416 Radiculopathy, lumbar region: Secondary | ICD-10-CM

## 2023-12-29 ENCOUNTER — Other Ambulatory Visit: Payer: Self-pay | Admitting: Nurse Practitioner

## 2024-01-01 NOTE — Telephone Encounter (Signed)
 Requested medication (s) are due for refill today: Yes  Requested medication (s) are on the active medication list: Yes  Last refill:  06/18/23  Future visit scheduled: Yes  Notes to clinic:  Unable to refill per protocol due to failed labs, no updated results.      Requested Prescriptions  Pending Prescriptions Disp Refills   rosuvastatin  (CRESTOR ) 5 MG tablet [Pharmacy Med Name: ROSUVASTATIN  CALCIUM  5 MG TAB] 90 tablet 0    Sig: Take 1 tablet (5 mg total) by mouth daily.     Cardiovascular:  Antilipid - Statins 2 Failed - 01/01/2024  1:29 AM      Failed - Lipid Panel in normal range within the last 12 months    Cholesterol, Total  Date Value Ref Range Status  08/12/2022 202 (H) 100 - 199 mg/dL Final   LDL Chol Calc (NIH)  Date Value Ref Range Status  08/12/2022 132 (H) 0 - 99 mg/dL Final   HDL  Date Value Ref Range Status  08/12/2022 38 (L) >39 mg/dL Final   Triglycerides  Date Value Ref Range Status  08/12/2022 176 (H) 0 - 149 mg/dL Final         Passed - Cr in normal range and within 360 days    Creatinine  Date Value Ref Range Status  09/23/2023 134.4 20.0 - 300.0 mg/dL Final   Creatinine, Ser  Date Value Ref Range Status  06/15/2023 0.66 0.57 - 1.00 mg/dL Final         Passed - Patient is not pregnant      Passed - Valid encounter within last 12 months    Recent Outpatient Visits           2 months ago Encounter for screening mammogram for malignant neoplasm of breast   [facility] Aileen Alexanders, NP       Future Appointments             In 3 weeks Aileen Alexanders, NP [facility]

## 2024-01-04 ENCOUNTER — Ambulatory Visit: Admitting: Neurosurgery

## 2024-01-07 ENCOUNTER — Other Ambulatory Visit: Payer: Self-pay

## 2024-01-07 ENCOUNTER — Encounter: Payer: Self-pay | Admitting: Nurse Practitioner

## 2024-01-07 DIAGNOSIS — F17210 Nicotine dependence, cigarettes, uncomplicated: Secondary | ICD-10-CM

## 2024-01-07 DIAGNOSIS — Z122 Encounter for screening for malignant neoplasm of respiratory organs: Secondary | ICD-10-CM

## 2024-01-07 DIAGNOSIS — Z87891 Personal history of nicotine dependence: Secondary | ICD-10-CM

## 2024-01-17 NOTE — Progress Notes (Deleted)
 Referring Physician:  Jolie Neat, MD [address] [place],  [place] [address]  Primary Physician:  Aileen Alexanders, NP  History of Present Illness: 01/17/2024 [name] is here today with a chief complaint of ***  Back and leg pain?  Duration: *** Location: *** Quality: *** Severity: ***  Precipitating: aggravated by *** Modifying factors: made better by *** Weakness: none Timing: *** Bowel/Bladder Dysfunction: none  Conservative measures:  Physical therapy: *** has not participated in? Multimodal medical therapy including regular antiinflammatories: *** gabapentin, flexeril  Injections: ***  11/25/2023: RFA to the bilateral L4-5 and L5-S1 facet joints 10/28/2023: Right L4-5 and L5-S1 transforaminal ESI (50% relief) 09/06/2023: MBB to the bilateral L4-5 and L5-S1 facet joints (85% relief) 08/19/2023: MBB to the bilateral L4-5 and L5-S1 facet joints (100% relief) 07/22/2023: Right L4-5 and L5-S1 transforaminal ESI (85% relief) 04/15/2023: Right L4-5 and L5-S1 transforaminal ESI (85% relief)   Past Surgery: ***no spinal surgeries   Cassandra Brandt has ***no symptoms of cervical myelopathy.  The symptoms are causing a significant impact on the patient's life.   Review of Systems:  A 10 point review of systems is negative, except for the pertinent positives and negatives detailed in the HPI.  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   Restless leg syndrome    Sleep apnea    Vertigo    Wears dentures    full upper and lower    Past Surgical History: Past Surgical History:  Procedure Laterality Date   BIOPSY  08/16/2023   Procedure:  BIOPSY;  Surgeon: Luke Salaam, MD;  Location: [facility] ENDOSCOPY;  Service: Gastroenterology;;   CESAREAN SECTION     ESOPHAGOGASTRODUODENOSCOPY (EGD) WITH PROPOFOL  N/A 08/16/2023   Procedure: ESOPHAGOGASTRODUODENOSCOPY (EGD) WITH PROPOFOL ;  Surgeon: Luke Salaam, MD;  Location: [facility] ENDOSCOPY;  Service: Gastroenterology;  Laterality: N/A;   FRACTURE SURGERY     GASTRIC BYPASS     HARDWARE REMOVAL Left 03/30/2023   Procedure: Left distal radius DVR plate, hardware, removal;  Surgeon: Molli Angelucci, MD;  Location: [facility];  Service: Orthopedics;  Laterality: Left;   OPEN REDUCTION INTERNAL FIXATION (ORIF) DISTAL RADIAL FRACTURE Left 12/29/2022   Procedure: OPEN REDUCTION INTERNAL FIXATION (ORIF) DISTAL RADIUS FRACTURE;  Surgeon: Molli Angelucci, MD;  Location: [facility];  Service: Orthopedics;  Laterality: Left;   TONSILLECTOMY     TUBAL LIGATION      Allergies: Allergies as of 01/18/2024 - Review Complete 11/16/2023  Allergen Reaction Noted   Penicillins Hives 05/01/2013   Codeine Hives, Itching, and Nausea Only 03/12/2012   Levaquin [levofloxacin in d5w] Other (See Comments) 03/12/2012   Lisinopril Cough 06/07/2020   Oxycodone-acetaminophen  Nausea Only 06/07/2020   Tramadol  Nausea Only 04/28/2021   Oxycodone Itching 05/08/2013    Medications: Outpatient Encounter Medications as of 01/18/2024  Medication Sig   amLODipine  (NORVASC ) 5 MG tablet Take 1 tablet (5 mg total) by mouth daily.   aspirin EC 81 MG tablet Take 81 mg by mouth daily. Swallow whole.   BREZTRI  AEROSPHERE 160-9-4.8 MCG/ACT AERO Inhale 2 puffs into the lungs 2 (two) times  daily.   cetirizine (ZYRTEC) 10 MG tablet Take 10 mg by mouth daily.   Clotrimazole  1 % OINT Apply 1 application  topically daily.   DULoxetine  (CYMBALTA ) 60 MG capsule Take 1 capsule (60 mg total) by mouth daily.   gabapentin (NEURONTIN) 300 MG capsule Take 300 mg by mouth 2 (two) times daily. Take 2 tablets daily 600 mg total    pantoprazole  (PROTONIX ) 40 MG tablet Take 1 tablet (40 mg total) by mouth 2 (two) times daily.   rosuvastatin  (CRESTOR ) 5 MG tablet Take 1 tablet (5 mg total) by mouth daily.   temazepam  (RESTORIL ) 30 MG capsule Take 1 capsule (30 mg total) by mouth at bedtime.   valsartan  (DIOVAN ) 160 MG tablet Take 1 tablet (160 mg total) by mouth daily.   No facility-administered encounter medications on file as of 01/18/2024.    Social History: Social History   Tobacco Use   Smoking status: Every Day    Current packs/day: 0.50    Average packs/day: 0.5 packs/day for 51.4 years (25.7 ttl pk-yrs)    Types: Cigarettes    Start date: 1974   Smokeless tobacco: Never  Vaping Use   Vaping status: Never Used  Substance Use Topics   Alcohol use: No   Drug use: No    Family Medical History: Family History  Problem Relation Age of Onset   COPD Mother    Diabetes Mother    Pneumonia Mother    Huntington's disease Father     Physical Examination: @VITALWITHPAIN @  General: Patient is well developed, well nourished, calm, collected, and in no apparent distress. Attention to examination is appropriate.  Psychiatric: Patient is non-anxious.  Head:  Pupils equal, round, and reactive to light.  ENT:  Oral mucosa appears well hydrated.  Neck:   Supple.  ***Full range of motion.  Respiratory: Patient is breathing without any difficulty.  Extremities: No edema.  Vascular: Palpable dorsal pedal pulses.  Skin:   On exposed skin, there are no abnormal skin lesions.  NEUROLOGICAL:     Awake, alert, oriented to person, place, and time.  Speech is clear and fluent. Fund of knowledge is appropriate.   Cranial Nerves: Pupils equal round and reactive to light.  Facial tone is symmetric.  Facial sensation is symmetric.  ROM of spine: ***full.  Palpation of spine: ***non tender.    Strength: Side Biceps Triceps Deltoid Interossei Grip Wrist Ext. Wrist Flex.  R 5 5 5 5 5 5 5   L 5 5 5 5 5 5 5     Side Iliopsoas Quads Hamstring PF DF EHL  R 5 5 5 5 5 5   L 5 5 5 5 5 5    Reflexes are ***2+ and symmetric at the biceps, triceps, brachioradialis, patella and achilles.   Hoffman's is absent.  Clonus is not present.  Toes are down-going.  Bilateral upper and lower extremity sensation is intact to light touch.    Gait is normal.   No difficulty with tandem gait.   No evidence of dysmetria noted.  Medical Decision Making  Imaging: ***  I have personally reviewed the images and agree with the above interpretation.  Assessment and Plan: [name] is a pleasant 68 y.o. female with ***    Thank you for involving me in the care of this patient.   I spent a total of *** minutes in both face-to-face and non-face-to-face activities for this visit on the date of this encounter.   Anastacio Karvonen Dept. of Neurosurgery

## 2024-01-18 ENCOUNTER — Ambulatory Visit: Admitting: Neurosurgery

## 2024-01-18 NOTE — Progress Notes (Signed)
 Referring Physician:  Jolie Neat, MD [address] [place],  [place] [address]  Primary Physician:  Cassandra Alexanders, NP  History of Present Illness: 01/19/2024 [name] is here today with a chief complaint of pain that originates in her back radiates down her buttocks to the right lower extremity.  This will go to her knee and also down the front of her leg to the top and side of her foot.  It is worse when extended.  It is associate with numbness and tingling.  Also has difficulty with weakness.  She has been feeling contralateral hip pain.  She was previously taking gabapentin but was not getting significant relief.  She has been since taken off.  She is not taking any NSAIDs because she has had a previous gastric bypass.  She does feel like her symptoms are continuing to worsen.  She worked with physical medicine and rehabilitation and had a home exercise regiment.  Conservative measures:  Physical therapy: She has been being followed by physical medicine and rehabilitation who has been prescribing home exercises in addition to their pain interventions. Multimodal medical therapy including regular antiinflammatories: gabapentin, flexeril , tylenol , CBD cream, (can't take ibuprofen ), biofreeze, lidocaine  patches Injections:   11/25/2023: RFA to the bilateral L4-5 and L5-S1 facet joints 10/28/2023: Right L4-5 and L5-S1 transforaminal ESI (50% relief) 09/06/2023: MBB to the bilateral L4-5 and L5-S1 facet joints (85% relief) 08/19/2023: MBB to the bilateral L4-5 and L5-S1 facet joints (100% relief) 07/22/2023: Right L4-5 and L5-S1 transforaminal ESI (85% relief) 04/15/2023: Right L4-5 and L5-S1 transforaminal ESI (85% relief)   Past Surgery: no spinal surgeries   The symptoms are causing a significant impact on the patient's life.   I have utilized the care everywhere function in epic to review the outside records available from external health systems.  Review of  Systems:  A 10 point review of systems is negative, except for the pertinent positives and negatives detailed in the HPI.  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 ENDOSCOPY;  Service: Gastroenterology;  Laterality: N/A;   FRACTURE SURGERY     GASTRIC BYPASS     HARDWARE REMOVAL Left 03/30/2023   Procedure: Left distal radius DVR plate, hardware, removal;  Surgeon: Cassandra Angelucci, MD;  Location: [facility];  Service: Orthopedics;  Laterality: Left;   OPEN REDUCTION INTERNAL FIXATION (ORIF) DISTAL RADIAL FRACTURE Left 12/29/2022   Procedure: OPEN REDUCTION INTERNAL FIXATION (ORIF) DISTAL RADIUS FRACTURE;  Surgeon: Cassandra Angelucci, MD;  Location: [facility];  Service: Orthopedics;  Laterality: Left;   TONSILLECTOMY  TUBAL LIGATION      Allergies: Allergies as of 01/19/2024 - Review Complete 01/19/2024  Allergen Reaction Noted   Penicillins Hives 05/01/2013   Codeine Hives, Itching, and Nausea Only 03/12/2012   Levaquin [levofloxacin in d5w] Other (See Comments) 03/12/2012   Lisinopril Cough 06/07/2020   Oxycodone-acetaminophen  Nausea Only 06/07/2020   Tramadol  Nausea Only  04/28/2021   Oxycodone Itching 05/08/2013    Medications:  Current Outpatient Medications:    amLODipine  (NORVASC ) 5 MG tablet, Take 1 tablet (5 mg total) by mouth daily., Disp: 90 tablet, Rfl: 1   aspirin EC 81 MG tablet, Take 81 mg by mouth daily. Swallow whole., Disp: , Rfl:    BREZTRI  AEROSPHERE 160-9-4.8 MCG/ACT AERO, Inhale 2 puffs into the lungs 2 (two) times daily., Disp: 3 each, Rfl: 4   cetirizine (ZYRTEC) 10 MG tablet, Take 10 mg by mouth daily., Disp: , Rfl:    Clotrimazole  1 % OINT, Apply 1 application  topically daily., Disp: 56.7 g, Rfl: 1   DULoxetine  (CYMBALTA ) 60 MG capsule, Take 1 capsule (60 mg total) by mouth daily., Disp: 90 capsule, Rfl: 1   pantoprazole  (PROTONIX ) 40 MG tablet, Take 1 tablet (40 mg total) by mouth 2 (two) times daily., Disp: 180 tablet, Rfl: 0   rosuvastatin  (CRESTOR ) 5 MG tablet, Take 1 tablet (5 mg total) by mouth daily., Disp: 90 tablet, Rfl: 1   temazepam  (RESTORIL ) 30 MG capsule, Take 1 capsule (30 mg total) by mouth at bedtime., Disp: 30 capsule, Rfl: 2   valsartan  (DIOVAN ) 160 MG tablet, Take 1 tablet (160 mg total) by mouth daily., Disp: 90 tablet, Rfl: 1  Social History: Social History   Tobacco Use   Smoking status: Every Day    Current packs/day: 0.50    Average packs/day: 0.5 packs/day for 51.4 years (25.7 ttl pk-yrs)    Types: Cigarettes    Start date: 1974   Smokeless tobacco: Never  Vaping Use   Vaping status: Never Used  Substance Use Topics   Alcohol use: No   Drug use: No    Family Medical History: Family History  Problem Relation Age of Onset   COPD Mother    Diabetes Mother    Pneumonia Mother    Huntington's disease Father     Physical Examination: Vitals:   01/19/24 1307  BP: 122/82    General: Patient is in no apparent distress. Attention to examination is appropriate.  Neck:   Supple.  Full range of motion.  Respiratory: Patient is breathing without any difficulty.   NEUROLOGICAL:     Awake,  alert, oriented to person, place, and time.  Speech is clear and fluent.   Cranial Nerves: Pupils equal round and reactive to light.  Facial tone is symmetric.  Facial sensation is symmetric. Shoulder shrug is symmetric. Tongue protrusion is midline.    Strength:  Side Iliopsoas Quads Hamstring PF DF EHL  R 5 5 4+ 5 4+ 4+  L 5 5 5 5 5 5    Reflexes are normal and symmetric with the exception of the right sided medial hamstring and Achilles reflex are slightly blunted when compared to the contralateral side.  Positive straight leg raise which reproduces her symptoms, decreased sensation in the L3-5 distribution     Demonstrates to very mild hip drop  Imaging: Narrative & Impression  CLINICAL DATA:  Worsening back and right leg pain and numbness.   EXAM: MRI LUMBAR SPINE WITHOUT CONTRAST   TECHNIQUE: Multiplanar, multisequence MR imaging of  the lumbar spine was performed. No intravenous contrast was administered.   COMPARISON:  MRI 03/31/2023   FINDINGS: Segmentation: There are five lumbar type vertebral bodies. The last full intervertebral disc space is labeled L5-S1. This correlates with the prior study.   Alignment: Stable mild multilevel degenerative listhesis. The overall alignment is maintained.   Vertebrae: Endplate reactive changes but no bone lesions or fractures.   Conus medullaris and cauda equina: Conus extends to the L1 level. Conus and cauda equina appear normal.   Paraspinal and other soft tissues: No significant paraspinal or retroperitoneal findings.   Disc levels:   T12-L1: No significant findings.   L1-2: Degenerative disc disease with disc desiccation, bulging annulus and osteophytic ridging. This in conjunction with facet disease and ligamentum flavum thickening contributes to mild right and moderate left lateral recess stenosis. No significant spinal foraminal stenosis. Findings appear stable.   L2-3: Stable degenerative disc disease and  facet disease with right-sided disc osteophyte complex causing mass effect on the right L3 nerve root in the lateral recess. No significant spinal or foraminal stenosis. No significant change.   L3-4: Advanced degenerative disc disease with marked disc space narrowing and osteophytic ridging. Stable moderate facet disease. Mild bilateral lateral recess encroachment but no significant spinal or foraminal stenosis. No interval change.   L4-5: Stable degenerative disc disease and facet disease with a bulging disc and osteophytic ridging contributing to mild spinal stenosis and moderate bilateral lateral recess stenosis, left greater than right. No foraminal stenosis. No significant change.   L5-S1: Advanced facet disease but no disc protrusions, spinal or foraminal stenosis.   IMPRESSION: 1. Stable degenerative lumbar spondylosis with multilevel disc disease and facet disease. 2. Stable mild right and moderate left lateral recess stenosis at L1-2. 3. Stable right-sided disc osteophyte complex at L2-3 causing mass effect on the right L3 nerve root in the lateral recess. 4. Stable mild bilateral lateral recess encroachment at L3-4. 5. Stable mild spinal stenosis and moderate bilateral lateral recess stenosis, left greater than right at L4-5.     Electronically Signed   By: Marrian Siva M.D.   On: 01/14/2024 11:05      I have personally reviewed the images and agree with the above interpretation.  Although she has some widespread disease noted in the above MRI, the right sided 2 3 disc osteophyte complex demonstrates compression of the right-sided L3 nerve which would correlate with the pain radiating from her back and buttocks down to her knee.  The lateral recess stenosis at 3 4 is quite mild and does not appear to be clearly compressive, however her lateral recess stenosis at L4-5 on the right is significant and likely correlates with her L5 symptomatology.  Medical Decision  Making/Assessment and Plan: Ms. Miyasaki is a pleasant 68 y.o. female with history of right sided lower extremity pain numbness tingling and weakness for approximately 1 year.  It has been worsening.  She has been working with physical medicine and rehabilitation and has had multiple injections in her lumbar spine, she is at the point where she is no longer getting significant relief in these locations.  On physical examination she has very mild weakness with decreased reflexes on the right side at the Achilles and medial hamstrings.  She has a positive straight leg raise.  She has a mild hip drop.  Her MRI demonstrates multilevel stenosis worse at L2-3 and L4-5, she has little to no left-sided symptoms, most of her symptoms are on the right and  a radicular pattern which includes her knee and anterior leg to the top and side of her foot.  She seems to be most symptomatic in the L3 distribution and L5 distribution, this also correlates with the areas of were stenosis.  We discussed a L2-3 and L4-5 hemilaminectomy discectomy and decompression of the traversing nerve roots, we did discuss that she has collapsed disc spaces and multilevel degenerative disease, however in order to treat all of her imaging findings she is likely necessitate multiple interbody fusions which in the setting of her relatively low midline back pain we like to avoid also with her smoking history.  She would just like to avoid a fusion surgery if at all possible.  I do feel like she would benefit from a decompressive lumbar intervention as she does have signs and symptoms consistent with lumbar radiculopathy, she has worked with physical medicine and rehabilitation for both medical management and injection management, and also was doing home w exercise routine through them.  Will plan on getting flexion-extension x-rays to ensure that there is no excess motion noted, we did discuss that should this fail to resolve her symptoms she would likely end  up necessitating an indirect decompression in the form of a spinal fusion.  We also discussed that given her age and smoking history, she would be a slightly increased risk of CSF leak given likely decreased strength of her dura.  Thank you for involving me in the care of this patient.    Carroll Clamp MD/MSCR Neurosurgery

## 2024-01-18 NOTE — H&P (View-Only) (Signed)
 Referring Physician:  Jolie Neat, MD [address] [place],  [place] [address]  Primary Physician:  Aileen Alexanders, NP  History of Present Illness: 01/19/2024 Cassandra Brandt is here today with a chief complaint of pain that originates in her back radiates down her buttocks to the right lower extremity.  This will go to her knee and also down the front of her leg to the top and side of her foot.  It is worse when extended.  It is associate with numbness and tingling.  Also has difficulty with weakness.  She has been feeling contralateral hip pain.  She was previously taking gabapentin but was not getting significant relief.  She has been since taken off.  She is not taking any NSAIDs because she has had a previous gastric bypass.  She does feel like her symptoms are continuing to worsen.  She worked with physical medicine and rehabilitation and had a home exercise regiment.  Conservative measures:  Physical therapy: She has been being followed by physical medicine and rehabilitation who has been prescribing home exercises in addition to their pain interventions. Multimodal medical therapy including regular antiinflammatories: gabapentin, flexeril , tylenol , CBD cream, (can't take ibuprofen ), biofreeze, lidocaine  patches Injections:   11/25/2023: RFA to the bilateral L4-5 and L5-S1 facet joints 10/28/2023: Right L4-5 and L5-S1 transforaminal ESI (50% relief) 09/06/2023: MBB to the bilateral L4-5 and L5-S1 facet joints (85% relief) 08/19/2023: MBB to the bilateral L4-5 and L5-S1 facet joints (100% relief) 07/22/2023: Right L4-5 and L5-S1 transforaminal ESI (85% relief) 04/15/2023: Right L4-5 and L5-S1 transforaminal ESI (85% relief)   Past Surgery: no spinal surgeries   The symptoms are causing a significant impact on the patient's life.   I have utilized the care everywhere function in epic to review the outside records available from external health systems.  Review of  Systems:  A 10 point review of systems is negative, except for the pertinent positives and negatives detailed in the HPI.  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 ENDOSCOPY;  Service: Gastroenterology;  Laterality: N/A;   FRACTURE SURGERY     GASTRIC BYPASS     HARDWARE REMOVAL Left 03/30/2023   Procedure: Left distal radius DVR plate, hardware, removal;  Surgeon: Molli Angelucci, MD;  Location: [facility];  Service: Orthopedics;  Laterality: Left;   OPEN REDUCTION INTERNAL FIXATION (ORIF) DISTAL RADIAL FRACTURE Left 12/29/2022   Procedure: OPEN REDUCTION INTERNAL FIXATION (ORIF) DISTAL RADIUS FRACTURE;  Surgeon: Molli Angelucci, MD;  Location: [facility];  Service: Orthopedics;  Laterality: Left;   TONSILLECTOMY  TUBAL LIGATION      Allergies: Allergies as of 01/19/2024 - Review Complete 01/19/2024  Allergen Reaction Noted   Penicillins Hives 05/01/2013   Codeine Hives, Itching, and Nausea Only 03/12/2012   Levaquin [levofloxacin in d5w] Other (See Comments) 03/12/2012   Lisinopril Cough 06/07/2020   Oxycodone-acetaminophen  Nausea Only 06/07/2020   Tramadol  Nausea Only  04/28/2021   Oxycodone Itching 05/08/2013    Medications:  Current Outpatient Medications:    amLODipine  (NORVASC ) 5 MG tablet, Take 1 tablet (5 mg total) by mouth daily., Disp: 90 tablet, Rfl: 1   aspirin EC 81 MG tablet, Take 81 mg by mouth daily. Swallow whole., Disp: , Rfl:    BREZTRI  AEROSPHERE 160-9-4.8 MCG/ACT AERO, Inhale 2 puffs into the lungs 2 (two) times daily., Disp: 3 each, Rfl: 4   cetirizine (ZYRTEC) 10 MG tablet, Take 10 mg by mouth daily., Disp: , Rfl:    Clotrimazole  1 % OINT, Apply 1 application  topically daily., Disp: 56.7 g, Rfl: 1   DULoxetine  (CYMBALTA ) 60 MG capsule, Take 1 capsule (60 mg total) by mouth daily., Disp: 90 capsule, Rfl: 1   pantoprazole  (PROTONIX ) 40 MG tablet, Take 1 tablet (40 mg total) by mouth 2 (two) times daily., Disp: 180 tablet, Rfl: 0   rosuvastatin  (CRESTOR ) 5 MG tablet, Take 1 tablet (5 mg total) by mouth daily., Disp: 90 tablet, Rfl: 1   temazepam  (RESTORIL ) 30 MG capsule, Take 1 capsule (30 mg total) by mouth at bedtime., Disp: 30 capsule, Rfl: 2   valsartan  (DIOVAN ) 160 MG tablet, Take 1 tablet (160 mg total) by mouth daily., Disp: 90 tablet, Rfl: 1  Social History: Social History   Tobacco Use   Smoking status: Every Day    Current packs/day: 0.50    Average packs/day: 0.5 packs/day for 51.4 years (25.7 ttl pk-yrs)    Types: Cigarettes    Start date: 1974   Smokeless tobacco: Never  Vaping Use   Vaping status: Never Used  Substance Use Topics   Alcohol use: No   Drug use: No    Family Medical History: Family History  Problem Relation Age of Onset   COPD Mother    Diabetes Mother    Pneumonia Mother    Huntington's disease Father     Physical Examination: Vitals:   01/19/24 1307  BP: 122/82    General: Patient is in no apparent distress. Attention to examination is appropriate.  Neck:   Supple.  Full range of motion.  Respiratory: Patient is breathing without any difficulty.   NEUROLOGICAL:     Awake,  alert, oriented to person, place, and time.  Speech is clear and fluent.   Cranial Nerves: Pupils equal round and reactive to light.  Facial tone is symmetric.  Facial sensation is symmetric. Shoulder shrug is symmetric. Tongue protrusion is midline.    Strength:  Side Iliopsoas Quads Hamstring PF DF EHL  R 5 5 4+ 5 4+ 4+  L 5 5 5 5 5 5    Reflexes are normal and symmetric with the exception of the right sided medial hamstring and Achilles reflex are slightly blunted when compared to the contralateral side.  Positive straight leg raise which reproduces her symptoms, decreased sensation in the L3-5 distribution     Demonstrates to very mild hip drop  Imaging: Narrative & Impression  CLINICAL DATA:  Worsening back and right leg pain and numbness.   EXAM: MRI LUMBAR SPINE WITHOUT CONTRAST   TECHNIQUE: Multiplanar, multisequence MR imaging of  the lumbar spine was performed. No intravenous contrast was administered.   COMPARISON:  MRI 03/31/2023   FINDINGS: Segmentation: There are five lumbar type vertebral bodies. The last full intervertebral disc space is labeled L5-S1. This correlates with the prior study.   Alignment: Stable mild multilevel degenerative listhesis. The overall alignment is maintained.   Vertebrae: Endplate reactive changes but no bone lesions or fractures.   Conus medullaris and cauda equina: Conus extends to the L1 level. Conus and cauda equina appear normal.   Paraspinal and other soft tissues: No significant paraspinal or retroperitoneal findings.   Disc levels:   T12-L1: No significant findings.   L1-2: Degenerative disc disease with disc desiccation, bulging annulus and osteophytic ridging. This in conjunction with facet disease and ligamentum flavum thickening contributes to mild right and moderate left lateral recess stenosis. No significant spinal foraminal stenosis. Findings appear stable.   L2-3: Stable degenerative disc disease and  facet disease with right-sided disc osteophyte complex causing mass effect on the right L3 nerve root in the lateral recess. No significant spinal or foraminal stenosis. No significant change.   L3-4: Advanced degenerative disc disease with marked disc space narrowing and osteophytic ridging. Stable moderate facet disease. Mild bilateral lateral recess encroachment but no significant spinal or foraminal stenosis. No interval change.   L4-5: Stable degenerative disc disease and facet disease with a bulging disc and osteophytic ridging contributing to mild spinal stenosis and moderate bilateral lateral recess stenosis, left greater than right. No foraminal stenosis. No significant change.   L5-S1: Advanced facet disease but no disc protrusions, spinal or foraminal stenosis.   IMPRESSION: 1. Stable degenerative lumbar spondylosis with multilevel disc disease and facet disease. 2. Stable mild right and moderate left lateral recess stenosis at L1-2. 3. Stable right-sided disc osteophyte complex at L2-3 causing mass effect on the right L3 nerve root in the lateral recess. 4. Stable mild bilateral lateral recess encroachment at L3-4. 5. Stable mild spinal stenosis and moderate bilateral lateral recess stenosis, left greater than right at L4-5.     Electronically Signed   By: Marrian Siva M.D.   On: 01/14/2024 11:05      I have personally reviewed the images and agree with the above interpretation.  Although she has some widespread disease noted in the above MRI, the right sided 2 3 disc osteophyte complex demonstrates compression of the right-sided L3 nerve which would correlate with the pain radiating from her back and buttocks down to her knee.  The lateral recess stenosis at 3 4 is quite mild and does not appear to be clearly compressive, however her lateral recess stenosis at L4-5 on the right is significant and likely correlates with her L5 symptomatology.  Medical Decision  Making/Assessment and Plan: Cassandra Brandt is a pleasant 68 y.o. female with history of right sided lower extremity pain numbness tingling and weakness for approximately 1 year.  It has been worsening.  She has been working with physical medicine and rehabilitation and has had multiple injections in her lumbar spine, she is at the point where she is no longer getting significant relief in these locations.  On physical examination she has very mild weakness with decreased reflexes on the right side at the Achilles and medial hamstrings.  She has a positive straight leg raise.  She has a mild hip drop.  Her MRI demonstrates multilevel stenosis worse at L2-3 and L4-5, she has little to no left-sided symptoms, most of her symptoms are on the right and  a radicular pattern which includes her knee and anterior leg to the top and side of her foot.  She seems to be most symptomatic in the L3 distribution and L5 distribution, this also correlates with the areas of were stenosis.  We discussed a L2-3 and L4-5 hemilaminectomy discectomy and decompression of the traversing nerve roots, we did discuss that she has collapsed disc spaces and multilevel degenerative disease, however in order to treat all of her imaging findings she is likely necessitate multiple interbody fusions which in the setting of her relatively low midline back pain we like to avoid also with her smoking history.  She would just like to avoid a fusion surgery if at all possible.  I do feel like she would benefit from a decompressive lumbar intervention as she does have signs and symptoms consistent with lumbar radiculopathy, she has worked with physical medicine and rehabilitation for both medical management and injection management, and also was doing home w exercise routine through them.  Will plan on getting flexion-extension x-rays to ensure that there is no excess motion noted, we did discuss that should this fail to resolve her symptoms she would likely end  up necessitating an indirect decompression in the form of a spinal fusion.  We also discussed that given her age and smoking history, she would be a slightly increased risk of CSF leak given likely decreased strength of her dura.  Thank you for involving me in the care of this patient.    Carroll Clamp MD/MSCR Neurosurgery

## 2024-01-19 ENCOUNTER — Ambulatory Visit: Admitting: Neurosurgery

## 2024-01-19 ENCOUNTER — Ambulatory Visit
Admission: RE | Admit: 2024-01-19 | Discharge: 2024-01-19 | Disposition: A | Discharge status: Home / Self Care | Attending: Neurosurgery | Admitting: Neurosurgery

## 2024-01-19 ENCOUNTER — Other Ambulatory Visit: Payer: Self-pay

## 2024-01-19 ENCOUNTER — Ambulatory Visit
Admission: RE | Admit: 2024-01-19 | Discharge: 2024-01-19 | Disposition: A | Discharge status: Home / Self Care | Source: Ambulatory Visit | Attending: Neurosurgery | Admitting: Neurosurgery

## 2024-01-19 ENCOUNTER — Encounter: Payer: Self-pay | Admitting: Neurosurgery

## 2024-01-19 VITALS — BP 122/82 | Ht 64.0 in | Wt 192.0 lb

## 2024-01-19 DIAGNOSIS — M5416 Radiculopathy, lumbar region: Secondary | ICD-10-CM | POA: Diagnosis not present

## 2024-01-19 DIAGNOSIS — M47816 Spondylosis without myelopathy or radiculopathy, lumbar region: Secondary | ICD-10-CM | POA: Diagnosis not present

## 2024-01-19 DIAGNOSIS — M48062 Spinal stenosis, lumbar region with neurogenic claudication: Secondary | ICD-10-CM

## 2024-01-19 DIAGNOSIS — M545 Low back pain, unspecified: Secondary | ICD-10-CM | POA: Diagnosis not present

## 2024-01-19 DIAGNOSIS — Z01818 Encounter for other preprocedural examination: Secondary | ICD-10-CM

## 2024-01-19 NOTE — Patient Instructions (Signed)
 Please see below for information in regards to your upcoming surgery:   Planned surgery: L2-3 Hemi laminoectomy with L4-5 Discectomy   Surgery date: 02/08/2024  at [facility] (Medical Mall: [address] [place], [place] [address]) - you will find out your arrival time the business day before your surgery.   Pre-op appointment at [facility] Pre-admit Testing: you will receive a call with a date/time for this appointment. If you are scheduled for an in person appointment, Pre-admit Testing is located on the first floor of the [facility] building, [address], [address]. During this appointment, they will advise you which medications you can take the morning of surgery, and which medications you will need to hold for surgery. Labs (such as blood work, EKG) may be done at your pre-op appointment. You are not required to fast for these labs. Should you need to change your pre-op appointment, please call Pre-admit testing at [phone].    Blood thinners:   Aspirin 81mg :stop aspirin 7 days prior, resume aspirin 14 days after    Common restrictions after surgery: No bending, lifting, or twisting ("BLT"). Avoid lifting objects heavier than 10 pounds for the first 6 weeks after surgery. Where possible, avoid household activities that involve lifting, bending, reaching, pushing, or pulling such as laundry, vacuuming, grocery shopping, and childcare. Try to arrange for help from friends and family for these activities while you heal. Do not drive while taking prescription pain medication. Weeks 6 through 12 after surgery: avoid lifting more than 25 pounds.   How to contact us :  If you have any questions/concerns before or after surgery, you can reach us  at [phone], or you can send a mychart message. We can be reached by phone or mychart 8am-4pm, Monday-Friday.  *Please note: Calls after 4pm are forwarded to a third party answering service. Mychart  messages are not routinely monitored during evenings, weekends, and holidays. Please call our office to contact the answering service for urgent concerns during non-business hours.   Appointments/FMLA & disability paperwork: Gerlean Kocher, & Maryann Smalls Registered Nurses/Surgery schedulers: Kendelyn & Verleen Stuckey Medical Assistants: Donnajean Fuse Physician Assistants: Ludwig Safer, PA-C, Anastacio Karvonen, PA-C & Lucetta Russel, PA-C Surgeons: Jodeen Munch, MD & Henderson Lock, MD   [facility] PREADMIT TESTING VISIT and SURGERY INFORMATION SHEET   Now that surgery has been scheduled you can anticipate several phone calls from [facility] services. A pharmacy technician will call you to verify your current list of medications taken at home.               The Pre-Service Center will call to verify your insurance information and to give you billing estimates and information.             The Preadmit Testing Office will be calling to schedule a visit to obtain information for the anesthesia team and provide instructions on preparation for surgery.  What can you expect for the Preadmit Testing Visit: Appointments may be scheduled in-person or by telephone.  If a telephone visit is scheduled, you may be asked to come into the office to have lab tests or other studies performed.   This visit will not be completed any greater than 14 days prior to your surgery.  If your surgery has been scheduled for a future date, please do not be alarmed if we have not contacted you to schedule an appointment more than a month prior to the surgery date.  Please be prepared to provide the following information during this appointment:            -Personal medical history                                               -Medication and allergy list            -Any history of problems with anesthesia              -Recent lab work or diagnostic studies            -Please notify us  of any needs we should be  aware of to provide the best care possible           -You will be provided with instructions on how to prepare for your surgery.    On The Day of Surgery:  You must have a driver to take you home after surgery, you will be asked not to drive for 24 hours following surgery.  Taxi, [employer] and non-medical transport will not be acceptable means of transportation unless you have a responsible individual who will be traveling with you.  Visitors in the surgical area:   2 people will be able to visit you in your room once your preparation for surgery has been completed. During surgery, your visitors will be asked to wait in the Surgery Waiting Area.  It is not a requirement for them to stay, if they prefer to leave and come back.  Your visitor(s) will be given an update once the surgery has been completed.  No visitors are allowed in the initial recovery room to respect patient privacy and safety.  Once you are more awake and transfer to the secondary recovery area, or are transferred to an inpatient room, visitors will again be able to see you.  To respect and protect your privacy: We will ask on the day of surgery who your driver will be and what the contact number for that individual will be. We will ask if it is okay to share information with this individual, or if there is an alternative individual that we, or the surgeon, should contact to provide updates and information. If family or friends come to the surgical information desk requesting information about you, who you have not listed with us , no information will be given.   It may be helpful to designate someone as the main contact who will be responsible for updating your other friends and family.    PREADMIT TESTING OFFICE: [phone] SAME DAY SURGERY: [phone] We look forward to caring for you before and throughout the process of your surgery.

## 2024-01-19 NOTE — Progress Notes (Deleted)
   Left leg with somet hitngling  Right leg is the woirst.   Back ?   Right back to buttcheekc down to sleg.   Hard to stand for a long time  Waekness slight,   L5 regiona.   Back pain too.   300mg  TID, taken off. Cabt teake nsais due to bypass gastic    Lumbar flex ex    4+  Right hip drop    Right 2-3, 4-5.

## 2024-01-20 NOTE — Telephone Encounter (Signed)
 I called patient to notify her surgery is approved and her home health exercises were sufficient for documentation.

## 2024-01-25 ENCOUNTER — Encounter: Payer: Self-pay | Admitting: Nurse Practitioner

## 2024-01-25 ENCOUNTER — Ambulatory Visit (INDEPENDENT_AMBULATORY_CARE_PROVIDER_SITE_OTHER): Payer: Medicare Other | Admitting: Nurse Practitioner

## 2024-01-25 VITALS — BP 107/68 | HR 87 | Temp 98.1°F | Resp 17 | Ht 64.02 in | Wt 192.0 lb

## 2024-01-25 DIAGNOSIS — D509 Iron deficiency anemia, unspecified: Secondary | ICD-10-CM | POA: Diagnosis not present

## 2024-01-25 DIAGNOSIS — I10 Essential (primary) hypertension: Secondary | ICD-10-CM | POA: Diagnosis not present

## 2024-01-25 DIAGNOSIS — J449 Chronic obstructive pulmonary disease, unspecified: Secondary | ICD-10-CM | POA: Diagnosis not present

## 2024-01-25 DIAGNOSIS — F419 Anxiety disorder, unspecified: Secondary | ICD-10-CM

## 2024-01-25 DIAGNOSIS — F5101 Primary insomnia: Secondary | ICD-10-CM | POA: Diagnosis not present

## 2024-01-25 MED ORDER — ROSUVASTATIN CALCIUM 10 MG PO TABS: 10.0000 mg | ORAL_TABLET | Freq: Every day | ORAL | Status: DC

## 2024-01-25 MED ORDER — AMLODIPINE BESYLATE 5 MG PO TABS: 5.0000 mg | ORAL_TABLET | Freq: Every day | ORAL | 1 refills | Status: DC

## 2024-01-25 MED ORDER — TEMAZEPAM 30 MG PO CAPS: 30.0000 mg | ORAL_CAPSULE | Freq: Every day | ORAL | 2 refills | Status: DC

## 2024-01-25 MED ORDER — VALSARTAN 160 MG PO TABS: 160.0000 mg | ORAL_TABLET | Freq: Every day | ORAL | 1 refills | Status: AC

## 2024-01-25 MED ORDER — PANTOPRAZOLE SODIUM 40 MG PO TBEC: 40.0000 mg | DELAYED_RELEASE_TABLET | Freq: Two times a day (BID) | ORAL | 1 refills | Status: AC

## 2024-01-25 MED ORDER — DULOXETINE HCL 60 MG PO CPEP: 60.0000 mg | ORAL_CAPSULE | Freq: Every day | ORAL | 1 refills | Status: DC

## 2024-01-25 NOTE — Assessment & Plan Note (Signed)
 Labs ordered at visit today.  Will make recommendations based on lab results.  Has upcoming surgery with Neurosurgery.  Labs ordered today.  Reviewed most recent note with specialist.

## 2024-01-25 NOTE — Assessment & Plan Note (Signed)
Chronic. Controlled.  Continue to check blood pressures at home.  Consistently running 130/70s.  Continue with Valsartan and Amlodipine.  Refills sent today.  Labs ordered.  Follow up in 3 months.  Call sooner if concerns arise.

## 2024-01-25 NOTE — Progress Notes (Signed)
 BP 107/68 (BP Location: Left Arm, Patient Position: Sitting, Cuff Size: Large)   Pulse 87   Temp 98.1 F (36.7 C) (Oral)   Resp 17   Ht 5' 4.02" (1.626 m)   Wt 192 lb (87.1 kg)   SpO2 96%   BMI 32.94 kg/m    Subjective:    Patient ID: Cassandra Brandt, female    DOB: 22-Dec-1955, 68 y.o.   MRN: [id number]  HPI: Cassandra Brandt is a 68 y.o. female  Chief Complaint  Patient presents with   COPD    Breathing doing good no concerns.    Hypertension    Running great at home. 122/70's is normal for her.    INSOMNIA Doing well with the Temazepam .  Denies concerns at visit today.  Duration: years Satisfied with sleep quality: yes Difficulty falling asleep: no Difficulty staying asleep: no Waking a few hours after sleep onset: no Early morning awakenings: no Daytime hypersomnolence: yes Wakes feeling refreshed: yes Good sleep hygiene: yes Apnea: no Snoring: yes Depressed/anxious mood: yes Recent stress: yes Restless legs/nocturnal leg cramps: no Chronic pain/arthritis: no History of sleep study: no Treatments attempted: temazepam    DEPRESSION Patient states she is in a better place.  She is worried about her upcoming surgery.  She has had a lot going on.   Denies SI.   Flowsheet Row Office Visit from 01/25/2024 in [facility]  PHQ-9 Total Score 6         01/25/2024    9:44 AM 10/26/2023    9:14 AM 09/23/2023    9:18 AM 06/18/2023    8:19 AM  GAD 7 : Generalized Anxiety Score  Nervous, Anxious, on Edge 1 1 3  0  Control/stop worrying 1 1 3  0  Worry too much - different things 1 1 3  0  Trouble relaxing 1 1 2  0  Restless 1 1 1  0  Easily annoyed or irritable 1 1 3  0  Afraid - awful might happen 0 1 0 0  Total GAD 7 Score 6 7 15  0  Anxiety Difficulty  Somewhat difficult  Not difficult at all    COPD Using Breztri  and doing well.   COPD status: controlled Satisfied with current treatment?: yes Oxygen use: no Dyspnea frequency:  none Cough  frequency: none Rescue inhaler frequency:  none Limitation of activity: yes Productive cough: none Last Spirometry:  Pneumovax: Up to Date Influenza: Up to Date   Relevant past medical, surgical, family and social history reviewed and updated as indicated. Interim medical history since our last visit reviewed. Allergies and medications reviewed and updated.  Review of Systems  Psychiatric/Behavioral:  Positive for dysphoric mood and sleep disturbance. Negative for self-injury. The patient is nervous/anxious.     Per HPI unless specifically indicated above     Objective:    BP 107/68 (BP Location: Left Arm, Patient Position: Sitting, Cuff Size: Large)   Pulse 87   Temp 98.1 F (36.7 C) (Oral)   Resp 17   Ht 5' 4.02" (1.626 m)   Wt 192 lb (87.1 kg)   SpO2 96%   BMI 32.94 kg/m   Wt Readings from Last 3 Encounters:  01/25/24 192 lb (87.1 kg)  01/19/24 192 lb (87.1 kg)  12/06/23 180 lb (81.6 kg)    Physical Exam Vitals and nursing note reviewed.  Constitutional:      General: She is not in acute distress.    Appearance: Normal appearance. She is not ill-appearing,  toxic-appearing or diaphoretic.  HENT:     Head: Normocephalic.     Right Ear: External ear normal.     Left Ear: External ear normal.     Nose: Nose normal.     Mouth/Throat:     Mouth: Mucous membranes are moist.     Pharynx: Oropharynx is clear.  Eyes:     General:        Right eye: No discharge.        Left eye: No discharge.     Extraocular Movements: Extraocular movements intact.     Conjunctiva/sclera: Conjunctivae normal.     Pupils: Pupils are equal, round, and reactive to light.  Cardiovascular:     Rate and Rhythm: Normal rate and regular rhythm.     Heart sounds: No murmur heard. Pulmonary:     Effort: Pulmonary effort is normal. No respiratory distress.     Breath sounds: Normal breath sounds. No wheezing or rales.  Musculoskeletal:     Cervical back: Normal range of motion and neck  supple.  Skin:    General: Skin is warm and dry.     Capillary Refill: Capillary refill takes less than 2 seconds.  Neurological:     General: No focal deficit present.     Mental Status: She is alert and oriented to person, place, and time. Mental status is at baseline.  Psychiatric:        Mood and Affect: Mood normal. Affect is tearful.        Behavior: Behavior normal.        Thought Content: Thought content normal.        Judgment: Judgment normal.     Results for orders placed or performed in visit on 09/23/23  [id number] 11+Oxyco+Alc+Crt-Bund   Collection Time: 09/23/23  9:33 AM  Result Value Ref Range   Ethanol Negative Cutoff=0.020 %   Amphetamines, Urine Negative Cutoff=1000 ng/mL   Barbiturate Negative Cutoff=200 ng/mL   BENZODIAZ UR QL See Final Results Cutoff=200 ng/mL   Cannabinoid Quant, Ur See Final Results Cutoff=50 ng/mL   Cocaine (Metabolite) Negative Cutoff=300 ng/mL   OPIATE SCREEN URINE Negative Cutoff=300 ng/mL   Oxycodone/Oxymorphone, Urine Negative Cutoff=300 ng/mL   Phencyclidine Negative Cutoff=25 ng/mL   Methadone Screen, Urine Negative Cutoff=300 ng/mL   Propoxyphene Negative Cutoff=300 ng/mL   Meperidine Negative Cutoff=200 ng/mL   Tramadol  Negative Cutoff=200 ng/mL   Creatinine 134.4 20.0 - 300.0 mg/dL   pH, Urine 5.9 4.5 - 8.9  Benzodiazepines Confirm, Urine   Collection Time: 09/23/23  9:33 AM  Result Value Ref Range   Benzodiazepines Positive (A) Cutoff=200 ng/mL   Nordiazepam Negative Cutoff=100   Oxazepam Positive (A)    Oxazepam Conf 3,718 Cutoff=100 ng/mL   Flurazepam Negative Cutoff=100   Lorazepam Negative Cutoff=100   Alprazolam Negative Cutoff=100   Clonazepam Negative Cutoff=100   Temazepam  Positive (A)    Temazepam  Conf. 32,010 Cutoff=100 ng/mL   Triazolam Negative Cutoff=100   Midazolam  Negative Cutoff=100  Cannabinoid Conf, Ur   Collection Time: 09/23/23  9:33 AM  Result Value Ref Range   CANNABINOIDS Positive (A) Cutoff=50    Carboxy THC GC/MS Conf 272 Cutoff=15 ng/mL      Assessment & Plan:   Problem List Items Addressed This Visit       Cardiovascular and Mediastinum   Essential hypertension   Chronic. Controlled.  Continue to check blood pressures at home.  Consistently running 130/70s.  Continue with Valsartan  and Amlodipine .  Refills sent today.  Labs  ordered.  Follow up in 3 months.  Call sooner if concerns arise.       Relevant Medications   rosuvastatin  (CRESTOR ) 10 MG tablet   amLODipine  (NORVASC ) 5 MG tablet   valsartan  (DIOVAN ) 160 MG tablet   Other Relevant Orders   Comprehensive metabolic panel with GFR   Lipid panel     Respiratory   Chronic obstructive pulmonary disease, unspecified (HCC) - Primary   Chronic. Well controlled.  CCM was successful at getting Breztri  via PAP.  Continue with Breztri .  Not using any albuterol .  Follow up in 3 months.  Call sooner if concerns arise.       Relevant Orders   Lipid panel     Other   Anxiety   Chronic. Doing well with Duloxetine  60mg .  Continue with current medication regimen.   Follow up in 3 months.  Call sooner if concerns arise.       Relevant Medications   DULoxetine  (CYMBALTA ) 60 MG capsule   Other Relevant Orders   Lipid panel   Iron deficiency anemia   Labs ordered at visit today.  Will make recommendations based on lab results.  Has upcoming surgery with Neurosurgery.  Labs ordered today.  Reviewed most recent note with specialist.       Relevant Orders   CBC With Diff/Platelet   Primary insomnia   Chronic. Controlled with Temazepam .  Patient aware of risks of taking medication and okay with continuing.  UDS and controlled substance agreement up to date. Refills sent today.  Side effects and benefits of medication discussed during visit.  Follow up in 3 months.        Follow up plan: Return in about 3 months (around 04/26/2024) for HTN, HLD, DM2 FU.

## 2024-01-25 NOTE — Assessment & Plan Note (Signed)
 Chronic. Controlled with Temazepam .  Patient aware of risks of taking medication and okay with continuing.  UDS and controlled substance agreement up to date. Refills sent today.  Side effects and benefits of medication discussed during visit.  Follow up in 3 months.

## 2024-01-25 NOTE — Assessment & Plan Note (Signed)
 Chronic. Well controlled.  CCM was successful at getting Breztri  via PAP.  Continue with Breztri .  Not using any albuterol .  Follow up in 3 months.  Call sooner if concerns arise.

## 2024-01-25 NOTE — Assessment & Plan Note (Signed)
 Chronic. Doing well with Duloxetine  60mg .  Continue with current medication regimen.   Follow up in 3 months.  Call sooner if concerns arise.

## 2024-01-26 ENCOUNTER — Ambulatory Visit: Payer: Self-pay | Admitting: Nurse Practitioner

## 2024-01-26 LAB — COMPREHENSIVE METABOLIC PANEL WITH GFR
ALT: 11 IU/L (ref 0–32)
AST: 16 IU/L (ref 0–40)
Albumin: 4.2 g/dL (ref 3.9–4.9)
Alkaline Phosphatase: 95 IU/L (ref 44–121)
BUN/Creatinine Ratio: 18 (ref 12–28)
BUN: 11 mg/dL (ref 8–27)
Bilirubin Total: 0.3 mg/dL (ref 0.0–1.2)
CO2: 22 mmol/L (ref 20–29)
Calcium: 9.5 mg/dL (ref 8.7–10.3)
Chloride: 105 mmol/L (ref 96–106)
Creatinine, Ser: 0.62 mg/dL (ref 0.57–1.00)
Globulin, Total: 2.2 g/dL (ref 1.5–4.5)
Glucose: 78 mg/dL (ref 70–99)
Potassium: 4.1 mmol/L (ref 3.5–5.2)
Sodium: 143 mmol/L (ref 134–144)
Total Protein: 6.4 g/dL (ref 6.0–8.5)
eGFR: 97 mL/min/{1.73_m2} (ref >59)

## 2024-01-26 LAB — LIPID PANEL
Chol/HDL Ratio: 2.9 ratio (ref 0.0–4.4)
Cholesterol, Total: 126 mg/dL (ref 100–199)
HDL: 44 mg/dL (ref >39)
LDL Chol Calc (NIH): 64 mg/dL (ref 0–99)
Triglycerides: 95 mg/dL (ref 0–149)
VLDL Cholesterol Cal: 18 mg/dL (ref 5–40)

## 2024-01-26 LAB — CBC WITH DIFF/PLATELET
Basophils Absolute: 0 10*3/uL (ref 0.0–0.2)
Basos: 1 %
EOS (ABSOLUTE): 0.1 10*3/uL (ref 0.0–0.4)
Eos: 2 %
Hematocrit: 45.9 % (ref 34.0–46.6)
Hemoglobin: 14.9 g/dL (ref 11.1–15.9)
Immature Grans (Abs): 0 10*3/uL (ref 0.0–0.1)
Immature Granulocytes: 0 %
Lymphocytes Absolute: 2.7 10*3/uL (ref 0.7–3.1)
Lymphs: 32 %
MCH: 30.3 pg (ref 26.6–33.0)
MCHC: 32.5 g/dL (ref 31.5–35.7)
MCV: 93 fL (ref 79–97)
Monocytes Absolute: 0.5 10*3/uL (ref 0.1–0.9)
Monocytes: 5 %
Neutrophils Absolute: 5.2 10*3/uL (ref 1.4–7.0)
Neutrophils: 60 %
Platelets: 261 10*3/uL (ref 150–450)
RBC: 4.92 x10E6/uL (ref 3.77–5.28)
RDW: 14 % (ref 11.7–15.4)
WBC: 8.7 10*3/uL (ref 3.4–10.8)

## 2024-01-27 ENCOUNTER — Encounter: Payer: Self-pay | Admitting: Neurosurgery

## 2024-01-27 ENCOUNTER — Encounter
Admission: RE | Admit: 2024-01-27 | Discharge: 2024-01-27 | Disposition: A | Discharge status: Home / Self Care | Source: Ambulatory Visit | Attending: Neurosurgery | Admitting: Neurosurgery

## 2024-01-27 ENCOUNTER — Other Ambulatory Visit: Payer: Self-pay

## 2024-01-27 VITALS — BP 120/61 | HR 114 | Temp 97.9°F | Resp 21 | Ht 64.0 in | Wt 191.1 lb

## 2024-01-27 DIAGNOSIS — I4510 Unspecified right bundle-branch block: Secondary | ICD-10-CM | POA: Diagnosis not present

## 2024-01-27 DIAGNOSIS — R8290 Unspecified abnormal findings in urine: Secondary | ICD-10-CM | POA: Diagnosis not present

## 2024-01-27 DIAGNOSIS — Z01818 Encounter for other preprocedural examination: Secondary | ICD-10-CM | POA: Insufficient documentation

## 2024-01-27 DIAGNOSIS — M48062 Spinal stenosis, lumbar region with neurogenic claudication: Secondary | ICD-10-CM | POA: Insufficient documentation

## 2024-01-27 DIAGNOSIS — M5416 Radiculopathy, lumbar region: Secondary | ICD-10-CM | POA: Insufficient documentation

## 2024-01-27 DIAGNOSIS — R8271 Bacteriuria: Secondary | ICD-10-CM | POA: Diagnosis not present

## 2024-01-27 DIAGNOSIS — Z01812 Encounter for preprocedural laboratory examination: Secondary | ICD-10-CM

## 2024-01-27 LAB — URINALYSIS, COMPLETE (UACMP) WITH MICROSCOPIC
Bilirubin Urine: NEGATIVE
Glucose, UA: NEGATIVE mg/dL
Hgb urine dipstick: NEGATIVE
Ketones, ur: 5 mg/dL — AB
Leukocytes,Ua: NEGATIVE
Nitrite: NEGATIVE
Protein, ur: NEGATIVE mg/dL
Specific Gravity, Urine: 1.028 (ref 1.005–1.030)
pH: 5 (ref 5.0–8.0)

## 2024-01-27 LAB — SURGICAL PCR SCREEN
MRSA, PCR: NEGATIVE
Staphylococcus aureus: NEGATIVE

## 2024-01-27 LAB — TYPE AND SCREEN
ABO/RH(D): O POS
Antibody Screen: NEGATIVE

## 2024-01-27 NOTE — Patient Instructions (Addendum)
 Your procedure is scheduled on: Tuesday, February 08, 2024 Report to the Registration Desk on the 1st floor of the [facility] To find out your arrival time, please call [phone] between 1PM - 3PM on:  Monday,June 9/ 2025  If your arrival time is 6:00 am, do not arrive before that time as the [facility] entrance doors do not open until 6:00 am.  REMEMBER: Instructions that are not followed completely may result in serious medical risk, up to and including death; or upon the discretion of your surgeon and anesthesiologist your surgery may need to be rescheduled.  Do not eat food after or drink anything after midnight the night before surgery.  No gum chewing or hard candies.  One week prior to surgery: Stop Anti-inflammatories (NSAIDS) such as Advil , Aleve, Ibuprofen , Motrin , Naproxen, Naprosyn and Aspirin based products such as Excedrin, Goody's Powder, BC Powder. Stop ANY OVER THE COUNTER supplements until after surgery.  You may however, continue to take Tylenol  if needed for pain up until the day of surgery.   **Follow recommendations regarding stopping blood thinners.**  Stop Aspirin 81mg   7 days prior to surgery. Last Dose is  Monday, June 2nd/ 025   Do not take cetirizine (ZYRTEC) on day of surgery   Continue taking all of your other prescription medications up until the day of surgery.  ON THE DAY OF SURGERY ONLY TAKE THESE MEDICATIONS WITH SIPS OF WATER:  amLODipine  (NORVASC )  DULoxetine  (CYMBALTA ) pantoprazole  (PROTONIX ) rosuvastatin  (CRESTOR )   Use inhalers on the day of surgery.   No Alcohol for 24 hours before or after surgery.  No Smoking including e-cigarettes for 24 hours before surgery.  No chewable tobacco products for at least 6 hours before surgery.  No nicotine patches on the day of surgery.  Do not use any "recreational" drugs for at least a week (preferably 2 weeks) before your surgery.  Please be advised that the combination of cocaine and  anesthesia may have negative outcomes, up to and including death. If you test positive for cocaine, your surgery will be cancelled.  On the morning of surgery brush your teeth with toothpaste and water, you may rinse your mouth with mouthwash if you wish. Do not swallow any toothpaste or mouthwash.  Use CHG Soap or wipes as directed on instruction sheet.-provided for you   Do not wear jewelry, make-up, hairpins, clips or nail polish.  For welded (permanent) jewelry: bracelets, anklets, waist bands, etc.  Please have this removed prior to surgery.  If it is not removed, there is a chance that hospital personnel will need to cut it off on the day of surgery.  Do not wear lotions, powders, or perfumes.   Do not shave body hair from the neck down 48 hours before surgery.  Contact lenses, hearing aids and dentures may not be worn into surgery.  Do not bring valuables to the hospital. [facility] is not responsible for any missing/lost belongings or valuables.      Notify your doctor if there is any change in your medical condition (cold, fever, infection).  Wear comfortable clothing (specific to your surgery type) to the hospital.  After surgery, you can help prevent lung complications by doing breathing exercises.  Take deep breaths and cough every 1-2 hours. Your doctor may order a device called an Incentive Spirometer to help you take deep breaths.  If you are being discharged the day of surgery, you will not be allowed to drive home. You will need  a responsible individual to drive you home and stay with you for 24 hours after surgery.     Please call the Pre-admissions Testing Dept. at [phone] if you have any questions about these instructions.  Surgery Visitation Policy:  Patients having surgery or a procedure may have two visitors.  Children under the age of 71 must have an adult with them who is not the patient.    Pre-operative 5 CHG Bath Instructions   You can  play a key role in reducing the risk of infection after surgery. Your skin needs to be as free of germs as possible. You can reduce the number of germs on your skin by washing with CHG (chlorhexidine gluconate) soap before surgery. CHG is an antiseptic soap that kills germs and continues to kill germs even after washing.   DO NOT use if you have an allergy to chlorhexidine/CHG or antibacterial soaps. If your skin becomes reddened or irritated, stop using the CHG and notify one of our RNs at [phone].   Please shower with the CHG soap starting 4 days before surgery using the following schedule:        Please keep in mind the following:  DO NOT shave, including legs and underarms, starting the day of your first shower.   You may shave your face at any point before/day of surgery.  Place clean sheets on your bed the day you start using CHG soap. Use a clean washcloth (not used since being washed) for each shower. DO NOT sleep with pets once you start using the CHG.   CHG Shower Instructions:  If you choose to wash your hair and private area, wash first with your normal shampoo/soap.  After you use shampoo/soap, rinse your hair and body thoroughly to remove shampoo/soap residue.  Turn the water OFF and apply about 3 tablespoons (45 ml) of CHG soap to a CLEAN washcloth.  Apply CHG soap ONLY FROM YOUR NECK DOWN TO YOUR TOES (washing for 3-5 minutes)  DO NOT use CHG soap on face, private areas, open wounds, or sores.  Pay special attention to the area where your surgery is being performed.  If you are having back surgery, having someone wash your back for you may be helpful. Wait 2 minutes after CHG soap is applied, then you may rinse off the CHG soap.  Pat dry with a clean towel  Put on clean clothes/pajamas   If you choose to wear lotion, please use ONLY the CHG-compatible lotions on the back of this paper.     Additional instructions for the day of surgery: DO NOT APPLY any lotions,  deodorants, cologne, or perfumes.   Put on clean/comfortable clothes.  Brush your teeth.  Ask your nurse before applying any prescription medications to the skin.      CHG Compatible Lotions   Aveeno Moisturizing lotion  Cetaphil Moisturizing Cream  Cetaphil Moisturizing Lotion  Clairol Herbal Essence Moisturizing Lotion, Dry Skin  Clairol Herbal Essence Moisturizing Lotion, Extra Dry Skin  Clairol Herbal Essence Moisturizing Lotion, Normal Skin  Curel Age Defying Therapeutic Moisturizing Lotion with Alpha Hydroxy  Curel Extreme Care Body Lotion  Curel Soothing Hands Moisturizing Hand Lotion  Curel Therapeutic Moisturizing Cream, Fragrance-Free  Curel Therapeutic Moisturizing Lotion, Fragrance-Free  Curel Therapeutic Moisturizing Lotion, Original Formula  Eucerin Daily Replenishing Lotion  Eucerin Dry Skin Therapy Plus Alpha Hydroxy Crme  Eucerin Dry Skin Therapy Plus Alpha Hydroxy Lotion  Eucerin Original Crme  Eucerin Original Lotion  Eucerin Plus Crme  Eucerin Plus Lotion  Eucerin TriLipid Replenishing Lotion  Keri Anti-Bacterial Hand Lotion  Keri Deep Conditioning Original Lotion Dry Skin Formula Softly Scented  Keri Deep Conditioning Original Lotion, Fragrance Free Sensitive Skin Formula  Keri Lotion Fast Absorbing Fragrance Free Sensitive Skin Formula  Keri Lotion Fast Absorbing Softly Scented Dry Skin Formula  Keri Original Lotion  Keri Skin Renewal Lotion Keri Silky Smooth Lotion  Keri Silky Smooth Sensitive Skin Lotion  Nivea Body Creamy Conditioning Oil  Nivea Body Extra Enriched Teacher, adult education Moisturizing Lotion Nivea Crme  Nivea Skin Firming Lotion  NutraDerm 30 Skin Lotion  NutraDerm Skin Lotion  NutraDerm Therapeutic Skin Cream  NutraDerm Therapeutic Skin Lotion  ProShield Protective Hand Cream  Provon moisturizing lotion

## 2024-01-28 ENCOUNTER — Ambulatory Visit: Payer: Self-pay | Admitting: Urgent Care

## 2024-01-28 DIAGNOSIS — B9620 Unspecified Escherichia coli [E. coli] as the cause of diseases classified elsewhere: Secondary | ICD-10-CM

## 2024-01-28 DIAGNOSIS — Z01812 Encounter for preprocedural laboratory examination: Secondary | ICD-10-CM

## 2024-01-29 LAB — URINE CULTURE: Culture: >=100000 — AB

## 2024-01-29 MED ORDER — SULFAMETHOXAZOLE-TRIMETHOPRIM 800-160 MG PO TABS: 1.0000 | ORAL_TABLET | Freq: Two times a day (BID) | ORAL | 0 refills | Status: AC

## 2024-01-29 NOTE — Progress Notes (Signed)
 [facility] Perioperative Services: Pre-Admission/Anesthesia Testing  Abnormal Lab Notification and Treatment Plan of Care   Date: 01/29/24  Name: Cassandra Brandt MRN:   [id number]  Re: Abnormal labs noted during PAT appointment   Notified:  Provider Name Provider Role Notification Mode  Henderson Lock, MD Neurosurgery (Surgeon) Routed and/or faxed via [facility]   Abnormal Lab Value(s):   Lab Results  Component Value Date   COLORURINE AMBER (A) 01/27/2024   APPEARANCEUR HAZY (A) 01/27/2024   LABSPEC 1.028 01/27/2024   PHURINE 5.0 01/27/2024   GLUCOSEU NEGATIVE 01/27/2024   HGBUR NEGATIVE 01/27/2024   BILIRUBINUR NEGATIVE 01/27/2024   KETONESUR 5 (A) 01/27/2024   PROTEINUR NEGATIVE 01/27/2024   UROBILINOGEN 1.0 03/12/2012   NITRITE NEGATIVE 01/27/2024   LEUKOCYTESUR NEGATIVE 01/27/2024   EPIU 0-5 01/27/2024   WBCU 0-5 01/27/2024   RBCU 0-5 01/27/2024   BACTERIA MANY (A) 01/27/2024   CULT >=100,000 COLONIES/mL ESCHERICHIA COLI (A) 01/27/2024   Clinical Information and Notes:  Patient is scheduled for an elective MIS L2-3 HEMILAMINECTOMY L4-5 DISCECTOMY on 02/08/2024.    UA performed in PAT consistent with/concerning for infection.  No leukocytosis noted on CBC; WBC 8.7 Renal function: Estimated Creatinine Clearance: 71.7 mL/min (by C-G formula based on SCr of 0.62 mg/dL). Urine C&S added to assess for pathogenically significant growth.  Impression and Plan:  Cassandra Brandt with a UA that was (+) for infection; reflex culture sent. Culture (+) for pathogenically significant Escherichia coli colony count. Contacted patient to discuss. Patient reporting that she is not really having symptoms other than back pain. Unable to determine whether or not this is UTI related or the pain in her lower back for which she is having surgery. Patient with surgery scheduled soon. Bacteruria in lumbar surgery increases patient's risk of complications such as abscess development.  In efforts to avoid delaying patient's procedure, or have her experience any potentially significant perioperative complications related to the aforementioned, I would like to proceed with empiric treatment for urinary tract infection.  Allergies reviewed. Culture report also reviewed to ensure culture appropriate coverage is being provided. Will treat with a 3 day course of SMZ-TMP DS. Patient encouraged to complete the entire course of antibiotics even if she begins to feel better.    Meds ordered this encounter  Medications   sulfamethoxazole -trimethoprim  (BACTRIM  DS) 800-160 MG tablet    Sig: Take 1 tablet by mouth 2 (two) times daily for 3 days. Increase WATER intake while taking this medication.    Dispense:  6 tablet    Refill:  0    Please contact the patient as soon as it is available for pickup. Rx is for preoperative UTI treatment and needs to be started ASAP.   Patient encouraged to increase her fluid intake as much as possible. Discussed that water is always best to flush the urinary tract. She was advised to limit caffeine containing fluids until her infections clears, as caffeine can cause her to experience painful bladder spasms.   May use Tylenol  as needed for pain/fever should she experience these symptoms.   Patient instructed to call surgeon's office or PAT with any questions or concerns related to the above outlined course of treatment. Additionally, she was instructed to call if she feels like she is getting worse overall while on treatment. Results and treatment plan of care forwarded to primary attending surgeon to make them aware.   Encounter Diagnoses  Name Primary?   Pre-operative laboratory examination Yes  E. coli UTI (urinary tract infection)    Renate Caroline, MSN, APRN, FNP-C, CEN [facility]  Perioperative Services Nurse Practitioner Phone: [phone] Fax: [phone] 01/29/24 4:09 PM  NOTE: This note has been prepared using Dragon  dictation software. Despite my best ability to proofread, there is always the potential that unintentional transcriptional errors may still occur from this process.

## 2024-02-03 ENCOUNTER — Ambulatory Visit: Payer: Self-pay | Admitting: Neurosurgery

## 2024-02-05 ENCOUNTER — Encounter: Payer: Self-pay | Admitting: Nurse Practitioner

## 2024-02-07 MED ORDER — ROSUVASTATIN CALCIUM 10 MG PO TABS: 10.0000 mg | ORAL_TABLET | Freq: Every day | ORAL | 1 refills | Status: DC

## 2024-02-08 ENCOUNTER — Encounter
Admission: RE | Disposition: A | Discharge status: Home / Self Care | Payer: Self-pay | Source: Ambulatory Visit | Attending: Neurosurgery

## 2024-02-08 ENCOUNTER — Ambulatory Visit
Admission: RE | Admit: 2024-02-08 | Discharge: 2024-02-08 | Disposition: A | Discharge status: Home / Self Care | Source: Ambulatory Visit | Attending: Neurosurgery | Admitting: Neurosurgery

## 2024-02-08 ENCOUNTER — Ambulatory Visit

## 2024-02-08 ENCOUNTER — Ambulatory Visit: Payer: Self-pay | Admitting: Urgent Care

## 2024-02-08 ENCOUNTER — Other Ambulatory Visit: Payer: Self-pay

## 2024-02-08 ENCOUNTER — Encounter: Payer: Self-pay | Admitting: Neurosurgery

## 2024-02-08 ENCOUNTER — Ambulatory Visit: Admitting: Certified Registered"

## 2024-02-08 DIAGNOSIS — Z79899 Other long term (current) drug therapy: Secondary | ICD-10-CM | POA: Insufficient documentation

## 2024-02-08 DIAGNOSIS — Z7982 Long term (current) use of aspirin: Secondary | ICD-10-CM | POA: Insufficient documentation

## 2024-02-08 DIAGNOSIS — J449 Chronic obstructive pulmonary disease, unspecified: Secondary | ICD-10-CM | POA: Insufficient documentation

## 2024-02-08 DIAGNOSIS — Z9884 Bariatric surgery status: Secondary | ICD-10-CM | POA: Diagnosis not present

## 2024-02-08 DIAGNOSIS — Z01818 Encounter for other preprocedural examination: Secondary | ICD-10-CM

## 2024-02-08 DIAGNOSIS — Z0189 Encounter for other specified special examinations: Secondary | ICD-10-CM | POA: Diagnosis not present

## 2024-02-08 DIAGNOSIS — M5106 Intervertebral disc disorders with myelopathy, lumbar region: Secondary | ICD-10-CM

## 2024-02-08 DIAGNOSIS — M2578 Osteophyte, vertebrae: Secondary | ICD-10-CM | POA: Diagnosis not present

## 2024-02-08 DIAGNOSIS — M48062 Spinal stenosis, lumbar region with neurogenic claudication: Secondary | ICD-10-CM | POA: Diagnosis not present

## 2024-02-08 DIAGNOSIS — I2510 Atherosclerotic heart disease of native coronary artery without angina pectoris: Secondary | ICD-10-CM | POA: Insufficient documentation

## 2024-02-08 DIAGNOSIS — I10 Essential (primary) hypertension: Secondary | ICD-10-CM | POA: Insufficient documentation

## 2024-02-08 DIAGNOSIS — M5416 Radiculopathy, lumbar region: Secondary | ICD-10-CM | POA: Diagnosis present

## 2024-02-08 DIAGNOSIS — F17210 Nicotine dependence, cigarettes, uncomplicated: Secondary | ICD-10-CM | POA: Diagnosis not present

## 2024-02-08 DIAGNOSIS — K219 Gastro-esophageal reflux disease without esophagitis: Secondary | ICD-10-CM | POA: Insufficient documentation

## 2024-02-08 DIAGNOSIS — M4726 Other spondylosis with radiculopathy, lumbar region: Secondary | ICD-10-CM | POA: Insufficient documentation

## 2024-02-08 HISTORY — DX: Spinal stenosis, lumbar region with neurogenic claudication: M48.062

## 2024-02-08 HISTORY — PX: LUMBAR LAMINECTOMY/DECOMPRESSION MICRODISCECTOMY: SHX5026

## 2024-02-08 HISTORY — DX: Intervertebral disc disorders with myelopathy, lumbar region: M51.06

## 2024-02-08 LAB — ABO/RH: ABO/RH(D): O POS

## 2024-02-08 SURGERY — LUMBAR LAMINECTOMY/DECOMPRESSION MICRODISCECTOMY 2 LEVELS
Anesthesia: General | Site: Spine Lumbar | Laterality: Right

## 2024-02-08 MED ORDER — CEFAZOLIN SODIUM-DEXTROSE 2-4 GM/100ML-% IV SOLN
2.0000 g | Freq: Once | INTRAVENOUS | Status: AC
  Administered 2024-02-08: 2 g via INTRAVENOUS

## 2024-02-08 MED ORDER — METHYLPREDNISOLONE ACETATE 40 MG/ML IJ SUSP
INTRAMUSCULAR | Status: AC
  Filled 2024-02-08: qty 1

## 2024-02-08 MED ORDER — SURGIFLO WITH THROMBIN (HEMOSTATIC MATRIX KIT) OPTIME
TOPICAL | Status: DC | PRN
  Administered 2024-02-08: 1 via TOPICAL

## 2024-02-08 MED ORDER — FENTANYL CITRATE (PF) 100 MCG/2ML IJ SOLN: 25.0000 ug | INTRAMUSCULAR | Status: DC | PRN

## 2024-02-08 MED ORDER — DEXAMETHASONE SODIUM PHOSPHATE 10 MG/ML IJ SOLN
INTRAMUSCULAR | Status: DC | PRN
  Administered 2024-02-08: 10 mg via INTRAVENOUS

## 2024-02-08 MED ORDER — EPHEDRINE SULFATE-NACL 50-0.9 MG/10ML-% IV SOSY
PREFILLED_SYRINGE | INTRAVENOUS | Status: DC | PRN
  Administered 2024-02-08 (×2): 5 mg via INTRAVENOUS

## 2024-02-08 MED ORDER — ACETAMINOPHEN 10 MG/ML IV SOLN: 1000.0000 mg | Freq: Once | INTRAVENOUS | Status: DC | PRN

## 2024-02-08 MED ORDER — SUGAMMADEX SODIUM 200 MG/2ML IV SOLN
INTRAVENOUS | Status: DC | PRN
  Administered 2024-02-08: 200 mg via INTRAVENOUS

## 2024-02-08 MED ORDER — DROPERIDOL 2.5 MG/ML IJ SOLN: 0.6250 mg | Freq: Once | INTRAMUSCULAR | Status: DC | PRN

## 2024-02-08 MED ORDER — SODIUM CHLORIDE (PF) 0.9 % IJ SOLN
INTRAMUSCULAR | Status: DC | PRN
  Administered 2024-02-08: 30 mL via INTRAMUSCULAR

## 2024-02-08 MED ORDER — ACETAMINOPHEN 10 MG/ML IV SOLN
INTRAVENOUS | Status: AC
  Filled 2024-02-08: qty 100

## 2024-02-08 MED ORDER — FENTANYL CITRATE (PF) 100 MCG/2ML IJ SOLN
INTRAMUSCULAR | Status: AC
  Filled 2024-02-08: qty 2

## 2024-02-08 MED ORDER — BUPIVACAINE-EPINEPHRINE (PF) 0.5% -1:200000 IJ SOLN
INTRAMUSCULAR | Status: DC | PRN
  Administered 2024-02-08: 10 mL

## 2024-02-08 MED ORDER — PROPOFOL 10 MG/ML IV BOLUS
INTRAVENOUS | Status: DC | PRN
  Administered 2024-02-08: 130 mg via INTRAVENOUS
  Administered 2024-02-08: 20 mg via INTRAVENOUS

## 2024-02-08 MED ORDER — PROPOFOL 10 MG/ML IV BOLUS
INTRAVENOUS | Status: AC
  Filled 2024-02-08: qty 20

## 2024-02-08 MED ORDER — ONDANSETRON HCL 4 MG PO TABS: 4.0000 mg | ORAL_TABLET | Freq: Three times a day (TID) | ORAL | 0 refills | Status: DC | PRN

## 2024-02-08 MED ORDER — SENNA 8.6 MG PO TABS: 1.0000 | ORAL_TABLET | Freq: Every day | ORAL | 0 refills | Status: DC

## 2024-02-08 MED ORDER — CHLORHEXIDINE GLUCONATE 0.12 % MT SOLN
15.0000 mL | Freq: Once | OROMUCOSAL | Status: AC
  Administered 2024-02-08: 15 mL via OROMUCOSAL

## 2024-02-08 MED ORDER — OXYCODONE HCL 5 MG/5ML PO SOLN: 5.0000 mg | Freq: Once | ORAL | Status: DC | PRN

## 2024-02-08 MED ORDER — MIDAZOLAM HCL 2 MG/2ML IJ SOLN
INTRAMUSCULAR | Status: DC | PRN
  Administered 2024-02-08 (×2): 1 mg via INTRAVENOUS

## 2024-02-08 MED ORDER — PROPOFOL 1000 MG/100ML IV EMUL
INTRAVENOUS | Status: AC
Start: 2024-02-08 — End: ?
  Filled 2024-02-08: qty 100

## 2024-02-08 MED ORDER — PHENYLEPHRINE HCL-NACL 20-0.9 MG/250ML-% IV SOLN
INTRAVENOUS | Status: AC
  Filled 2024-02-08: qty 250

## 2024-02-08 MED ORDER — BUPIVACAINE LIPOSOME 1.3 % IJ SUSP
INTRAMUSCULAR | Status: AC
  Filled 2024-02-08: qty 20

## 2024-02-08 MED ORDER — HYDROCODONE-ACETAMINOPHEN 5-325 MG PO TABS: 1.0000 | ORAL_TABLET | Freq: Four times a day (QID) | ORAL | 0 refills | Status: AC | PRN

## 2024-02-08 MED ORDER — FENTANYL CITRATE (PF) 100 MCG/2ML IJ SOLN
INTRAMUSCULAR | Status: DC | PRN
  Administered 2024-02-08 (×2): 50 ug via INTRAVENOUS

## 2024-02-08 MED ORDER — OXYCODONE HCL 5 MG PO TABS: 5.0000 mg | ORAL_TABLET | Freq: Once | ORAL | Status: DC | PRN

## 2024-02-08 MED ORDER — BUPIVACAINE HCL (PF) 0.5 % IJ SOLN
INTRAMUSCULAR | Status: AC
  Filled 2024-02-08: qty 30

## 2024-02-08 MED ORDER — DOCUSATE SODIUM 100 MG PO CAPS: 100.0000 mg | ORAL_CAPSULE | Freq: Two times a day (BID) | ORAL | 0 refills | Status: DC

## 2024-02-08 MED ORDER — LIDOCAINE HCL (CARDIAC) PF 100 MG/5ML IV SOSY
PREFILLED_SYRINGE | INTRAVENOUS | Status: DC | PRN
  Administered 2024-02-08: 100 mg via INTRAVENOUS

## 2024-02-08 MED ORDER — ONDANSETRON HCL 4 MG/2ML IJ SOLN
INTRAMUSCULAR | Status: DC | PRN
Start: 2024-02-08 — End: 2024-02-08
  Administered 2024-02-08: 4 mg via INTRAVENOUS

## 2024-02-08 MED ORDER — LACTATED RINGERS IV SOLN: INTRAVENOUS | Status: DC

## 2024-02-08 MED ORDER — ACETAMINOPHEN 10 MG/ML IV SOLN
INTRAVENOUS | Status: DC | PRN
  Administered 2024-02-08: 1000 mg via INTRAVENOUS

## 2024-02-08 MED ORDER — PHENYLEPHRINE 80 MCG/ML (10ML) SYRINGE FOR IV PUSH (FOR BLOOD PRESSURE SUPPORT)
PREFILLED_SYRINGE | INTRAVENOUS | Status: DC | PRN
  Administered 2024-02-08 (×7): 80 ug via INTRAVENOUS

## 2024-02-08 MED ORDER — SODIUM CHLORIDE (PF) 0.9 % IJ SOLN
INTRAMUSCULAR | Status: AC
  Filled 2024-02-08: qty 10

## 2024-02-08 MED ORDER — SODIUM CHLORIDE (PF) 0.9 % IJ SOLN
INTRAMUSCULAR | Status: AC
  Filled 2024-02-08: qty 20

## 2024-02-08 MED ORDER — ROCURONIUM BROMIDE 100 MG/10ML IV SOLN
INTRAVENOUS | Status: DC | PRN
  Administered 2024-02-08: 10 mg via INTRAVENOUS
  Administered 2024-02-08: 40 mg via INTRAVENOUS
  Administered 2024-02-08: 10 mg via INTRAVENOUS

## 2024-02-08 MED ORDER — MIDAZOLAM HCL 2 MG/2ML IJ SOLN
INTRAMUSCULAR | Status: AC
  Filled 2024-02-08: qty 2

## 2024-02-08 MED ORDER — 0.9 % SODIUM CHLORIDE (POUR BTL) OPTIME
TOPICAL | Status: DC | PRN
  Administered 2024-02-08: 500 mL

## 2024-02-08 MED ORDER — ORAL CARE MOUTH RINSE: 15.0000 mL | Freq: Once | OROMUCOSAL | Status: AC

## 2024-02-08 MED ORDER — CHLORHEXIDINE GLUCONATE 0.12 % MT SOLN
OROMUCOSAL | Status: AC
  Filled 2024-02-08: qty 15

## 2024-02-08 MED ORDER — BUPIVACAINE-EPINEPHRINE (PF) 0.5% -1:200000 IJ SOLN
INTRAMUSCULAR | Status: AC
  Filled 2024-02-08: qty 10

## 2024-02-08 MED ORDER — METHYLPREDNISOLONE ACETATE 40 MG/ML IJ SUSP
INTRAMUSCULAR | Status: DC | PRN
  Administered 2024-02-08: 40 mg via INTRA_ARTICULAR

## 2024-02-08 MED ORDER — CEFAZOLIN SODIUM-DEXTROSE 2-4 GM/100ML-% IV SOLN
INTRAVENOUS | Status: AC
Start: 2024-02-08 — End: ?
  Filled 2024-02-08: qty 100

## 2024-02-08 SURGICAL SUPPLY — 32 items
BASIN KIT SINGLE STR (MISCELLANEOUS) ×1 IMPLANT
BRUSH SCRUB EZ 4% CHG (MISCELLANEOUS) ×1 IMPLANT
BUR NEURO DRILL SOFT 3.0X3.8M (BURR) ×1 IMPLANT
DERMABOND ADVANCED .7 DNX12 (GAUZE/BANDAGES/DRESSINGS) ×1 IMPLANT
DRAPE C-ARM XRAY 36X54 (DRAPES) ×2 IMPLANT
DRAPE LAPAROTOMY 100X77 ABD (DRAPES) ×1 IMPLANT
DRAPE MICROSCOPE SPINE 48X150 (DRAPES) ×1 IMPLANT
DRSG OPSITE POSTOP 3X4 (GAUZE/BANDAGES/DRESSINGS) ×1 IMPLANT
DRSG TEGADERM 4X4.75 (GAUZE/BANDAGES/DRESSINGS) IMPLANT
ELECTRODE EZSTD 165MM 6.5IN (MISCELLANEOUS) ×1 IMPLANT
ELECTRODE REM PT RTRN 9FT ADLT (ELECTROSURGICAL) ×1 IMPLANT
GLOVE BIOGEL PI IND STRL 7.0 (GLOVE) ×1 IMPLANT
GLOVE BIOGEL PI IND STRL 8 (GLOVE) ×2 IMPLANT
GLOVE SURG SYN 7.0 (GLOVE) ×1 IMPLANT
GLOVE SURG SYN 7.0 PF PI (GLOVE) ×1 IMPLANT
GLOVE SURG SYN 7.5 E (GLOVE) ×1 IMPLANT
GLOVE SURG SYN 7.5 PF PI (GLOVE) ×1 IMPLANT
GOWN SRG XL LVL 3 NONREINFORCE (GOWNS) ×1 IMPLANT
GOWN STRL REUS W/ TWL LRG LVL3 (GOWN DISPOSABLE) ×1 IMPLANT
KIT WILSON FRAME (KITS) ×1 IMPLANT
KNIFE BAYONET SHORT DISCETOMY (MISCELLANEOUS) IMPLANT
NDL SAFETY ECLIPSE 18X1.5 (NEEDLE) ×1 IMPLANT
NS IRRIG 500ML POUR BTL (IV SOLUTION) ×1 IMPLANT
PACK LAMINECTOMY ARMC (PACKS) ×1 IMPLANT
PAD ARMBOARD POSITIONER FOAM (MISCELLANEOUS) ×2 IMPLANT
SURGIFLO W/THROMBIN 8M KIT (HEMOSTASIS) ×1 IMPLANT
SUT STRATA 3-0 15 PS-2 (SUTURE) ×1 IMPLANT
SUT VIC AB 0 CT1 27XCR 8 STRN (SUTURE) ×1 IMPLANT
SUT VIC AB 2-0 CT1 18 (SUTURE) ×1 IMPLANT
SYR 30ML LL (SYRINGE) ×2 IMPLANT
SYR 3ML LL SCALE MARK (SYRINGE) ×1 IMPLANT
TRAP FLUID SMOKE EVACUATOR (MISCELLANEOUS) ×1 IMPLANT

## 2024-02-08 NOTE — Anesthesia Preprocedure Evaluation (Addendum)
 Anesthesia Evaluation  Patient identified by MRN, date of birth, ID band Patient awake    Reviewed: Allergy & Precautions, H&P , NPO status , Patient's Chart, lab work & pertinent test results  Airway Mallampati: II  TM Distance: >3 FB Neck ROM: full    Dental  (+) Edentulous Upper, Edentulous Lower   Pulmonary neg shortness of breath, COPD, Current Smoker and Patient abstained from smoking.   Pulmonary exam normal        Cardiovascular Exercise Tolerance: Poor hypertension, (-) angina + CAD (seen on CT scan)  (-) DOE Normal cardiovascular exam     Neuro/Psych  PSYCHIATRIC DISORDERS       Neuromuscular disease    GI/Hepatic Neg liver ROS,GERD  ,,History of Roux-en-Y gastric bypass   Endo/Other  negative endocrine ROS    Renal/GU      Musculoskeletal   Abdominal   Peds  Hematology negative hematology ROS (+)   Anesthesia Other Findings Past Medical History: No date: 3-vessel CAD No date: Allergy No date: Anemia No date: Anxiety No date: Arthritis No date: COPD (chronic obstructive pulmonary disease) (HCC) No date: Depression No date: GERD (gastroesophageal reflux disease) 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 No date: Hypertension No date: 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 No date: Restless leg syndrome No date: Sleep apnea 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, lumbar region with neurogenic claudication No date: Vertigo No date: Wears dentures     Comment:  full upper and lower  Past Surgical History: 08/16/2023: BIOPSY     Comment:  Procedure: BIOPSY;  Surgeon: Luke Salaam, MD;  Location:              [facility] ENDOSCOPY;  Service: Gastroenterology;; No date: CESAREAN SECTION 08/16/2023: ESOPHAGOGASTRODUODENOSCOPY (EGD) WITH PROPOFOL ; N/A     Comment:  Procedure: ESOPHAGOGASTRODUODENOSCOPY (EGD) WITH               PROPOFOL ;  Surgeon: Luke Salaam, MD;  Location: [facility]                ENDOSCOPY;  Service: Gastroenterology;  Laterality: N/A; No date: FRACTURE SURGERY No date: GASTRIC BYPASS 03/30/2023: HARDWARE REMOVAL; Left     Comment:  Procedure: Left distal radius DVR plate, hardware,               removal;  Surgeon: Molli Angelucci, MD;  Location: [facility]               [facility];  Service: Orthopedics;  Laterality: Left; 12/29/2022: OPEN REDUCTION INTERNAL FIXATION (ORIF) DISTAL RADIAL  FRACTURE; Left     Comment:  Procedure: OPEN REDUCTION INTERNAL FIXATION (ORIF)               DISTAL RADIUS FRACTURE;  Surgeon: Molli Angelucci, MD;                Location: [facility];  Service: Orthopedics;                Laterality: Left; No date: TONSILLECTOMY No date: TUBAL LIGATION  BMI    Body Mass Index: 32.80 kg/m      Reproductive/Obstetrics negative OB ROS                              Anesthesia Physical Anesthesia Plan  ASA: 3  Anesthesia Plan: General ETT   Post-op Pain Management: Toradol IV (intra-op)* and Ofirmev  IV (intra-op)*  Induction: Intravenous  PONV Risk Score and Plan: 2 and Ondansetron , Dexamethasone  and Midazolam   Airway Management Planned: Oral ETT  Additional Equipment:   Intra-op Plan:   Post-operative Plan: Extubation in OR  Informed Consent: I have reviewed the patients History and Physical, chart, labs and discussed the procedure including the risks, benefits and alternatives for the proposed anesthesia with the patient or authorized representative who has indicated his/her understanding and acceptance.     Dental Advisory Given  Plan Discussed with: CRNA and Surgeon  Anesthesia Plan Comments:         Anesthesia Quick Evaluation

## 2024-02-08 NOTE — Interval H&P Note (Signed)
 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  The various methods of treatment have been discussed with the patient and family. After consideration of risks, benefits and other options for treatment, the patient has consented to  Procedure(s) with comments: LUMBAR LAMINECTOMY/DECOMPRESSION MICRODISCECTOMY 2 LEVELS (Right) - MIS L2-3 HEMILAMINECTOMY L4-5 DISCECTOMY as a surgical intervention.  The patient's history has been reviewed, patient examined, no change in status, stable for surgery.  I have reviewed the patient's chart and labs.  Questions were answered to the patient's satisfaction.    Heart and Lungs Are Clear   Carroll Clamp

## 2024-02-08 NOTE — Discharge Instructions (Signed)
 Your surgeon has performed an operation on your lumbar spine (low back) to relieve pressure on one or more nerves. Many times, patients feel better immediately after surgery and can "overdo it." Even if you feel well, it is important that you follow these activity guidelines. If you do not let your back heal properly from the surgery, you can increase the chance of hardware complications and/or return of your symptoms. The following are instructions to help in your recovery once you have been discharged from the hospital.   Activity    No bending, lifting, or twisting ("BLT"). Avoid lifting objects heavier than 10 pounds (gallon milk jug).  Where possible, avoid household activities that involve lifting, bending, pushing, or pulling such as laundry, vacuuming, grocery shopping, and childcare. Try to arrange for help from friends and family for these activities while your back heals.  Increase physical activity slowly as tolerated.  Taking short walks is encouraged, but avoid strenuous exercise. Do not jog, run, bicycle, lift weights, or participate in any other exercises unless specifically allowed by your doctor. Avoid prolonged sitting, including car rides.  Talk to your doctor before resuming sexual activity.  You should not drive until cleared by your doctor.  Until released by your doctor, you should not return to work or school.  You should rest at home and let your body heal.   You may shower three days after your surgery.  After showering, lightly dab your incision dry. Do not take a tub bath or go swimming for 3 weeks, or until approved by your doctor at your follow-up appointment.  If you smoke, we strongly recommend that you quit.  Smoking has been proven to interfere with normal healing in your back and will dramatically reduce the success rate of your surgery. Please contact [facility] (800-QUIT-NOW) and use the resources at www.[facility].com for assistance in stopping  smoking.  Surgical Incision   If you have a dressing on your incision, you may remove it three days after your surgery. Keep your incision area clean and dry.  Your incision was closed with Dermabond glue. The glue should begin to peel away within about a week.  Diet            You may return to your usual diet. Be sure to stay hydrated.  You have been prescribed narcotic pain medications.  This often will cause constipation along with the anesthesia that you underwent.  Please obtain Colace and senna. This has been sent to your local pharmacy, but at times it is not covered by insurance and you may obtain it over the counter..  You should take a stool softener and laxative for the duration of you taking the narcotic pain medications.  When to Contact Us   Although your surgery and recovery will likely be uneventful, you may have some residual numbness, aches, and pains in your back and/or legs. This is normal and should improve in the next few weeks.  However, should you experience any of the following, contact us  immediately: New numbness or weakness Pain that is progressively getting worse, and is not relieved by your pain medications or rest Bleeding, redness, swelling, pain, or drainage from surgical incision Chills or flu-like symptoms Fever greater than 101.0 F (38.3 C) Problems with bowel or bladder functions Difficulty breathing or shortness of breath Warmth, tenderness, or swelling in your calf  Contact Information How to contact us :  If you have any questions/concerns before or after surgery, you can reach us  at [phone],  or you can send a [employer]. We can be reached by phone or mychart 8am-4pm, Monday-Friday.  *Please note: Calls after 4pm are forwarded to a third party answering service. Mychart messages are not routinely monitored during evenings, weekends, and holidays. Please call our office to contact the answering service for urgent concerns during  non-business hours.

## 2024-02-08 NOTE — Anesthesia Postprocedure Evaluation (Signed)
 Anesthesia Post Note  Patient: RANEE PEASLEY  Procedure(s) Performed: LUMBAR LAMINECTOMY/DECOMPRESSION MICRODISCECTOMY 2 LEVELS (Right: Spine Lumbar)  Patient location during evaluation: PACU Anesthesia Type: General Level of consciousness: awake and alert Pain management: pain level controlled Vital Signs Assessment: post-procedure vital signs reviewed and stable Respiratory status: spontaneous breathing, nonlabored ventilation and respiratory function stable Cardiovascular status: blood pressure returned to baseline and stable Postop Assessment: no apparent nausea or vomiting Anesthetic complications: no   No notable events documented.   Last Vitals:  Vitals:   02/08/24 0930 02/08/24 0953  BP: (!) 116/54 130/71  Pulse: 81 75  Resp: 16 18  Temp:  37.2 C  SpO2: 92% 95%    Last Pain:  Vitals:   02/08/24 0953  TempSrc: Temporal  PainSc: 0-No pain                 Baltazar Bonier

## 2024-02-08 NOTE — Anesthesia Procedure Notes (Signed)
 Procedure Name: Intubation Date/Time: 02/08/2024 7:23 AM  Performed by: Noelia Batman, CRNAPre-anesthesia Checklist: Patient identified, Emergency Drugs available, Suction available and Patient being monitored Patient Re-evaluated:Patient Re-evaluated prior to induction Oxygen Delivery Method: Circle System Utilized Preoxygenation: Pre-oxygenation with 100% oxygen Induction Type: IV induction Ventilation: Mask ventilation without difficulty Laryngoscope Size: McGrath and 3 Grade View: Grade I Tube type: Oral Tube size: 7.0 mm Number of attempts: 1 Airway Equipment and Method: Stylet and Oral airway Placement Confirmation: ETT inserted through vocal cords under direct vision, positive ETCO2 and breath sounds checked- equal and bilateral Secured at: 21 cm Tube secured with: Tape Dental Injury: Teeth and Oropharynx as per pre-operative assessment

## 2024-02-08 NOTE — Transfer of Care (Signed)
 Immediate Anesthesia Transfer of Care Note  Patient: Cassandra Brandt  Procedure(s) Performed: LUMBAR LAMINECTOMY/DECOMPRESSION MICRODISCECTOMY 2 LEVELS (Right: Spine Lumbar)  Patient Location: PACU  Anesthesia Type:General  Level of Consciousness: drowsy and patient cooperative  Airway & Oxygen Therapy: Patient Spontanous Breathing and Patient connected to face mask oxygen  Post-op Assessment: Report given to RN, Post -op Vital signs reviewed and stable, and Patient moving all extremities X 4  Post vital signs: Reviewed and stable  Last Vitals:  Vitals Value Taken Time  BP 113/64 02/08/24 0902  Temp    Pulse 104 02/08/24 0905  Resp 16 02/08/24 0905  SpO2 95 % 02/08/24 0905  Vitals shown include unfiled device data.  Last Pain:  Vitals:   02/08/24 0613  TempSrc: Temporal  PainSc: 5          Complications: No notable events documented.

## 2024-02-08 NOTE — Op Note (Signed)
 Indications: [name] is suffering from lumbar radiculopathy. The patient tried and failed conservative management, prompting surgical intervention.  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   Preoperative Note:   Risks of surgery discussed include: infection, bleeding, stroke, coma, death, paralysis, CSF leak, nerve/spinal cord injury, numbness, tingling, weakness, complex regional pain syndrome, recurrent stenosis and/or disc herniation, vascular injury, development of instability, neck/back pain, need for further surgery, persistent symptoms, development of deformity, and the risks of anesthesia. The patient understood these risks and agreed to proceed.  Operative Note:   1) right L 2/3 microdiscectomy 2) right L4-5 hemilaminectomy/decompression  The patient was then brought from the preoperative center with intravenous access established.  The patient underwent general anesthesia and endotracheal tube intubation, and was then rotated on the Casselman rail top where all pressure points were appropriately padded.  The skin was then thoroughly cleansed.  Perioperative antibiotic prophylaxis was administered.  Sterile prep and drapes were then applied and a timeout was then observed.  C-arm was brought into the field under sterile conditions, and the L 2 3 disc space identified and marked with an incision on the right 1cm lateral to midline.  Once this was complete a 2 cm incision was opened with the use of a #10 blade knife.  The Metrx  tubes were sequentially advanced under lateral fluoroscopy until a 18 x 60 mm Metrx tube was placed over the facet and lamina and secured to the bed.    The microscope was then sterilely brought into the field and muscle creep was hemostased with a bipolar and resected with a pituitary rongeur.  A Bovie extender was then used to expose the spinous process and lamina.  Careful attention was placed to not violate the facet capsule. A 3 mm matchstick drill bit was then used to make a hemi-laminotomy trough until the ligamentum flavum was exposed.  This was extended to the base of the spinous process.  Once this was complete and the underlying ligamentum flavum was visualized, the ligamentum was dissected with an up angle curette and resected with a #2 and #3 mm biting Kerrison.  The laminotomy opening was also expanded in similar fashion and hemostasis was obtained with Surgifoam and a patty as well as bone wax.  The rostral aspect of the caudal level of the lamina was also resected with a #2 biting Kerrison effort to further enhance exposure.  Once the underlying dura was visualized a Penfield 4 was then used to dissect and expose the traversing nerve root.  Once this was identified a nerve root retractor suction was used to mobilize this medially.  The venous plexus was hemostased with Surgifoam and light bipolar use.  A small penfied was then used to make a small annulotomy within the disc space and disc space contents were noted to come through the annulus.    A small disc herniation was identified and dissected free using a balltip probe. The pituitary rongeur was used to remove the extruded disc fragments. Once the thecal sac and nerve root were noted to be relaxed and under less tension the ball-tipped feeler was passed  along the foramen distally to ensure no residual compression was noted.    Depo-Medrol was placed along the nerve root.  The area was irrigated. The tube system was then removed under  microscopic visualization and hemostasis was obtained with a bipolar.    We then turned our attention to the right sided L4-5 level.  Again with fluoroscopic guidance we placed the tube at the correct level.  We performed a hemilaminectomy/medial facetectomy under microscopic guidance.  We followed this cranially until the ligamentum was no longer inserting on the lamina.  We filed it caudally until we are able to feel the superior portion of the L5 lamina.  Once we had the ligamentum exposed we used an upgoing curette to take down the ligamentum.  We are able to see epidural fat.  We then introduced a Kerrison rongeur to make a medial cut and the ligamentum.  We then lateralized on both the superior and inferior portion.  We are able to see the lateral aspect of the dura.  We continue to decompress this.  There was some medial facet overgrowth in the lateral recess so this was decompressed as well.  We felt ventrally and did not feel any evidence of disc herniation at this level, the posterior compression however was quite severe and the nerve root was well decompressed after the dorsal decompression.  The wound was irrigated.  Depo-Medrol was placed.  Hemostasis was meticulous.  The fascial layer was reapproximated with the use of a 0- Vicryl suture.  Subcutaneous tissue layer was reapproximated using 2-0 Vicryl suture.  3-0 monocryl was used on the skin. The skin was then cleansed and Dermabond was used to close the skin opening.  Patient was then rotated back to the preoperative bed awakened from anesthesia and taken to recovery all counts are correct in this case.   Performed this procedure without an Designer, television/film set.  Carroll Clamp, MD [facility] Neurosurgery

## 2024-02-09 ENCOUNTER — Encounter: Payer: Self-pay | Admitting: Neurosurgery

## 2024-02-22 ENCOUNTER — Encounter: Payer: Self-pay | Admitting: Physician Assistant

## 2024-02-22 ENCOUNTER — Ambulatory Visit (INDEPENDENT_AMBULATORY_CARE_PROVIDER_SITE_OTHER): Admitting: Physician Assistant

## 2024-02-22 VITALS — BP 124/74 | Temp 98.5°F | Ht 64.0 in | Wt 191.0 lb

## 2024-02-22 DIAGNOSIS — M48062 Spinal stenosis, lumbar region with neurogenic claudication: Secondary | ICD-10-CM

## 2024-02-22 DIAGNOSIS — Z9889 Other specified postprocedural states: Secondary | ICD-10-CM

## 2024-02-22 MED ORDER — CYCLOBENZAPRINE HCL 10 MG PO TABS: 10.0000 mg | ORAL_TABLET | Freq: Three times a day (TID) | ORAL | 0 refills | Status: DC | PRN

## 2024-02-22 NOTE — Progress Notes (Unsigned)
   REFERRING PHYSICIAN:  Melvin Pao, [address] [place],  [place] [address]  DOS: 02/08/2024 MIS L2-3 hemilaminectomy, L4-5 discectomy  HISTORY OF PRESENT ILLNESS: Cassandra Brandt is approximately 2 weeks status post MIS L2-3 hemilaminectomy, L4-5 discectomy. she is doing well.  Her pain has decreased significantly, but she still has numbness and tingling present as expected.  However she is pleased with her progress so far.   PHYSICAL EXAMINATION:  General: Patient is well developed, well nourished, calm, collected, and in no apparent distress.   NEUROLOGICAL:  General: In no acute distress.   Awake, alert, oriented to person, place, and time.  Pupils equal round and reactive to light.  Facial tone is symmetric.     Strength:            Side Iliopsoas Quads Hamstring PF DF EHL  R 5 5 5 5 5 5   L 5 5 5 5 5 5    Incision c/d/i   ROS (Neurologic):  Negative except as noted above  IMAGING: No new imaging prior to this appointment.  ASSESSMENT/PLAN:  Cassandra Brandt is doing well approximately 2 weeks after MIS L2-3 hemilaminectomy and L4-5 discectomy. she will follow up in approximately 1 month for her 6-week postop visit.  Wound care and activity escalation was discussed.I have advised the patient to lift up to 10 pounds until 6 weeks after surgery, then increase up to 25 pounds until 12 weeks after surgery.  After 12 weeks post-op, the patient advised to increase activity as tolerated.  Flexeril  refill was prescribed.  Advised to contact the office if any questions or concerns arise.  Lyle Decamp PA-C Department of neurosurgery

## 2024-02-23 ENCOUNTER — Encounter: Payer: Self-pay | Admitting: Nurse Practitioner

## 2024-02-23 ENCOUNTER — Telehealth (INDEPENDENT_AMBULATORY_CARE_PROVIDER_SITE_OTHER): Admitting: Nurse Practitioner

## 2024-02-23 VITALS — BP 121/74

## 2024-02-23 DIAGNOSIS — M48062 Spinal stenosis, lumbar region with neurogenic claudication: Secondary | ICD-10-CM | POA: Diagnosis not present

## 2024-02-23 DIAGNOSIS — F339 Major depressive disorder, recurrent, unspecified: Secondary | ICD-10-CM | POA: Diagnosis not present

## 2024-02-23 MED ORDER — PREGABALIN 25 MG PO CAPS
25.0000 mg | ORAL_CAPSULE | Freq: Two times a day (BID) | ORAL | 0 refills | Status: DC
Start: 2024-02-23 — End: 2024-03-07

## 2024-02-23 MED ORDER — DULOXETINE HCL 20 MG PO CPEP: 20.0000 mg | ORAL_CAPSULE | Freq: Every day | ORAL | 0 refills | Status: DC

## 2024-02-23 NOTE — Progress Notes (Signed)
 BP 121/74    Subjective:    Patient ID: Cassandra Brandt, female    DOB: [birth year], 68 y.o.   MRN: [id number]  HPI: Cassandra Brandt is a 69 y.o. female  Chief Complaint  Patient presents with   Depression   DEPRESSION Patient states she is very down.  She had surgery recently and the nerve pain is not improved after the surgery.  She feels like she is stressed and depressed and the pain isn't improving.  She wakes up in the night with her leg tingling.  No one came to help her after the surgery.  No one called to check on her.  She was on Gabapentin which didn't help with her symptoms.  She hasn't taken pregabalin before.    Relevant past medical, surgical, family and social history reviewed and updated as indicated. Interim medical history since our last visit reviewed. Allergies and medications reviewed and updated.  Review of Systems  Musculoskeletal:  Positive for back pain.  Psychiatric/Behavioral:  Positive for dysphoric mood. Negative for suicidal ideas.     Per HPI unless specifically indicated above     Objective:    BP 121/74   Wt Readings from Last 3 Encounters:  02/22/24 191 lb (86.6 kg)  02/08/24 191 lb 1.6 oz (86.7 kg)  01/27/24 191 lb 1.6 oz (86.7 kg)    Physical Exam Vitals and nursing note reviewed.  Constitutional:      General: She is not in acute distress.    Appearance: She is not ill-appearing.  HENT:     Head: Normocephalic.     Right Ear: Hearing normal.     Left Ear: Hearing normal.     Nose: Nose normal.  Pulmonary:     Effort: Pulmonary effort is normal. No respiratory distress.   Neurological:     Mental Status: She is alert.   Psychiatric:        Mood and Affect: Mood normal.        Behavior: Behavior normal.        Thought Content: Thought content normal.        Judgment: Judgment normal.     Results for orders placed or performed during the hospital encounter of 02/08/24  ABO/Rh   Collection Time: 02/08/24  6:32 AM  Result Value  Ref Range   ABO/RH(D)      O POS Performed at [facility], [address] [place], [place] [address]       Assessment & Plan:   Problem List Items Addressed This Visit       Other   Depression, recurrent (HCC)   Chronic. Not well controlled.  Will increase Duloxetine  from 60mg  to 80mg  daily.  Follow up in 2 weeks.  Call sooner if concerns arise.       Relevant Medications   DULoxetine  (CYMBALTA ) 20 MG capsule   Spinal stenosis, lumbar region, with neurogenic claudication - Primary   Chronic. Not well controlled.  Will start Pregabalin.  Did not see improvement with Gabapentin.  Side effects and benefits of medication discussed.  Follow up in 2 weeks. Call sooner if concerns arise.         Follow up plan: Return in about 2 weeks (around 03/08/2024) for Depression/Anxiety FU.  This visit was completed via MyChart due to the restrictions of the COVID-19 pandemic. All issues as above were discussed and addressed. Physical exam was done as above through visual confirmation on MyChart. If it was felt  that the patient should be evaluated in the office, they were directed there. The patient verbally consented to this visit. Location of the patient: Home Location of the provider: Office Those involved with this call:  Provider: Darice Petty, NP CMA: NA Front Desk/Registration: Claretta Maiden This encounter was conducted via video.  I spent 30 minutes dedicated to the care of this patient on the date of this encounter to include previsit review of symptoms, plan of care and follow up, face to face time with the patient, and post visit ordering of testing.

## 2024-02-23 NOTE — Assessment & Plan Note (Signed)
 Chronic. Not well controlled.  Will increase Duloxetine  from 60mg  to 80mg  daily.  Follow up in 2 weeks.  Call sooner if concerns arise.

## 2024-02-23 NOTE — Assessment & Plan Note (Signed)
 Chronic. Not well controlled.  Will start Pregabalin.  Did not see improvement with Gabapentin.  Side effects and benefits of medication discussed.  Follow up in 2 weeks. Call sooner if concerns arise.

## 2024-02-24 ENCOUNTER — Encounter: Payer: Self-pay | Admitting: Nurse Practitioner

## 2024-02-28 ENCOUNTER — Other Ambulatory Visit: Payer: Self-pay | Admitting: Physician Assistant

## 2024-02-28 MED ORDER — TIZANIDINE HCL 4 MG PO TABS: 4.0000 mg | ORAL_TABLET | Freq: Three times a day (TID) | ORAL | 1 refills | Status: DC | PRN

## 2024-02-28 NOTE — Telephone Encounter (Signed)
 She can't take tylenol  # 3 b/c she is allergic to codeine. She has tried baclofen in the past and it didn't help. Methocarbamol  made her vomit. I asked her about Tizanidine/Zanaflex and she doesn't recall ever taking this one, but she is willing to try it. I did inform her that it can make her drowsy. She said she appreciates us !  [place] in [place]

## 2024-03-07 ENCOUNTER — Telehealth: Admitting: Nurse Practitioner

## 2024-03-07 ENCOUNTER — Other Ambulatory Visit: Payer: Self-pay | Admitting: Nurse Practitioner

## 2024-03-07 ENCOUNTER — Encounter: Payer: Self-pay | Admitting: Nurse Practitioner

## 2024-03-07 VITALS — Wt 188.0 lb

## 2024-03-07 DIAGNOSIS — M5416 Radiculopathy, lumbar region: Secondary | ICD-10-CM

## 2024-03-07 DIAGNOSIS — F339 Major depressive disorder, recurrent, unspecified: Secondary | ICD-10-CM | POA: Diagnosis not present

## 2024-03-07 MED ORDER — PREGABALIN 50 MG PO CAPS: 50.0000 mg | ORAL_CAPSULE | Freq: Two times a day (BID) | ORAL | 0 refills | Status: DC

## 2024-03-07 NOTE — Progress Notes (Signed)
 Wt 188 lb (85.3 kg)   BMI 32.27 kg/m    Subjective:    Patient ID: Cassandra Brandt, female    DOB: Apr 18, 1956, 68 y.o.   MRN: [id number]  HPI: Cassandra Brandt is a 68 y.o. female  Chief Complaint  Patient presents with   Depression   DEPRESSION Patient states she is doing better.  She feels like the increase in duloxetine  has really helped her symptoms.  She is also taking the Pregabalin  and has seen a 40% improvement in the pain and tingling in her legs.  She likes the dose of the Duloxetine  and feels like it is a good dose for her.    Relevant past medical, surgical, family and social history reviewed and updated as indicated. Interim medical history since our last visit reviewed. Allergies and medications reviewed and updated.  Review of Systems  Musculoskeletal:  Positive for back pain.  Psychiatric/Behavioral:  Positive for dysphoric mood. Negative for suicidal ideas.     Per HPI unless specifically indicated above     Objective:    Wt 188 lb (85.3 kg)   BMI 32.27 kg/m   Wt Readings from Last 3 Encounters:  03/07/24 188 lb (85.3 kg)  02/22/24 191 lb (86.6 kg)  02/08/24 191 lb 1.6 oz (86.7 kg)    Physical Exam Vitals and nursing note reviewed.  Constitutional:      General: She is not in acute distress.    Appearance: She is not ill-appearing.  HENT:     Head: Normocephalic.     Right Ear: Hearing normal.     Left Ear: Hearing normal.     Nose: Nose normal.  Pulmonary:     Effort: Pulmonary effort is normal. No respiratory distress.  Neurological:     Mental Status: She is alert.  Psychiatric:        Mood and Affect: Mood normal.        Behavior: Behavior normal.        Thought Content: Thought content normal.        Judgment: Judgment normal.     Results for orders placed or performed during the hospital encounter of 02/08/24  ABO/Rh   Collection Time: 02/08/24  6:32 AM  Result Value Ref Range   ABO/RH(D)      O POS Performed at [facility], [address], [place], [place] [address]       Assessment & Plan:   Problem List Items Addressed This Visit       Nervous and Auditory   Lumbar radiculopathy, right   Improved with Pregabalin .  Will increase dose to 50mg  BID.  Follow up in 2 months. Call sooner if concerns arise.       Relevant Medications   pregabalin  (LYRICA ) 50 MG capsule     Other   Depression, recurrent (HCC) - Primary   Chronic.  Controlled.  Continue with current medication regimen of Duloxetine  80mg .  Return to clinic in 2 months for reevaluation.  Call sooner if concerns arise.           Follow up plan: Return in about 2 months (around 05/08/2024) for Depression/Anxiety FU.  This visit was completed via MyChart due to the restrictions of the COVID-19 pandemic. All issues as above were discussed and addressed. Physical exam was done as above through visual confirmation on MyChart. If it was felt that the patient should be evaluated in the office, they were directed there. The patient verbally consented  to this visit. Location of the patient: Home Location of the provider: Office Those involved with this call:  Provider: Darice Petty, NP CMA: Gareth Fogo, CMA Front Desk/Registration: Claretta Maiden This encounter was conducted via video.  I spent 30 minutes dedicated to the care of this patient on the date of this encounter to include previsit review of symptoms, plan of care and follow up, face to face time with the patient, and post visit ordering of testing.

## 2024-03-07 NOTE — Assessment & Plan Note (Signed)
 Chronic.  Controlled.  Continue with current medication regimen of Duloxetine  80mg .  Return to clinic in 2 months for reevaluation.  Call sooner if concerns arise.

## 2024-03-07 NOTE — Assessment & Plan Note (Signed)
 Improved with Pregabalin .  Will increase dose to 50mg  BID.  Follow up in 2 months. Call sooner if concerns arise.

## 2024-03-08 ENCOUNTER — Encounter: Payer: Self-pay | Admitting: Nurse Practitioner

## 2024-03-08 DIAGNOSIS — M25531 Pain in right wrist: Secondary | ICD-10-CM

## 2024-03-08 DIAGNOSIS — J449 Chronic obstructive pulmonary disease, unspecified: Secondary | ICD-10-CM

## 2024-03-09 NOTE — Telephone Encounter (Signed)
 Requested medications are due for refill today.  No - too soon  Requested medications are on the active medications list.  yes  Last refill. 01/25/2024 #30 2 rf  Future visit scheduled.   yes  Notes to clinic.  Refill not delegated.    Requested Prescriptions  Pending Prescriptions Disp Refills   temazepam  (RESTORIL ) 30 MG capsule [Pharmacy Med Name: TEMAZEPAM  30 MG CAPSULE] 30 capsule 0    Sig: Take 1 capsule (30 mg total) by mouth at bedtime.     Not Delegated - Psychiatry: Anxiolytics/Hypnotics 2 Failed - 03/09/2024  4:12 PM      Failed - This refill cannot be delegated      Passed - Urine Drug Screen completed in last 360 days      Passed - Patient is not pregnant      Passed - Valid encounter within last 6 months    Recent Outpatient Visits           2 days ago Depression, recurrent (HCC)   [place] [facility] Melvin Pao, NP   2 weeks ago Spinal stenosis, lumbar region, with neurogenic claudication   [place] [facility] Melvin Pao, NP   1 month ago Chronic obstructive pulmonary disease, unspecified COPD type [facility])   [place] [facility] Melvin Pao, NP   4 months ago Encounter for screening mammogram for malignant neoplasm of breast    [place] [facility] Melvin Pao, NP

## 2024-03-20 ENCOUNTER — Encounter: Payer: Self-pay | Admitting: Neurosurgery

## 2024-03-20 ENCOUNTER — Ambulatory Visit (INDEPENDENT_AMBULATORY_CARE_PROVIDER_SITE_OTHER): Admitting: Neurosurgery

## 2024-03-20 VITALS — BP 114/80 | Temp 98.5°F | Ht 64.0 in | Wt 188.0 lb

## 2024-03-20 DIAGNOSIS — M48062 Spinal stenosis, lumbar region with neurogenic claudication: Secondary | ICD-10-CM

## 2024-03-20 DIAGNOSIS — M25551 Pain in right hip: Secondary | ICD-10-CM | POA: Insufficient documentation

## 2024-03-20 DIAGNOSIS — Z9889 Other specified postprocedural states: Secondary | ICD-10-CM | POA: Insufficient documentation

## 2024-03-20 MED ORDER — ALBUTEROL SULFATE HFA 108 (90 BASE) MCG/ACT IN AERS: 2.0000 | INHALATION_SPRAY | Freq: Four times a day (QID) | RESPIRATORY_TRACT | 1 refills | Status: AC | PRN

## 2024-03-20 NOTE — Progress Notes (Signed)
   REFERRING PHYSICIAN:  Melvin Pao, Np [address] [place],  [place] [address]  DOS: 02/08/2024 MIS L2-3 hemilaminectomy, L4-5 discectomy  HISTORY OF PRESENT ILLNESS: Cassandra Brandt is approximately 6 weeks status post MIS L2-3 hemilaminectomy, L4-5 discectomy. she is doing well.  Her back pain has improved significantly.  She has had some improvement in her distal right lower extremity symptoms, however her more proximal symptoms including pain in her hip have continued to be present.  PHYSICAL EXAMINATION:  General: Patient is well developed, well nourished, calm, collected, and in no apparent distress.   NEUROLOGICAL:  General: In no acute distress.   Awake, alert, oriented to person, place, and time.  Pupils equal round and reactive to light.  Facial tone is symmetric.     Strength:            Side Iliopsoas Quads Hamstring PF DF EHL  R 5 5 5 5 5 5   L 5 5 5 5 5 5    Incision c/d/i  She has significant pain with internal/external rotation of her right hip.  She also has pain to palpation in the area of her trochanteric bursa and IT band.  ROS (Neurologic):  Negative except as noted above  IMAGING: No new imaging prior to this appointment.  ASSESSMENT/PLAN:  Cassandra Brandt is doing well approximately 6 weeks after MIS L2-3 hemilaminectomy and L4-5 discectomy.  She does continue to have pain in her right hip.  This likely secondary to her scoliosis and pelvic tilt.  It is positive and provoked by internal/external rotation of the right hip and with palpation to the region of the trochanteric bursa.  I would like to have her evaluated by her pain and orthopedics team for any local treatments.  From a neurologic standpoint she has had an improvement in some of her more distal symptoms, her back pain is also better.  She does continue to have a spinal deformity, I let her know that if her hip pain was in fact neurologic that she would need a more extensive intervention such as  interbody fusions for indirect decompression, at this time I do not think that majority of her issues are coming from a nerve root level and will see if she can get any relief with hip focused interventions.  Will consider scoliosis x-rays at her next visit if she continues to have symptoms.  Advised to contact the office if any questions or concerns arise.  Cassandra MICAEL Sharps, MD Department of neurosurgery

## 2024-03-20 NOTE — Progress Notes (Deleted)
 Scoli xrays at next rfollow up    Trochanteric bursitis?   PROM hip pain

## 2024-03-23 DIAGNOSIS — Z9889 Other specified postprocedural states: Secondary | ICD-10-CM | POA: Diagnosis not present

## 2024-03-23 DIAGNOSIS — Z8781 Personal history of (healed) traumatic fracture: Secondary | ICD-10-CM | POA: Diagnosis not present

## 2024-03-23 DIAGNOSIS — M1611 Unilateral primary osteoarthritis, right hip: Secondary | ICD-10-CM | POA: Diagnosis not present

## 2024-03-23 DIAGNOSIS — M7061 Trochanteric bursitis, right hip: Secondary | ICD-10-CM | POA: Diagnosis not present

## 2024-03-23 DIAGNOSIS — M654 Radial styloid tenosynovitis [de Quervain]: Secondary | ICD-10-CM | POA: Diagnosis not present

## 2024-03-23 DIAGNOSIS — S52532D Colles' fracture of left radius, subsequent encounter for closed fracture with routine healing: Secondary | ICD-10-CM | POA: Diagnosis not present

## 2024-03-29 ENCOUNTER — Encounter: Payer: Self-pay | Admitting: Neurosurgery

## 2024-03-29 DIAGNOSIS — M48062 Spinal stenosis, lumbar region with neurogenic claudication: Secondary | ICD-10-CM

## 2024-03-30 ENCOUNTER — Telehealth: Admitting: Family Medicine

## 2024-03-30 ENCOUNTER — Encounter: Payer: Self-pay | Admitting: Family Medicine

## 2024-03-30 ENCOUNTER — Ambulatory Visit (INDEPENDENT_AMBULATORY_CARE_PROVIDER_SITE_OTHER)

## 2024-03-30 ENCOUNTER — Ambulatory Visit
Admission: EM | Admit: 2024-03-30 | Discharge: 2024-03-30 | Disposition: A | Discharge status: Home / Self Care | Attending: Nurse Practitioner | Admitting: Nurse Practitioner

## 2024-03-30 DIAGNOSIS — R051 Acute cough: Secondary | ICD-10-CM | POA: Diagnosis not present

## 2024-03-30 DIAGNOSIS — R059 Cough, unspecified: Secondary | ICD-10-CM | POA: Diagnosis not present

## 2024-03-30 DIAGNOSIS — J441 Chronic obstructive pulmonary disease with (acute) exacerbation: Secondary | ICD-10-CM | POA: Diagnosis not present

## 2024-03-30 DIAGNOSIS — J069 Acute upper respiratory infection, unspecified: Secondary | ICD-10-CM | POA: Diagnosis not present

## 2024-03-30 DIAGNOSIS — R5383 Other fatigue: Secondary | ICD-10-CM

## 2024-03-30 LAB — POC SOFIA SARS ANTIGEN FIA: SARS Coronavirus 2 Ag: NEGATIVE

## 2024-03-30 MED ORDER — MUCINEX DM MAXIMUM STRENGTH 60-1200 MG PO TB12: 1.0000 | ORAL_TABLET | Freq: Two times a day (BID) | ORAL | 0 refills | Status: DC

## 2024-03-30 MED ORDER — DEXAMETHASONE SODIUM PHOSPHATE 10 MG/ML IJ SOLN
10.0000 mg | Freq: Once | INTRAMUSCULAR | Status: AC
  Administered 2024-03-30: 10 mg via INTRAMUSCULAR

## 2024-03-30 MED ORDER — PSEUDOEPH-BROMPHEN-DM 30-2-10 MG/5ML PO SYRP: 10.0000 mL | ORAL_SOLUTION | Freq: Four times a day (QID) | ORAL | 0 refills | Status: DC | PRN

## 2024-03-30 MED ORDER — PREDNISONE 20 MG PO TABS: 40.0000 mg | ORAL_TABLET | Freq: Every day | ORAL | 0 refills | Status: AC

## 2024-03-30 MED ORDER — IPRATROPIUM-ALBUTEROL 0.5-2.5 (3) MG/3ML IN SOLN
3.0000 mL | Freq: Once | RESPIRATORY_TRACT | Status: AC
  Administered 2024-03-30: 3 mL via RESPIRATORY_TRACT

## 2024-03-30 MED ORDER — AZITHROMYCIN 500 MG PO TABS
500.0000 mg | ORAL_TABLET | Freq: Every day | ORAL | 0 refills | Status: AC
Start: 2024-03-30 — End: 2024-04-04

## 2024-03-30 NOTE — ED Provider Notes (Signed)
 Cassandra Brandt    CSN: [id number] Arrival date & time: 03/30/24  1531      History   Chief Complaint Chief Complaint  Patient presents with   Cough   Fatigue    HPI Cassandra Brandt is a 68 y.o. female.   Discussed the use of AI scribe software for clinical note transcription with the patient, who gave verbal consent to proceed.   The patient, with a history of COPD and continued tobacco abuse (1 ppd), presents with a wet cough, trouble breathing, fatigue, and episodes of sweating. The patient reports that the cough started earlier in the week, accompanied by a runny nose and postnasal drainage. Yesterday, while at her part-time job, she suddenly experienced profuse sweating, weakness, and nausea, prompting her to go home and rest. This morning, she felt better but still had the cough. During lunch with her nephew, she experienced another episode of sweating.  The patient describes the cough as wet and productive, causing some chest tightness. She has been using her albuterol  inhaler and breztri  to manage symptoms. The cough has led to a sore throat, which the patient attributes to the frequent coughing. She has also been using Mucinex  to help break up the cough. The patient denies fever, chills, body aches, wheezing, vomiting, or diarrhea. She mentions that the last time she experienced similar symptoms, she was diagnosed with pneumonia.  She works part-time in [employer] and is unsure if she has been exposed to anyone who has been sick in the past two weeks. She denies any chest pain or palpitations, and report no swelling in her feet, legs, or ankles.  The following portions of the patient's history were reviewed and updated as appropriate: allergies, current medications, past family history, past medical history, past social history, past surgical history, and problem list.      Past Medical History:  Diagnosis Date   3-vessel CAD    Allergy    Anemia     Anxiety    Arthritis    COPD (chronic obstructive pulmonary disease) (HCC)    Depression    GERD (gastroesophageal reflux disease)    History of Roux-en-Y gastric bypass    Hypertension    Neuromuscular disorder (HCC)    Nerve disease in spine   Restless leg syndrome    Sleep apnea    Spinal stenosis, lumbar region with neurogenic claudication    Vertigo    Wears dentures    full upper and lower    Patient Active Problem List   Diagnosis Date Noted   Post-operative state 03/20/2024   Right hip pain 03/20/2024   Intervertebral lumbar disc disorder with myelopathy, lumbar region 02/08/2024   Spinal stenosis, lumbar region, with neurogenic claudication 01/19/2024   Lumbar radiculopathy, right 01/19/2024   Ventral hernia 11/15/2023   Family history of colon cancer 11/15/2023   Depression, recurrent (HCC) 09/23/2023   Other dysphagia 08/16/2023   Advanced care planning/counseling discussion 11/11/2022   Iron deficiency anemia 04/28/2021   Anxiety 01/30/2021   Chronic obstructive pulmonary disease, unspecified (HCC) 01/30/2021   Essential hypertension 01/30/2021   Vitamin D  deficiency 01/30/2021   Tobacco user 01/30/2021   Osteoarthritis of hip 01/30/2021   Gastroesophageal reflux disease 01/21/2021   H/O gastric bypass 01/21/2021   Primary insomnia 01/21/2021   RLS (restless legs syndrome) 01/21/2021    Past Surgical History:  Procedure Laterality Date   BIOPSY  08/16/2023   Procedure: BIOPSY;  Surgeon: Therisa Bi, MD;  Location:  [facility] ENDOSCOPY;  Service: Gastroenterology;;   CESAREAN SECTION     ESOPHAGOGASTRODUODENOSCOPY (EGD) WITH PROPOFOL  N/A 08/16/2023   Procedure: ESOPHAGOGASTRODUODENOSCOPY (EGD) WITH PROPOFOL ;  Surgeon: Therisa Bi, MD;  Location: [facility] ENDOSCOPY;  Service: Gastroenterology;  Laterality: N/A;   FRACTURE SURGERY     GASTRIC BYPASS     HARDWARE REMOVAL Left 03/30/2023   Procedure: Left distal radius DVR plate, hardware, removal;  Surgeon: Kathlynn Sharper, MD;  Location: [facility];  Service: Orthopedics;  Laterality: Left;   LUMBAR LAMINECTOMY/DECOMPRESSION MICRODISCECTOMY Right 02/08/2024   Procedure: LUMBAR LAMINECTOMY/DECOMPRESSION MICRODISCECTOMY 2 LEVELS;  Surgeon: Claudene Penne ORN, MD;  Location: [facility] ORS;  Service: Neurosurgery;  Laterality: Right;  MIS L2-3 HEMILAMINECTOMY L4-5 DISCECTOMY   OPEN REDUCTION INTERNAL FIXATION (ORIF) DISTAL RADIAL FRACTURE Left 12/29/2022   Procedure: OPEN REDUCTION INTERNAL FIXATION (ORIF) DISTAL RADIUS FRACTURE;  Surgeon: Kathlynn Sharper, MD;  Location: [facility];  Service: Orthopedics;  Laterality: Left;   TONSILLECTOMY     TUBAL LIGATION      OB History   No obstetric history on file.      Home Medications    Prior to Admission medications   Medication Sig Start Date End Date Taking? Authorizing Provider  albuterol  (VENTOLIN  HFA) 108 (90 Base) MCG/ACT inhaler Inhale 2 puffs into the lungs every 6 (six) hours as needed for wheezing or shortness of breath. 03/20/24   Melvin Pao, NP  amLODipine  (NORVASC ) 5 MG tablet Take 1 tablet (5 mg total) by mouth daily. 01/25/24   Melvin Pao, NP  aspirin EC 81 MG tablet Take 81 mg by mouth daily. Swallow whole.    [provider]  azithromycin  (ZITHROMAX ) 500 MG tablet Take 1 tablet (500 mg total) by mouth daily for 5 days. 03/30/24 04/04/24 Yes Iola Lukes, FNP  BREZTRI  AEROSPHERE 160-9-4.8 MCG/ACT AERO Inhale 2 puffs into the lungs 2 (two) times daily. 11/11/23   Melvin Pao, NP  brompheniramine-pseudoephedrine-DM 30-2-10 MG/5ML syrup Take 10 mLs by mouth every 6 (six) hours as needed (cough and congestion). 03/30/24  Yes Iola Lukes, FNP  cetirizine (ZYRTEC) 10 MG tablet Take 10 mg by mouth daily.    [provider]  Clotrimazole  1 % OINT Apply 1 application  topically daily. 06/15/23   Melvin Pao, NP  Dextromethorphan-guaiFENesin  (MUCINEX  DM MAXIMUM STRENGTH) 60-1200 MG TB12 Take 1  tablet by mouth 2 (two) times daily. 03/30/24  Yes Iola Lukes, FNP  docusate sodium  (COLACE) 100 MG capsule Take 1 capsule (100 mg total) by mouth 2 (two) times daily. 02/08/24   Ulis Bottcher, PA-C  DULoxetine  (CYMBALTA ) 20 MG capsule Take 1 capsule (20 mg total) by mouth daily. 02/23/24   Melvin Pao, NP  DULoxetine  (CYMBALTA ) 60 MG capsule Take 1 capsule (60 mg total) by mouth daily. 01/25/24   Melvin Pao, NP  ondansetron  (ZOFRAN ) 4 MG tablet Take 1 tablet (4 mg total) by mouth every 8 (eight) hours as needed for nausea or vomiting. 02/08/24   Ulis Bottcher, PA-C  pantoprazole  (PROTONIX ) 40 MG tablet Take 1 tablet (40 mg total) by mouth 2 (two) times daily. 01/25/24   Melvin Pao, NP  predniSONE  (DELTASONE ) 20 MG tablet Take 2 tablets (40 mg total) by mouth daily for 5 days. 03/30/24 04/04/24 Yes Hasani Diemer, FNP  pregabalin  (LYRICA ) 50 MG capsule Take 1 capsule (50 mg total) by mouth 2 (two) times daily. 03/07/24   Melvin Pao, NP  rosuvastatin  (CRESTOR ) 10 MG tablet Take 1 tablet (10 mg total) by mouth daily. 02/07/24  Melvin Pao, NP  senna (SENOKOT) 8.6 MG TABS tablet Take 1 tablet (8.6 mg total) by mouth daily. 02/08/24   Ulis Bottcher, PA-C  temazepam  (RESTORIL ) 30 MG capsule Take 1 capsule (30 mg total) by mouth at bedtime. 01/25/24   Melvin Pao, NP  tiZANidine  (ZANAFLEX ) 4 MG tablet Take 1 tablet (4 mg total) by mouth every 8 (eight) hours as needed. 02/28/24 02/27/25  Ulis Bottcher, PA-C  valsartan  (DIOVAN ) 160 MG tablet Take 1 tablet (160 mg total) by mouth daily. 01/25/24   Melvin Pao, NP    Family History Family History  Problem Relation Age of Onset   COPD Mother    Diabetes Mother    Pneumonia Mother    Huntington's disease Father     Social History Social History   Tobacco Use   Smoking status: Every Day    Current packs/day: 0.50    Average packs/day: 0.5 packs/day for 51.6 years (25.8 ttl pk-yrs)    Types: Cigarettes     Start date: 1974   Smokeless tobacco: Never  Vaping Use   Vaping status: Never Used  Substance Use Topics   Alcohol use: No   Drug use: No     Allergies   Penicillins, Codeine, Levaquin [levofloxacin in d5w], Lisinopril, Methocarbamol , Oxycodone -acetaminophen , Tramadol , and Oxycodone    Review of Systems Review of Systems  Constitutional:  Positive for diaphoresis and fatigue. Negative for chills and fever.  HENT:  Positive for postnasal drip, rhinorrhea and sore throat.   Respiratory:  Positive for cough, chest tightness, shortness of breath and wheezing.   Cardiovascular:  Negative for chest pain, palpitations and leg swelling.  Gastrointestinal:  Positive for nausea. Negative for vomiting.  Musculoskeletal:  Negative for myalgias.  Neurological:  Positive for weakness. Negative for headaches.  All other systems reviewed and are negative.    Physical Exam Triage Vital Signs ED Triage Vitals  Encounter Vitals Group     BP 03/30/24 1547 100/65     Girls Systolic BP Percentile --      Girls Diastolic BP Percentile --      Boys Systolic BP Percentile --      Boys Diastolic BP Percentile --      Pulse Rate 03/30/24 1547 (!) 106     Resp 03/30/24 1547 18     Temp 03/30/24 1547 98.2 F (36.8 C)     Temp src --      SpO2 03/30/24 1547 93 %     Weight --      Height --      Head Circumference --      Peak Flow --      Pain Score 03/30/24 1552 3     Pain Loc --      Pain Education --      Exclude from Growth Chart --    No data found.  Updated Vital Signs BP 100/65   Pulse (!) 106   Temp 98.2 F (36.8 C)   Resp 18   SpO2 94%   Visual Acuity Right Eye Distance:   Left Eye Distance:   Bilateral Distance:    Right Eye Near:   Left Eye Near:    Bilateral Near:     Physical Exam Vitals reviewed.  Constitutional:      General: She is awake. She is not in acute distress.    Appearance: Normal appearance. She is well-developed. She is not ill-appearing,  toxic-appearing or diaphoretic.  HENT:     Head: Normocephalic.  Right Ear: Tympanic membrane, ear canal and external ear normal. No drainage, swelling or tenderness. No middle ear effusion. Tympanic membrane is not erythematous.     Left Ear: Tympanic membrane, ear canal and external ear normal. No drainage, swelling or tenderness.  No middle ear effusion. Tympanic membrane is not erythematous.     Nose: Nose normal. No congestion or rhinorrhea.     Mouth/Throat:     Lips: Pink.     Mouth: Mucous membranes are moist.     Pharynx: No pharyngeal swelling, oropharyngeal exudate, posterior oropharyngeal erythema or uvula swelling.     Tonsils: No tonsillar exudate or tonsillar abscesses.  Eyes:     General: Vision grossly intact.     Conjunctiva/sclera: Conjunctivae normal.  Cardiovascular:     Rate and Rhythm: Normal rate.     Heart sounds: Normal heart sounds.  Pulmonary:     Effort: Pulmonary effort is normal. No tachypnea or respiratory distress.     Breath sounds: Normal breath sounds. No decreased breath sounds, wheezing or rhonchi.     Comments: Respirations even and unlabored  Musculoskeletal:        General: Normal range of motion.     Cervical back: Full passive range of motion without pain, normal range of motion and neck supple.     Right lower leg: No edema.     Left lower leg: No edema.  Lymphadenopathy:     Cervical: No cervical adenopathy.  Skin:    General: Skin is warm and dry.  Neurological:     General: No focal deficit present.     Mental Status: She is alert and oriented to person, place, and time.     Sensory: Sensation is intact.     Motor: Motor function is intact.     Coordination: Coordination is intact.     Gait: Gait is intact.  Psychiatric:        Speech: Speech normal.        Behavior: Behavior is cooperative.      UC Treatments / Results  Labs (all labs ordered are listed, but only abnormal results are displayed) Labs Reviewed  POC SOFIA  SARS ANTIGEN FIA    EKG   Radiology DG Chest 2 View Result Date: 03/30/2024 CLINICAL DATA:  Cough. EXAM: CHEST - 2 VIEW COMPARISON:  June 03, 2014. FINDINGS: The heart size and mediastinal contours are within normal limits. Both lungs are clear. The visualized skeletal structures are unremarkable. IMPRESSION: No active cardiopulmonary disease. Electronically Signed   By: Lynwood Landy Raddle M.D.   On: 03/30/2024 16:49    Procedures Procedures (including critical care time)  Medications Ordered in UC Medications  ipratropium-albuterol  (DUONEB) 0.5-2.5 (3) MG/3ML nebulizer solution 3 mL (3 mLs Nebulization Given 03/30/24 1646)    Initial Impression / Assessment and Plan / UC Course  I have reviewed the triage vital signs and the nursing notes.  Pertinent labs & imaging results that were available during my care of the patient were reviewed by me and considered in my medical decision making (see chart for details).     Patient presents with acute onset of wet cough, shortness of breath, fatigue, sweating, and nausea that began yesterday. These symptoms followed several days of sore throat, nasal drainage, and postnasal drip. The patient has a history of COPD and prior pneumonia. On exam, she is afebrile, nontoxic, and in no acute distress, with clear lung sounds. Due to the sudden onset of systemic symptoms and respiratory  history, further evaluation was performed to rule out pneumonia or other infection. A nebulizer treatment was administered in clinic for chest tightness, and a chest X-ray was obtained, which showed no evidence of pneumonia or other pulmonary pathology. COVID testing negative as well. Patient was treated with an intramuscular injection of Decadron  and prescribed a course of azithromycin , oral prednisone , Mucinex  DM, and Bromfed-DM. She was advised to continue using Breztri  and albuterol  inhalers as directed. Supportive care and smoking cessation were encouraged. Patient  instructed to follow up with her PCP as needed and return to the emergency department if symptoms worsen, including increased shortness of breath, high fever, chest pain, confusion, or inability to keep fluids down.  Today's evaluation has revealed no signs of a dangerous process. Discussed diagnosis with patient and/or guardian. Patient and/or guardian aware of their diagnosis, possible red flag symptoms to watch out for and need for close follow up. Patient and/or guardian understands verbal and written discharge instructions. Patient and/or guardian comfortable with plan and disposition.  Patient and/or guardian has a clear mental status at this time, good insight into illness (after discussion and teaching) and has clear judgment to make decisions regarding their care  Documentation was completed with the aid of voice recognition software. Transcription may contain typographical errors.  Final Clinical Impressions(s) / UC Diagnoses   Final diagnoses:  Acute cough  COPD with acute exacerbation (HCC)  Upper respiratory tract infection, unspecified type     Discharge Instructions      You were seen today for a wet cough, shortness of breath, fatigue, sweating, and nausea that began abruptly yesterday following several days of cough, nasal drainage, and postnasal drip. A chest X-ray showed no signs of pneumonia or other lung infection. You received a breathing treatment and a steroid injection in clinic. You were also prescribed azithromycin , prednisone , Mucinex  DM, and Bromfed-DM to help manage your symptoms. Continue taking your Breztri  and albuterol  inhalers as directed.  To manage your symptoms at home, rest as much as possible, stay well hydrated, and avoid smoking or secondhand smoke. Use your prescribed inhalers regularly and take medications exactly as directed. Mucinex  DM and Bromfed-DM may help relieve cough and mucus buildup, while prednisone  will reduce inflammation in your  airways.  Contact your primary care provider if your symptoms are not improving within a few days or if new symptoms develop. Go to the emergency department if you experience worsening shortness of breath, chest pain, persistent vomiting, confusion, high fever, or if you are unable to keep fluids down. Smoking cessation is strongly recommended and will help improve your long-term respiratory health.      ED Prescriptions     Medication Sig Dispense Auth. Provider   azithromycin  (ZITHROMAX ) 500 MG tablet Take 1 tablet (500 mg total) by mouth daily for 5 days. 5 tablet Iola Lukes, FNP   brompheniramine-pseudoephedrine-DM 30-2-10 MG/5ML syrup Take 10 mLs by mouth every 6 (six) hours as needed (cough and congestion). 120 mL Iola Lukes, FNP   Dextromethorphan-guaiFENesin  (MUCINEX  DM MAXIMUM STRENGTH) 60-1200 MG TB12 Take 1 tablet by mouth 2 (two) times daily. 20 tablet Iola Lukes, FNP   predniSONE  (DELTASONE ) 20 MG tablet Take 2 tablets (40 mg total) by mouth daily for 5 days. 10 tablet Iola Lukes, FNP      PDMP not reviewed this encounter.   Iola Lukes, [place] 03/30/24 1719

## 2024-03-30 NOTE — Discharge Instructions (Addendum)
 You were seen today for a wet cough, shortness of breath, fatigue, sweating, and nausea that began abruptly yesterday following several days of cough, nasal drainage, and postnasal drip. A chest X-ray showed no signs of pneumonia or other lung infection. You received a breathing treatment and a steroid injection in clinic. You were also prescribed azithromycin , prednisone , Mucinex  DM, and Bromfed-DM to help manage your symptoms. Continue taking your Breztri  and albuterol  inhalers as directed.  To manage your symptoms at home, rest as much as possible, stay well hydrated, and avoid smoking or secondhand smoke. Use your prescribed inhalers regularly and take medications exactly as directed. Mucinex  DM and Bromfed-DM may help relieve cough and mucus buildup, while prednisone  will reduce inflammation in your airways.  Contact your primary care provider if your symptoms are not improving within a few days or if new symptoms develop. Go to the emergency department if you experience worsening shortness of breath, chest pain, persistent vomiting, confusion, high fever, or if you are unable to keep fluids down. Smoking cessation is strongly recommended and will help improve your long-term respiratory health.

## 2024-03-30 NOTE — ED Triage Notes (Addendum)
 Patient to Urgent Care with complaints of wet and congested cough. Symptoms x1 week. Denies any fevers. Taking mucinex / liquid iv and pushing fluids/ using prescription inhalers.    Reports she was at work yesterday and had an episode where she felt weak, nauseous, diaphoretic. Had a similar episode today. Denies any CP.

## 2024-03-30 NOTE — Progress Notes (Signed)
 [place]   Needs in person for EKG- having fatigue and sweating, along with cough at times over last 24 hours. Would benefit from covid swab as well.   Patient acknowledged agreement and understanding of the plan.

## 2024-04-07 ENCOUNTER — Ambulatory Visit
Admission: RE | Admit: 2024-04-07 | Discharge: 2024-04-07 | Disposition: A | Discharge status: Home / Self Care | Source: Ambulatory Visit | Attending: Neurosurgery | Admitting: Neurosurgery

## 2024-04-07 ENCOUNTER — Ambulatory Visit
Admission: RE | Admit: 2024-04-07 | Discharge: 2024-04-07 | Disposition: A | Discharge status: Home / Self Care | Attending: Neurosurgery | Admitting: Neurosurgery

## 2024-04-07 DIAGNOSIS — M48062 Spinal stenosis, lumbar region with neurogenic claudication: Secondary | ICD-10-CM | POA: Diagnosis not present

## 2024-04-07 DIAGNOSIS — M51369 Other intervertebral disc degeneration, lumbar region without mention of lumbar back pain or lower extremity pain: Secondary | ICD-10-CM | POA: Diagnosis not present

## 2024-04-07 DIAGNOSIS — M47816 Spondylosis without myelopathy or radiculopathy, lumbar region: Secondary | ICD-10-CM | POA: Diagnosis not present

## 2024-04-07 DIAGNOSIS — Z0389 Encounter for observation for other suspected diseases and conditions ruled out: Secondary | ICD-10-CM | POA: Diagnosis not present

## 2024-04-07 DIAGNOSIS — I700 Atherosclerosis of aorta: Secondary | ICD-10-CM | POA: Diagnosis not present

## 2024-04-14 DIAGNOSIS — M654 Radial styloid tenosynovitis [de Quervain]: Secondary | ICD-10-CM | POA: Diagnosis not present

## 2024-04-26 ENCOUNTER — Ambulatory Visit (INDEPENDENT_AMBULATORY_CARE_PROVIDER_SITE_OTHER): Admitting: Nurse Practitioner

## 2024-04-26 ENCOUNTER — Encounter: Payer: Self-pay | Admitting: Nurse Practitioner

## 2024-04-26 VITALS — BP 110/62 | HR 86 | Temp 97.9°F | Resp 15 | Ht 64.02 in | Wt 192.6 lb

## 2024-04-26 DIAGNOSIS — R0602 Shortness of breath: Secondary | ICD-10-CM

## 2024-04-26 DIAGNOSIS — R0681 Apnea, not elsewhere classified: Secondary | ICD-10-CM | POA: Diagnosis not present

## 2024-04-26 DIAGNOSIS — J449 Chronic obstructive pulmonary disease, unspecified: Secondary | ICD-10-CM | POA: Diagnosis not present

## 2024-04-26 DIAGNOSIS — I10 Essential (primary) hypertension: Secondary | ICD-10-CM | POA: Diagnosis not present

## 2024-04-26 DIAGNOSIS — F419 Anxiety disorder, unspecified: Secondary | ICD-10-CM | POA: Diagnosis not present

## 2024-04-26 DIAGNOSIS — E782 Mixed hyperlipidemia: Secondary | ICD-10-CM

## 2024-04-26 DIAGNOSIS — F339 Major depressive disorder, recurrent, unspecified: Secondary | ICD-10-CM

## 2024-04-26 DIAGNOSIS — F5101 Primary insomnia: Secondary | ICD-10-CM

## 2024-04-26 DIAGNOSIS — D509 Iron deficiency anemia, unspecified: Secondary | ICD-10-CM

## 2024-04-26 HISTORY — DX: Mixed hyperlipidemia: E78.2

## 2024-04-26 MED ORDER — TEMAZEPAM 30 MG PO CAPS: 30.0000 mg | ORAL_CAPSULE | Freq: Every day | ORAL | 2 refills | Status: DC

## 2024-04-26 MED ORDER — DULOXETINE HCL 30 MG PO CPEP: 90.0000 mg | ORAL_CAPSULE | Freq: Every day | ORAL | 1 refills | Status: DC

## 2024-04-26 NOTE — Assessment & Plan Note (Signed)
 Chronic.  Controlled.  Continue with current medication regimen of Rosuvastatin  10mg .  Labs ordered today.  Return to clinic in 6 months for reevaluation.  Call sooner if concerns arise.

## 2024-04-26 NOTE — Progress Notes (Signed)
 BP 110/62 (BP Location: Left Arm, Patient Position: Sitting, Cuff Size: Large)   Pulse 86   Temp 97.9 F (36.6 C) (Oral)   Resp 15   Ht 5' 4.02 (1.626 m)   Wt 192 lb 9.6 oz (87.4 kg)   SpO2 98%   BMI 33.04 kg/m    Subjective:    Patient ID: Cassandra Brandt, female    DOB: 03/20/1956, 68 y.o.   MRN: [id number]  HPI: Cassandra Brandt is a 68 y.o. female  Chief Complaint  Patient presents with   Follow-up   INSOMNIA Doing well with the Temazepam .  Denies concerns at visit today. She would like to do an updated sleep study and potentially try Zepbound to help with OSA.  She does snore very loudly.  Duration: years Satisfied with sleep quality: yes Difficulty falling asleep: no Difficulty staying asleep: no Waking a few hours after sleep onset: no Early morning awakenings: no Daytime hypersomnolence: yes Wakes feeling refreshed: yes Good sleep hygiene: yes Apnea: no Snoring: yes Depressed/anxious mood: yes Recent stress: yes Restless legs/nocturnal leg cramps: no Chronic pain/arthritis: no History of sleep study: no Treatments attempted: temazepam       04/26/2024   10:00 AM  Results of the Epworth flowsheet  Sitting and reading 1  Watching TV 3  Sitting, inactive in a public place (e.g. a theatre or a meeting) 0  As a passenger in a car for an hour without a break 0  Lying down to rest in the afternoon when circumstances permit 3  Sitting and talking to someone 0  Sitting quietly after a lunch without alcohol 0  In a car, while stopped for a few minutes in traffic 0  Total score 7     DEPRESSION Patient states she is in a better place.  She needs an updated prescription of Duloxetine .  She would like to increase it a little bit. Otherwise, feels like she is doing well and denies concerns at visit today.    Flowsheet Row Office Visit from 04/26/2024 in [facility]  PHQ-9 Total Score 6      04/26/2024   10:20 AM 03/07/2024    3:14 PM  01/25/2024    9:44 AM 10/26/2023    9:14 AM  GAD 7 : Generalized Anxiety Score  Nervous, Anxious, on Edge 1 0 1 1  Control/stop worrying 1 0 1 1  Worry too much - different things 1 0 1 1  Trouble relaxing 2 0 1 1  Restless 1 0 1 1  Easily annoyed or irritable 2 0 1 1  Afraid - awful might happen 0 0 0 1  Total GAD 7 Score 8 0 6 7  Anxiety Difficulty Not difficult at all Not difficult at all  Somewhat difficult    COPD Using Breztri  and doing well.   COPD status: controlled Satisfied with current treatment?: yes Oxygen use: no Dyspnea frequency:  none Cough frequency: none Rescue inhaler frequency:  none Limitation of activity: yes Productive cough: none Last Spirometry:  Pneumovax: Up to Date Influenza: Up to Date  Patient states she feels like her chest is heavy, ankles are swelling when she works and has a wet cough.  Symptoms have been Brandt for about a month.  She was seen in [facility] on 7/31 and started on Prednisone .      BACK PAIN States she still likes the pregabalin .  The numbness in her leg is better.    Relevant past  medical, surgical, family and social history reviewed and updated as indicated. Interim medical history since our last visit reviewed. Allergies and medications reviewed and updated.  Review of Systems  Eyes:  Negative for visual disturbance.  Respiratory:  Positive for shortness of breath. Negative for cough and chest tightness.   Cardiovascular:  Positive for leg swelling. Negative for chest pain and palpitations.  Neurological:  Negative for dizziness and headaches.  Psychiatric/Behavioral:  Positive for dysphoric mood and sleep disturbance. Negative for self-injury. The patient is nervous/anxious.     Per HPI unless specifically indicated above     Objective:    BP 110/62 (BP Location: Left Arm, Patient Position: Sitting, Cuff Size: Large)   Pulse 86   Temp 97.9 F (36.6 C) (Oral)   Resp 15   Ht 5' 4.02 (1.626 m)   Wt 192 lb 9.6 oz (87.4  kg)   SpO2 98%   BMI 33.04 kg/m   Wt Readings from Last 3 Encounters:  04/26/24 192 lb 9.6 oz (87.4 kg)  03/20/24 188 lb (85.3 kg)  03/07/24 188 lb (85.3 kg)    Physical Exam Vitals and nursing note reviewed.  Constitutional:      General: She is not in acute distress.    Appearance: Normal appearance. She is normal weight. She is not ill-appearing, toxic-appearing or diaphoretic.  HENT:     Head: Normocephalic.     Right Ear: External ear normal.     Left Ear: External ear normal.     Nose: Nose normal.     Mouth/Throat:     Mouth: Mucous membranes are moist.     Pharynx: Oropharynx is clear.  Eyes:     General:        Right eye: No discharge.        Left eye: No discharge.     Extraocular Movements: Extraocular movements intact.     Conjunctiva/sclera: Conjunctivae normal.     Pupils: Pupils are equal, round, and reactive to light.  Cardiovascular:     Rate and Rhythm: Normal rate and regular rhythm.     Heart sounds: No murmur heard. Pulmonary:     Effort: Pulmonary effort is normal. No respiratory distress.     Breath sounds: Normal breath sounds. No wheezing or rales.  Musculoskeletal:     Cervical back: Normal range of motion and neck supple.     Right lower leg: No edema.     Left lower leg: No edema.  Skin:    General: Skin is warm and dry.     Capillary Refill: Capillary refill takes less than 2 seconds.  Neurological:     General: No focal deficit present.     Mental Status: She is alert and oriented to person, place, and time. Mental status is at baseline.  Psychiatric:        Mood and Affect: Mood normal.        Behavior: Behavior normal.        Thought Content: Thought content normal.        Judgment: Judgment normal.     Results for orders placed or performed during the hospital encounter of 03/30/24  POC SARS Coronavirus 2 Ag   Collection Time: 03/30/24  5:01 PM  Result Value Ref Range   SARS Coronavirus 2 Ag Negative Negative      Assessment  & Plan:   Problem List Items Addressed This Visit       Cardiovascular and Mediastinum   Essential hypertension -  Primary   Chronic. Controlled.  Continue to check blood pressures at home.  Bring log to next visit.  Continue with Valsartan  and Amlodipine .  Refills sent today.  Labs ordered.  Follow up in 3 months.  Call sooner if concerns arise.       Relevant Orders   Comprehensive metabolic panel with GFR     Respiratory   Chronic obstructive pulmonary disease, unspecified (HCC)   Chronic. Well controlled.  CCM was successful at getting Breztri  via PAP.  Continue with Breztri .  Not using any albuterol .  Follow up in 3 months.  Call sooner if concerns arise.         Other   Anxiety   Chronic. Doing well with Duloxetine  60mg .  Continue with current medication regimen.   Follow up in 3 months.  Call sooner if concerns arise.       Relevant Medications   DULoxetine  (CYMBALTA ) 30 MG capsule   Iron deficiency anemia   Chronic.  Controlled.  Continue with current medication regimen.  Labs ordered today.  Return to clinic in 6 months for reevaluation.  Call sooner if concerns arise.        Relevant Orders   CBC w/Diff   Primary insomnia   Chronic. Controlled with Temazepam .  Patient aware of risks of taking medication and okay with continuing.  UDS and controlled substance agreement up to date as of January 2025. Refills sent today.  Side effects and benefits of medication discussed during visit.  Follow up in 3 months.      Depression, recurrent (HCC)   Chronic.  Controlled.  Will increase dose of Duloxetine  to 90mg  daily.  Return to clinic in 3 months for reevaluation.  Call sooner if concerns arise.       Relevant Medications   DULoxetine  (CYMBALTA ) 30 MG capsule   Mixed hyperlipidemia   Chronic.  Controlled.  Continue with current medication regimen of Rosuvastatin  10mg .  Labs ordered today.  Return to clinic in 6 months for reevaluation.  Call sooner if concerns arise.         Relevant Orders   Lipid panel   Other Visit Diagnoses       Apnea       Home sleep study ordered.  Not currently on CPAP. Will need updated supplies and interested in starting Zepbound.   Relevant Orders   Home sleep test     Shortness of breath       Due to symptoms of SOB, Intermittent swelling and chest heaviness will order BNP, DDimer and ECHO. Will follow up once results have returned. Euvolemic on exam.   Relevant Orders   ECHOCARDIOGRAM COMPLETE   B Nat Peptide   D-Dimer, Quantitative        Follow up plan: No follow-ups on file.

## 2024-04-26 NOTE — Assessment & Plan Note (Signed)
 Chronic. Controlled with Temazepam .  Patient aware of risks of taking medication and okay with continuing.  UDS and controlled substance agreement up to date as of January 2025. Refills sent today.  Side effects and benefits of medication discussed during visit.  Follow up in 3 months.

## 2024-04-26 NOTE — Assessment & Plan Note (Signed)
 Chronic.  Controlled.  Will increase dose of Duloxetine  to 90mg  daily.  Return to clinic in 3 months for reevaluation.  Call sooner if concerns arise.

## 2024-04-26 NOTE — Assessment & Plan Note (Signed)
 Chronic.  Controlled.  Continue with current medication regimen.  Labs ordered today.  Return to clinic in 6 months for reevaluation.  Call sooner if concerns arise.  ? ?

## 2024-04-26 NOTE — Assessment & Plan Note (Signed)
 Chronic. Controlled.  Continue to check blood pressures at home.  Bring log to next visit.  Continue with Valsartan  and Amlodipine .  Refills sent today.  Labs ordered.  Follow up in 3 months.  Call sooner if concerns arise.

## 2024-04-26 NOTE — Assessment & Plan Note (Signed)
 Chronic. Doing well with Duloxetine  60mg .  Continue with current medication regimen.   Follow up in 3 months.  Call sooner if concerns arise.

## 2024-04-26 NOTE — Assessment & Plan Note (Signed)
 Chronic. Well controlled.  CCM was successful at getting Breztri  via PAP.  Continue with Breztri .  Not using any albuterol .  Follow up in 3 months.  Call sooner if concerns arise.

## 2024-04-27 ENCOUNTER — Ambulatory Visit: Payer: Self-pay | Admitting: Nurse Practitioner

## 2024-04-27 DIAGNOSIS — I5189 Other ill-defined heart diseases: Secondary | ICD-10-CM

## 2024-04-27 LAB — COMPREHENSIVE METABOLIC PANEL WITH GFR
ALT: 12 IU/L (ref 0–32)
AST: 16 IU/L (ref 0–40)
Albumin: 4.2 g/dL (ref 3.9–4.9)
Alkaline Phosphatase: 101 IU/L (ref 44–121)
BUN/Creatinine Ratio: 21 (ref 12–28)
BUN: 15 mg/dL (ref 8–27)
Bilirubin Total: 0.3 mg/dL (ref 0.0–1.2)
CO2: 25 mmol/L (ref 20–29)
Calcium: 9.3 mg/dL (ref 8.7–10.3)
Chloride: 104 mmol/L (ref 96–106)
Creatinine, Ser: 0.7 mg/dL (ref 0.57–1.00)
Globulin, Total: 2 g/dL (ref 1.5–4.5)
Glucose: 82 mg/dL (ref 70–99)
Potassium: 3.9 mmol/L (ref 3.5–5.2)
Sodium: 142 mmol/L (ref 134–144)
Total Protein: 6.2 g/dL (ref 6.0–8.5)
eGFR: 94 mL/min/1.73 (ref >59)

## 2024-04-27 LAB — CBC WITH DIFFERENTIAL/PLATELET
Basophils Absolute: 0 x10E3/uL (ref 0.0–0.2)
Basos: 0 %
EOS (ABSOLUTE): 0.1 x10E3/uL (ref 0.0–0.4)
Eos: 2 %
Hematocrit: 44.2 % (ref 34.0–46.6)
Hemoglobin: 13.9 g/dL (ref 11.1–15.9)
Immature Grans (Abs): 0 x10E3/uL (ref 0.0–0.1)
Immature Granulocytes: 0 %
Lymphocytes Absolute: 2 x10E3/uL (ref 0.7–3.1)
Lymphs: 26 %
MCH: 29.9 pg (ref 26.6–33.0)
MCHC: 31.4 g/dL — ABNORMAL LOW (ref 31.5–35.7)
MCV: 95 fL (ref 79–97)
Monocytes Absolute: 0.5 x10E3/uL (ref 0.1–0.9)
Monocytes: 6 %
Neutrophils Absolute: 5.1 x10E3/uL (ref 1.4–7.0)
Neutrophils: 66 %
Platelets: 265 x10E3/uL (ref 150–450)
RBC: 4.65 x10E6/uL (ref 3.77–5.28)
RDW: 14.7 % (ref 11.7–15.4)
WBC: 7.7 x10E3/uL (ref 3.4–10.8)

## 2024-04-27 LAB — LIPID PANEL
Chol/HDL Ratio: 2.5 ratio (ref 0.0–4.4)
Cholesterol, Total: 113 mg/dL (ref 100–199)
HDL: 45 mg/dL (ref >39)
LDL Chol Calc (NIH): 52 mg/dL (ref 0–99)
Triglycerides: 76 mg/dL (ref 0–149)
VLDL Cholesterol Cal: 16 mg/dL (ref 5–40)

## 2024-04-27 LAB — D-DIMER, QUANTITATIVE: D-DIMER: 0.57 mg{FEU}/L — ABNORMAL HIGH (ref 0.00–0.49)

## 2024-04-27 LAB — BRAIN NATRIURETIC PEPTIDE: BNP: 22.7 pg/mL (ref 0.0–100.0)

## 2024-04-28 ENCOUNTER — Encounter: Payer: Self-pay | Admitting: Nurse Practitioner

## 2024-05-02 ENCOUNTER — Encounter: Admitting: Physician Assistant

## 2024-05-03 ENCOUNTER — Ambulatory Visit: Admitting: Physician Assistant

## 2024-05-03 ENCOUNTER — Encounter: Payer: Self-pay | Admitting: Physician Assistant

## 2024-05-03 VITALS — BP 126/86 | Temp 98.6°F | Ht 64.02 in | Wt 192.0 lb

## 2024-05-03 DIAGNOSIS — Z09 Encounter for follow-up examination after completed treatment for conditions other than malignant neoplasm: Secondary | ICD-10-CM

## 2024-05-03 DIAGNOSIS — M48062 Spinal stenosis, lumbar region with neurogenic claudication: Secondary | ICD-10-CM

## 2024-05-03 DIAGNOSIS — Z9889 Other specified postprocedural states: Secondary | ICD-10-CM

## 2024-05-03 MED ORDER — METAXALONE 800 MG PO TABS
800.0000 mg | ORAL_TABLET | Freq: Three times a day (TID) | ORAL | 0 refills | Status: DC
Start: 2024-05-03 — End: 2024-06-05

## 2024-05-03 MED ORDER — CELECOXIB 200 MG PO CAPS
200.0000 mg | ORAL_CAPSULE | Freq: Two times a day (BID) | ORAL | 1 refills | Status: DC
Start: 2024-05-03 — End: 2024-06-05

## 2024-05-03 NOTE — Progress Notes (Unsigned)
 REFERRING PHYSICIAN:  Melvin Pao, [address] [place],  [place] [address]  DOS: 02/08/2024 MIS L2-3 hemilaminectomy, L4-5 discectomy  HISTORY OF PRESENT ILLNESS: Cassandra Brandt is approximately 6 weeks status post MIS L2-3 hemilaminectomy, L4-5 discectomy. she is doing well.  Her back pain has improved significantly.  She has had some improvement in her distal right lower extremity symptoms, however her more proximal symptoms including pain in her hip have continued to be present.    Lots of pain, rle going down to top of foot. Right leg is heavy and hard to pickup   PHYSICAL EXAMINATION:  General: Patient is well developed, well nourished, calm, collected, and in no apparent distress.   NEUROLOGICAL:  General: In no acute distress.   Awake, alert, oriented to person, place, and time.  Pupils equal round and reactive to light.  Facial tone is symmetric.    Pain with IR and ER of hip Strength:            Side Iliopsoas Quads Hamstring PF DF EHL  R 5 5 5 5 5 5   L 5 5 5 5 5 5    Incision c/d/i  She has significant pain with internal/external rotation of her right hip.  She also has pain to palpation in the area of her trochanteric bursa and IT band.  ROS (Neurologic):  Negative except as noted above  IMAGING: EXAM: DG SCOLIOSIS EVAL COMPLETE SPINE 2-3V   COMPARISON:  None Available.   FINDINGS: Gentle S shaped dextro levoscoliosis of the lower thoracic and lumbar spine,, dextro Cobb angle 11 degrees, apex T12-L1, levo Cobb angle 16 degrees, apex L3-L4.   IMPRESSION: Gentle S shaped dextro levoscoliosis of the lower thoracic and lumbar spine, dextro Cobb angle 11 degrees, apex T12-L1, levo Cobb angle 16 degrees, apex L3-L4.  EXAM: LUMBAR SPINE - COMPLETE 4+ VIEW   COMPARISON:  02/08/2024   FINDINGS: No fracture or dislocation of the lumbar spine. Gentle levoscoliosis apex L3-L4. Severe multilevel lumbar disc degenerative disease throughout, with severe  facet degenerative change of the lower lumbar levels. No significant dynamic listhesis on flexion and extension views. Nonobstructive pattern of overlying bowel gas. Aortic atherosclerosis.   IMPRESSION: 1. No fracture or dislocation of the lumbar spine. 2. Gentle levoscoliosis apex L3-L4. 3. Severe multilevel lumbar disc degenerative disease throughout, with severe facet degenerative change of the lower lumbar levels. 4. No significant dynamic listhesis on flexion and extension views.   ASSESSMENT/PLAN:  JOWANNA LOEFFLER is doing well approximately 6 weeks after MIS L2-3 hemilaminectomy and L4-5 discectomy.  She does continue to have pain in her right hip.  This likely secondary to her scoliosis and pelvic tilt.  It is positive and provoked by internal/external rotation of the right hip and with palpation to the region of the trochanteric bursa.  I would like to have her evaluated by her pain and orthopedics team for any local treatments.  From a neurologic standpoint she has had an improvement in some of her more distal symptoms, her back pain is also better.  She does continue to have a spinal deformity, I let her know that if her hip pain was in fact neurologic that she would need a more extensive intervention such as interbody fusions for indirect decompression, at this time I do not think that majority of her issues are coming from a nerve root level and will see if she can get any relief with hip focused interventions.   - menz message  -  injection with morales on the 8th  Message holdsowrth upping 75 twice a day   Will consider scoliosis x-rays at her next visit if she continues to have symptoms.  Advised to contact the office if any questions or concerns arise.  Penne MICAEL Sharps, MD Department of neurosurgery

## 2024-05-08 DIAGNOSIS — G8929 Other chronic pain: Secondary | ICD-10-CM | POA: Diagnosis not present

## 2024-05-08 DIAGNOSIS — M25551 Pain in right hip: Secondary | ICD-10-CM | POA: Diagnosis not present

## 2024-05-09 ENCOUNTER — Encounter: Payer: Self-pay | Admitting: Nurse Practitioner

## 2024-05-09 DIAGNOSIS — R0683 Snoring: Secondary | ICD-10-CM

## 2024-05-11 ENCOUNTER — Encounter: Payer: Self-pay | Admitting: Nurse Practitioner

## 2024-05-11 DIAGNOSIS — J449 Chronic obstructive pulmonary disease, unspecified: Secondary | ICD-10-CM

## 2024-05-11 MED ORDER — COVID-19 MRNA VAC-TRIS(PFIZER) 30 MCG/0.3ML IM SUSY: 0.3000 mL | PREFILLED_SYRINGE | Freq: Once | INTRAMUSCULAR | 0 refills | Status: AC

## 2024-05-16 ENCOUNTER — Telehealth: Payer: Self-pay

## 2024-05-16 MED ORDER — BREZTRI AEROSPHERE 160-9-4.8 MCG/ACT IN AERO: 2.0000 | INHALATION_SPRAY | Freq: Two times a day (BID) | RESPIRATORY_TRACT | 4 refills | Status: AC

## 2024-05-16 NOTE — Telephone Encounter (Signed)
 Medication sent to the pharmacy.

## 2024-05-16 NOTE — Addendum Note (Signed)
 Addended by: MELVIN PAO on: 05/16/2024 08:08 AM   Modules accepted: Orders

## 2024-05-16 NOTE — Telephone Encounter (Signed)
 Received a refill request from [facility] for a new rx on Brezri can be fax to [facility] at [phone].

## 2024-05-19 ENCOUNTER — Ambulatory Visit
Admission: RE | Admit: 2024-05-19 | Discharge: 2024-05-19 | Disposition: A | Discharge status: Home / Self Care | Source: Ambulatory Visit | Attending: Nurse Practitioner | Admitting: Nurse Practitioner

## 2024-05-19 ENCOUNTER — Encounter: Payer: Self-pay | Admitting: Nurse Practitioner

## 2024-05-19 DIAGNOSIS — I10 Essential (primary) hypertension: Secondary | ICD-10-CM | POA: Insufficient documentation

## 2024-05-19 DIAGNOSIS — G4730 Sleep apnea, unspecified: Secondary | ICD-10-CM | POA: Insufficient documentation

## 2024-05-19 DIAGNOSIS — R0602 Shortness of breath: Secondary | ICD-10-CM | POA: Diagnosis not present

## 2024-05-19 DIAGNOSIS — R0600 Dyspnea, unspecified: Secondary | ICD-10-CM | POA: Insufficient documentation

## 2024-05-19 DIAGNOSIS — J449 Chronic obstructive pulmonary disease, unspecified: Secondary | ICD-10-CM | POA: Diagnosis not present

## 2024-05-19 LAB — ECHOCARDIOGRAM COMPLETE
Area-P 1/2: 4.31 cm2
S' Lateral: 2.2 cm

## 2024-05-19 NOTE — Progress Notes (Signed)
  Echocardiogram 2D Echocardiogram has been performed.  Cassandra Brandt 05/19/2024, 8:57 AM

## 2024-05-22 ENCOUNTER — Encounter: Payer: Self-pay | Admitting: Nurse Practitioner

## 2024-05-23 ENCOUNTER — Ambulatory Visit: Attending: Cardiovascular Disease | Admitting: Cardiovascular Disease

## 2024-05-23 ENCOUNTER — Encounter: Payer: Self-pay | Admitting: Cardiovascular Disease

## 2024-05-23 VITALS — BP 122/80 | HR 84 | Ht 64.0 in | Wt 189.0 lb

## 2024-05-23 DIAGNOSIS — I10 Essential (primary) hypertension: Secondary | ICD-10-CM

## 2024-05-23 DIAGNOSIS — I5189 Other ill-defined heart diseases: Secondary | ICD-10-CM

## 2024-05-23 DIAGNOSIS — E782 Mixed hyperlipidemia: Secondary | ICD-10-CM | POA: Diagnosis not present

## 2024-05-23 NOTE — Progress Notes (Signed)
 Cardiology Office Note  Date:  05/23/2024   ID:  Cassandra, Brandt Feb 09, 1956, MRN [id number]  PCP:  Melvin Pao, NP   Chief Complaint  Patient presents with   New Patient (Initial Visit)    Diastolic dysfunction no complaints today. Meds reviewed verbally with pt.    HPI:  Cassandra Brandt a 68 y.o. femalewith past medical history of: Back pain L2-3 hemilaminectomy, L4-5 discectomy  Insomnia Anxiety/adult ADHD/depression Smokes 1/2 ppd Who presents by referral from Pao Melvin for diastolic dysfunction  Clemens 1 year ago, broke wrist Dr. Kathlynn did surgery x2  Recently had symptoms that she was short of breath  build up fluid in chest   Echocardiogram May 19, 2024 Details reviewed with her on today's visit EF 60 to 65%, grade 1 diastolic dysfunction No significant valvular heart disease, no elevated pressures IVC not dilated  BNP 22 No significant lower extremity edema No PND orthopnea Denies chest pain on exertion  Cholesterol well-controlled on Crestor  10 daily Blood pressure well-controlled  Works part-time at a [employer] Does medical online billing classes Smokes 1/2 ppd  EKG personally reviewed by myself on todays visit EKG Interpretation Date/Time:  Tuesday May 23 2024 10:57:42 EDT Ventricular Rate:  84 PR Interval:  166 QRS Duration:  94 QT Interval:  394 QTC Calculation: 465 R Axis:   7  Text Interpretation: Normal sinus rhythm Normal ECG When compared with ECG of 27-Jan-2024 11:49, No significant change was found Confirmed by Perla Lye [phone]) on 05/23/2024 11:17:12 AM     PMH:   has a past medical history of 3-vessel CAD, Allergy, Anemia, Anxiety, Arthritis, COPD (chronic obstructive pulmonary disease) (HCC), Depression, GERD (gastroesophageal reflux disease), History of Roux-en-Y gastric bypass, Hypertension, Neuromuscular disorder (HCC), Restless leg syndrome, Sleep apnea, Spinal stenosis, lumbar region with  neurogenic claudication, Vertigo, and Wears dentures.  PSH:    Past Surgical History:  Procedure Laterality Date   BIOPSY  08/16/2023   Procedure: BIOPSY;  Surgeon: Therisa Bi, MD;  Location: [facility] ENDOSCOPY;  Service: Gastroenterology;;   CESAREAN SECTION     ESOPHAGOGASTRODUODENOSCOPY (EGD) WITH PROPOFOL  N/A 08/16/2023   Procedure: ESOPHAGOGASTRODUODENOSCOPY (EGD) WITH PROPOFOL ;  Surgeon: Therisa Bi, MD;  Location: [facility] ENDOSCOPY;  Service: Gastroenterology;  Laterality: N/A;   FRACTURE SURGERY     GASTRIC BYPASS     HARDWARE REMOVAL Left 03/30/2023   Procedure: Left distal radius DVR plate, hardware, removal;  Surgeon: Kathlynn Sharper, MD;  Location: [facility];  Service: Orthopedics;  Laterality: Left;   LUMBAR LAMINECTOMY/DECOMPRESSION MICRODISCECTOMY Right 02/08/2024   Procedure: LUMBAR LAMINECTOMY/DECOMPRESSION MICRODISCECTOMY 2 LEVELS;  Surgeon: Claudene Penne ORN, MD;  Location: [facility] ORS;  Service: Neurosurgery;  Laterality: Right;  MIS L2-3 HEMILAMINECTOMY L4-5 DISCECTOMY   OPEN REDUCTION INTERNAL FIXATION (ORIF) DISTAL RADIAL FRACTURE Left 12/29/2022   Procedure: OPEN REDUCTION INTERNAL FIXATION (ORIF) DISTAL RADIUS FRACTURE;  Surgeon: Kathlynn Sharper, MD;  Location: [facility];  Service: Orthopedics;  Laterality: Left;   TONSILLECTOMY     TUBAL LIGATION      Current Outpatient Medications  Medication Sig Dispense Refill   albuterol  (VENTOLIN  HFA) 108 (90 Base) MCG/ACT inhaler Inhale 2 puffs into the lungs every 6 (six) hours as needed for wheezing or shortness of breath. 18 g 1   amLODipine  (NORVASC ) 5 MG tablet Take 1 tablet (5 mg total) by mouth daily. 90 tablet 1   aspirin EC 81 MG tablet Take 81 mg by mouth daily. Swallow whole.     BREZTRI   AEROSPHERE 160-9-4.8 MCG/ACT AERO inhaler Inhale 2 puffs into the lungs 2 (two) times daily. 3 each 4   celecoxib  (CELEBREX ) 200 MG capsule Take 1 capsule (200 mg total) by mouth 2 (two) times daily. 60 capsule 1    cetirizine (ZYRTEC) 10 MG tablet Take 10 mg by mouth daily.     Clotrimazole  1 % OINT Apply 1 application  topically daily. 56.7 g 1   DULoxetine  (CYMBALTA ) 30 MG capsule Take 3 capsules (90 mg total) by mouth daily. 270 capsule 1   metaxalone  (SKELAXIN ) 800 MG tablet Take 1 tablet (800 mg total) by mouth 3 (three) times daily. 90 tablet 0   pantoprazole  (PROTONIX ) 40 MG tablet Take 1 tablet (40 mg total) by mouth 2 (two) times daily. 180 tablet 1   pregabalin  (LYRICA ) 50 MG capsule Take 1 capsule (50 mg total) by mouth 2 (two) times daily. 180 capsule 0   rosuvastatin  (CRESTOR ) 10 MG tablet Take 1 tablet (10 mg total) by mouth daily. 90 tablet 1   temazepam  (RESTORIL ) 30 MG capsule Take 1 capsule (30 mg total) by mouth at bedtime. 30 capsule 2   valsartan  (DIOVAN ) 160 MG tablet Take 1 tablet (160 mg total) by mouth daily. 90 tablet 1   No current facility-administered medications for this visit.     Allergies:   Penicillins, Codeine, Levaquin [levofloxacin in d5w], Lisinopril, Methocarbamol , Oxycodone -acetaminophen , Tramadol , and Oxycodone    Social History:  The patient  reports that she has been smoking cigarettes. She started smoking about 51 years ago. She has a 25.9 pack-year smoking history. She has never used smokeless tobacco. She reports that she does not drink alcohol and does not use drugs.   Family History:   family history includes COPD in her mother; Diabetes in her mother; Heart attack in her brother; Heart failure in her mother; Huntington's disease in her father; Pneumonia in her mother.    Review of Systems: Review of Systems  Constitutional: Negative.   HENT: Negative.    Respiratory: Negative.    Cardiovascular: Negative.   Gastrointestinal: Negative.   Musculoskeletal: Negative.   Neurological: Negative.   Psychiatric/Behavioral: Negative.    All other systems reviewed and are negative.   PHYSICAL EXAM: VS:  BP 122/80 (BP Location: Right Arm, Cuff Size: Normal)    Pulse 84   Ht 5' 4 (1.626 m)   Wt 189 lb (85.7 kg)   SpO2 96%   BMI 32.44 kg/m  , BMI Body mass index is 32.44 kg/m. GEN: Well nourished, well developed, in no acute distress HEENT: normal Neck: no JVD, carotid bruits, or masses Cardiac: RRR; no murmurs, rubs, or gallops,no edema  Respiratory:  clear to auscultation bilaterally, normal work of breathing GI: soft, nontender, nondistended, + BS MS: no deformity or atrophy Skin: warm and dry, no rash Neuro:  Strength and sensation are intact Psych: euthymic mood, full affect  Recent Labs: 04/26/2024: ALT 12; BNP 22.7; BUN 15; Creatinine, Ser 0.70; Hemoglobin 13.9; Platelets 265; Potassium 3.9; Sodium 142   Lipid Panel Lab Results  Component Value Date   CHOL 113 04/26/2024   HDL 45 04/26/2024   LDLCALC 52 04/26/2024   TRIG 76 04/26/2024     Wt Readings from Last 3 Encounters:  05/23/24 189 lb (85.7 kg)  05/03/24 192 lb (87.1 kg)  04/26/24 192 lb 9.6 oz (87.4 kg)     ASSESSMENT AND PLAN:  Problem List Items Addressed This Visit       Cardiology Problems  Essential hypertension - Primary   Relevant Orders   EKG 12-Lead (Completed)   Mixed hyperlipidemia   Relevant Orders   EKG 12-Lead (Completed)   Other Visit Diagnoses       Diastolic dysfunction       Relevant Orders   EKG 12-Lead (Completed)      Shortness of breath Reported having symptoms of fullness in the chest, felt like fluid buildup Details reviewed with her, echo with normal right heart pressures, IVC is not dilated, BNP is normal Grade 1 diastolic dysfunction which is normal for age -Currently feels well, no further workup/testing needed, blood pressure controlled - Recommend she call us  if symptoms recur.  Could have a trial of Lasix as needed for symptoms  Essential hypertension Blood pressure is well controlled on today's visit. No changes made to the medications.  Hyperlipidemia Numbers well-controlled on Crestor  10 daily  Preop  cardiovascular evaluation Reports that she is thinking about hip surgery She would be acceptable risk for hip surgery in the future, no further cardiac testing would be neede    Signed, Velinda Lunger, M.D., Ph.D. [facility] [place], [place] [phone]

## 2024-05-23 NOTE — Patient Instructions (Signed)

## 2024-05-26 ENCOUNTER — Other Ambulatory Visit: Payer: Self-pay | Admitting: Physician Assistant

## 2024-05-26 MED ORDER — HYDROCODONE-ACETAMINOPHEN 5-325 MG PO TABS: 1.0000 | ORAL_TABLET | Freq: Four times a day (QID) | ORAL | 0 refills | Status: DC | PRN

## 2024-05-27 DIAGNOSIS — G4730 Sleep apnea, unspecified: Secondary | ICD-10-CM | POA: Diagnosis not present

## 2024-05-28 ENCOUNTER — Encounter: Payer: Self-pay | Admitting: Nurse Practitioner

## 2024-05-31 DIAGNOSIS — M1611 Unilateral primary osteoarthritis, right hip: Secondary | ICD-10-CM | POA: Diagnosis not present

## 2024-05-31 DIAGNOSIS — R7309 Other abnormal glucose: Secondary | ICD-10-CM | POA: Diagnosis not present

## 2024-05-31 DIAGNOSIS — M7061 Trochanteric bursitis, right hip: Secondary | ICD-10-CM | POA: Diagnosis not present

## 2024-06-01 ENCOUNTER — Encounter: Payer: Self-pay | Admitting: Nurse Practitioner

## 2024-06-05 ENCOUNTER — Encounter: Payer: Self-pay | Admitting: Nurse Practitioner

## 2024-06-05 ENCOUNTER — Ambulatory Visit (INDEPENDENT_AMBULATORY_CARE_PROVIDER_SITE_OTHER): Admitting: Nurse Practitioner

## 2024-06-05 VITALS — BP 99/65 | HR 96 | Temp 98.9°F | Ht 64.0 in | Wt 186.6 lb

## 2024-06-05 DIAGNOSIS — Z716 Tobacco abuse counseling: Secondary | ICD-10-CM | POA: Diagnosis not present

## 2024-06-05 DIAGNOSIS — R7309 Other abnormal glucose: Secondary | ICD-10-CM | POA: Diagnosis not present

## 2024-06-05 DIAGNOSIS — F17210 Nicotine dependence, cigarettes, uncomplicated: Secondary | ICD-10-CM

## 2024-06-05 MED ORDER — VARENICLINE TARTRATE (STARTER) 0.5 MG X 11 & 1 MG X 42 PO TBPK: ORAL_TABLET | ORAL | 0 refills | Status: DC

## 2024-06-05 NOTE — Telephone Encounter (Signed)
 Patient scheduled for in office appointment on10/6/25.

## 2024-06-05 NOTE — Progress Notes (Signed)
 BP 99/65   Pulse 96   Temp 98.9 F (37.2 C) (Oral)   Ht 5' 4 (1.626 m)   Wt 186 lb 9.6 oz (84.6 kg)   SpO2 97%   BMI 32.03 kg/m    Subjective:    Patient ID: Cassandra Brandt, female    DOB: [birth year], 68 y.o.   MRN: [id number]  HPI: Cassandra Brandt is a 68 y.o. female  Chief Complaint  Patient presents with   Surgical Clearance   Nicotine Dependence    Patient states she would like to discuss starting Chantix to help her stop smoking   SMOKING CESSATION Smoking Status: 53 years Smoking Amount: 1/2 ppd Smoking Onset: 68 years old Smoking Quit Date:   Smoking triggers: stress Type of tobacco use: cigarettes Children in the house: no Other household members who smoke: no Treatments attempted: chantix  Pneumovax:  She has quit with chantix in the past.  She was only taking 1 once daily.   Relevant past medical, surgical, family and social history reviewed and updated as indicated. Interim medical history since our last visit reviewed. Allergies and medications reviewed and updated.  Review of Systems  All other systems reviewed and are negative.   Per HPI unless specifically indicated above     Objective:    BP 99/65   Pulse 96   Temp 98.9 F (37.2 C) (Oral)   Ht 5' 4 (1.626 m)   Wt 186 lb 9.6 oz (84.6 kg)   SpO2 97%   BMI 32.03 kg/m   Wt Readings from Last 3 Encounters:  06/05/24 186 lb 9.6 oz (84.6 kg)  05/23/24 189 lb (85.7 kg)  05/03/24 192 lb (87.1 kg)    Physical Exam Vitals and nursing note reviewed.  Constitutional:      General: She is not in acute distress.    Appearance: Normal appearance. She is normal weight. She is not ill-appearing, toxic-appearing or diaphoretic.  HENT:     Head: Normocephalic.     Right Ear: External ear normal.     Left Ear: External ear normal.     Nose: Nose normal.     Mouth/Throat:     Mouth: Mucous membranes are moist.     Pharynx: Oropharynx is clear.  Eyes:     General:        Right eye: No discharge.         Left eye: No discharge.     Extraocular Movements: Extraocular movements intact.     Conjunctiva/sclera: Conjunctivae normal.     Pupils: Pupils are equal, round, and reactive to light.  Cardiovascular:     Rate and Rhythm: Normal rate and regular rhythm.     Heart sounds: No murmur heard. Pulmonary:     Effort: Pulmonary effort is normal. No respiratory distress.     Breath sounds: Normal breath sounds. No wheezing or rales.  Musculoskeletal:     Cervical back: Normal range of motion and neck supple.  Skin:    General: Skin is warm and dry.     Capillary Refill: Capillary refill takes less than 2 seconds.  Neurological:     General: No focal deficit present.     Mental Status: She is alert and oriented to person, place, and time. Mental status is at baseline.  Psychiatric:        Mood and Affect: Mood normal.        Behavior: Behavior normal.        Thought  Content: Thought content normal.        Judgment: Judgment normal.     Results for orders placed or performed during the hospital encounter of 05/19/24  ECHOCARDIOGRAM COMPLETE   Collection Time: 05/19/24  8:57 AM  Result Value Ref Range   S' Lateral 2.20 cm   Area-P 1/2 4.31 cm2   Est EF 60 - 65%       Assessment & Plan:   Problem List Items Addressed This Visit       Other   Encounter for smoking cessation counseling   Discussed cessation and available treatment including nicotine replacement options, pharmacologic treatment, and/or online resources. Based on our discussion, she does plan to initiate treatment today. Plans to initiate tx w/ Varenicline (Chantix). Total time spent on discussion: 7 minutes.         Other Visit Diagnoses       Elevated glucose    -  Primary   A1c and CMP checked at visit today at surgeon's request.   Relevant Orders   HgB A1c   Comp Met (CMET)        Follow up plan: No follow-ups on file.

## 2024-06-05 NOTE — Assessment & Plan Note (Signed)
 Discussed cessation and available treatment including nicotine replacement options, pharmacologic treatment, and/or online resources. Based on our discussion, she does plan to initiate treatment today. Plans to initiate tx w/ Varenicline (Chantix). Total time spent on discussion: 7 minutes.

## 2024-06-06 ENCOUNTER — Telehealth: Payer: Self-pay | Admitting: Nurse Practitioner

## 2024-06-06 ENCOUNTER — Ambulatory Visit: Payer: Self-pay | Admitting: Nurse Practitioner

## 2024-06-06 ENCOUNTER — Encounter: Payer: Self-pay | Admitting: Nurse Practitioner

## 2024-06-06 LAB — COMPREHENSIVE METABOLIC PANEL WITH GFR
ALT: 13 IU/L (ref 0–32)
AST: 16 IU/L (ref 0–40)
Albumin: 4.3 g/dL (ref 3.9–4.9)
Alkaline Phosphatase: 106 IU/L (ref 49–135)
BUN/Creatinine Ratio: 15 (ref 12–28)
BUN: 11 mg/dL (ref 8–27)
Bilirubin Total: 0.2 mg/dL (ref 0.0–1.2)
CO2: 23 mmol/L (ref 20–29)
Calcium: 9.4 mg/dL (ref 8.7–10.3)
Chloride: 104 mmol/L (ref 96–106)
Creatinine, Ser: 0.74 mg/dL (ref 0.57–1.00)
Globulin, Total: 2.3 g/dL (ref 1.5–4.5)
Glucose: 75 mg/dL (ref 70–99)
Potassium: 3.8 mmol/L (ref 3.5–5.2)
Sodium: 143 mmol/L (ref 134–144)
Total Protein: 6.6 g/dL (ref 6.0–8.5)
eGFR: 88 mL/min/1.73 (ref >59)

## 2024-06-06 LAB — HEMOGLOBIN A1C
Est. average glucose Bld gHb Est-mCnc: 114 mg/dL
Hgb A1c MFr Bld: 5.6 % (ref 4.8–5.6)

## 2024-06-06 NOTE — Telephone Encounter (Signed)
 Called and notified patient of providers message. Patient states she cannot use a CPAP because her dog will chew up the tubing. Patient asked if she could take Mounjaro and I explained that insurance would not cover this unless she was diabetic. Patient asked if Georjean would be an option for her.

## 2024-06-06 NOTE — Telephone Encounter (Signed)
 Please let patient know that she has mild sleep apnea.  Please find out if she is okay with starting CPAP.  Unfortunately, only having mild sleep apnea does not qualify her for Zepbound.

## 2024-06-08 NOTE — Telephone Encounter (Signed)
 Cassandra Brandt is not an option for the patient.  She does not meet the requirements.

## 2024-06-12 ENCOUNTER — Ambulatory Visit: Payer: Self-pay | Admitting: Nurse Practitioner

## 2024-06-21 ENCOUNTER — Encounter: Payer: Self-pay | Admitting: Nurse Practitioner

## 2024-06-22 ENCOUNTER — Ambulatory Visit: Admitting: Nurse Practitioner

## 2024-06-23 ENCOUNTER — Other Ambulatory Visit: Payer: Self-pay | Admitting: Orthopedic Surgery

## 2024-06-27 ENCOUNTER — Encounter: Payer: Self-pay | Admitting: Nurse Practitioner

## 2024-06-27 MED ORDER — VARENICLINE TARTRATE 1 MG PO TABS: 1.0000 mg | ORAL_TABLET | Freq: Two times a day (BID) | ORAL | 1 refills | Status: DC

## 2024-06-30 NOTE — Patient Instructions (Addendum)
 Your procedure is scheduled on:    MONDAY   NOVEMBER 17  Report to the Registration Desk on the 1st floor of the [facility] To find out your arrival time, please call [phone] between 1PM - 3PM on:    FRIDAY  NOVEMBER 14  If your arrival time is 6:00 am, do not arrive before that time as the [facility] entrance doors do not open until 6:00 am.  REMEMBER: Instructions that are not followed completely may result in serious medical risk, up to and including death; or upon the discretion of your surgeon and anesthesiologist your surgery may need to be rescheduled.  Do not eat food after midnight the night before surgery.  No gum chewing or hard candies.  You may however, drink CLEAR liquids up to 3 hours before you are scheduled to arrive for your surgery. Do not drink anything within 3 hours of your scheduled arrival time.  Clear liquids include: - water  - apple juice without pulp - gatorade (not RED colors) - black coffee or tea (Do NOT add milk or creamers to the coffee or tea) Do NOT drink anything that is not on this list.  In addition, your doctor has ordered for you to drink the provided:  Ensure Pre-Surgery Clear Carbohydrate Drink   Drinking this carbohydrate drink up to three hours before surgery helps to reduce insulin resistance and improve patient outcomes. Please complete drinking 3 hours before scheduled arrival time.  One week prior to surgery:  STARTING MONDAY NOVEMBER 10  Stop Anti-inflammatories (NSAIDS) such as Advil , Aleve, Ibuprofen , Motrin , Naproxen, Naprosyn and Aspirin based products such as Excedrin, Goody's Powder, BC Powder. Stop ANY OVER THE COUNTER supplements until after surgery.  You may however, continue to take Tylenol  if needed for pain up until the day of surgery.  Continue taking all of your other prescription medications up until the day of surgery.  ON THE DAY OF SURGERY ONLY TAKE THESE MEDICATIONS WITH SIPS OF WATER:  DULoxetine   (CYMBALTA )  pantoprazole  (PROTONIX )  pregabalin  (LYRICA ) amLODipine  (NORVASC )   Use inhalers on the day of surgery and bring to the hospital. albuterol  (VENTOLIN  HFA)  BREZTRI  AEROSPHERE   No Alcohol for 24 hours before or after surgery.  No Smoking including e-cigarettes for 24 hours before surgery.  No chewable tobacco products for at least 6 hours before surgery.    Do not use any recreational drugs for at least a week (preferably 2 weeks) before your surgery.  Please be advised that the combination of cocaine and anesthesia may have negative outcomes, up to and including death. If you test positive for cocaine, your surgery will be cancelled.  On the morning of surgery brush your teeth with toothpaste and water, you may rinse your mouth with mouthwash if you wish. Do not swallow any toothpaste or mouthwash.  Use CHG Soap as directed on instruction sheet.  Do not wear jewelry, make-up, hairpins, clips or nail polish.  For welded (permanent) jewelry: bracelets, anklets, waist bands, etc.  Please have this removed prior to surgery.  If it is not removed, there is a chance that hospital personnel will need to cut it off on the day of surgery.  Do not wear lotions, powders, or perfumes.   Do not shave body hair from the neck down 48 hours before surgery.  Contact lenses, hearing aids and dentures may not be worn into surgery.  Do not bring valuables to the hospital. [facility] is not responsible for  any missing/lost belongings or valuables.   Notify your doctor if there is any change in your medical condition (cold, fever, infection).  Wear comfortable clothing (specific to your surgery type) to the hospital.  After surgery, you can help prevent lung complications by doing breathing exercises.  Take deep breaths and cough every 1-2 hours. Your doctor may order a device called an Incentive Spirometer to help you take deep breaths.  If you are being admitted to the hospital  overnight, leave your suitcase in the car. After surgery it may be brought to your room.  In case of increased patient census, it may be necessary for you, the patient, to continue your postoperative care in the Same Day Surgery department.  If you are being discharged the day of surgery, you will not be allowed to drive home. You will need a responsible individual to drive you home and stay with you for 24 hours after surgery.   If you are taking public transportation, you will need to have a responsible individual with you.  Please call the Pre-admissions Testing Dept. at [phone] if you have any questions about these instructions.  Surgery Visitation Policy:  Patients having surgery or a procedure may have two visitors.  Children under the age of 65 must have an adult with them who is not the patient.  Inpatient Visitation:    Visiting hours are 7 a.m. to 8 p.m. Up to four visitors are allowed at one time in a patient room. The visitors may rotate out with other people during the day.  One visitor age 15 or older may stay with the patient overnight and must be in the room by 8 p.m.   Merchandiser, Retail to address health-related social needs:  [email or web address]     Pre-operative 4 CHG Bath Instructions   You can play a key role in reducing the risk of infection after surgery. Your skin needs to be as free of germs as possible. You can reduce the number of germs on your skin by washing with CHG (chlorhexidine  gluconate) soap before surgery. CHG is an antiseptic soap that kills germs and continues to kill germs even after washing.   DO NOT use if you have an allergy to chlorhexidine /CHG or antibacterial soaps. If your skin becomes reddened or irritated, stop using the CHG and notify one of our RNs at [phone].   Please shower with the CHG soap starting 4 days before surgery using the following schedule:  STARTING THURSDAY NOVEMBER 13      Please keep in mind the following:  DO NOT shave, including legs and underarms, starting the day of your first shower.   You may shave your face at any point before/day of surgery.  Place clean sheets on your bed the day you start using CHG soap. Use a clean washcloth (not used since being washed) for each shower. DO NOT sleep with pets once you start using the CHG.   CHG Shower Instructions:  If you choose to wash your hair and private area, wash first with your normal shampoo/soap.  After you use shampoo/soap, rinse your hair and body thoroughly to remove shampoo/soap residue.  Turn the water OFF and apply about 3 tablespoons (45 ml) of CHG soap to a CLEAN washcloth.  Apply CHG soap ONLY FROM YOUR NECK DOWN TO YOUR TOES (washing for 3-5 minutes)  DO NOT use CHG soap on face, private areas, open wounds, or sores.  Pay special attention to the area where your  surgery is being performed.  If you are having back surgery, having someone wash your back for you may be helpful. Wait 2 minutes after CHG soap is applied, then you may rinse off the CHG soap.  Pat dry with a clean towel  Put on clean clothes/pajamas   If you choose to wear lotion, please use ONLY the CHG-compatible lotions on the back of this paper.     Additional instructions for the day of surgery: DO NOT APPLY any lotions, deodorants, cologne, or perfumes.   Put on clean/comfortable clothes.  Brush your teeth.  Ask your nurse before applying any prescription medications to the skin.      CHG Compatible Lotions   Aveeno Moisturizing lotion  Cetaphil Moisturizing Cream  Cetaphil Moisturizing Lotion  Clairol Herbal Essence Moisturizing Lotion, Dry Skin  Clairol Herbal Essence Moisturizing Lotion, Extra Dry Skin  Clairol Herbal Essence Moisturizing Lotion, Normal Skin  Curel Age Defying Therapeutic Moisturizing Lotion with Alpha Hydroxy  Curel Extreme Care Body Lotion  Curel Soothing Hands Moisturizing Hand  Lotion  Curel Therapeutic Moisturizing Cream, Fragrance-Free  Curel Therapeutic Moisturizing Lotion, Fragrance-Free  Curel Therapeutic Moisturizing Lotion, Original Formula  Eucerin Daily Replenishing Lotion  Eucerin Dry Skin Therapy Plus Alpha Hydroxy Crme  Eucerin Dry Skin Therapy Plus Alpha Hydroxy Lotion  Eucerin Original Crme  Eucerin Original Lotion  Eucerin Plus Crme Eucerin Plus Lotion  Eucerin TriLipid Replenishing Lotion  Keri Anti-Bacterial Hand Lotion  Keri Deep Conditioning Original Lotion Dry Skin Formula Softly Scented  Keri Deep Conditioning Original Lotion, Fragrance Free Sensitive Skin Formula  Keri Lotion Fast Absorbing Fragrance Free Sensitive Skin Formula  Keri Lotion Fast Absorbing Softly Scented Dry Skin Formula  Keri Original Lotion  Keri Skin Renewal Lotion Keri Silky Smooth Lotion  Keri Silky Smooth Sensitive Skin Lotion  Nivea Body Creamy Conditioning Oil  Nivea Body Extra Enriched Lotion  Nivea Body Original Lotion  Nivea Body Sheer Moisturizing Lotion Nivea Crme  Nivea Skin Firming Lotion  NutraDerm 30 Skin Lotion  NutraDerm Skin Lotion  NutraDerm Therapeutic Skin Cream  NutraDerm Therapeutic Skin Lotion  ProShield Protective Hand Cream  Provon moisturizing lotion   How to Use an Incentive Spirometer  An incentive spirometer is a tool that measures how well you are filling your lungs with each breath. Learning to take long, deep breaths using this tool can help you keep your lungs clear and active. This may help to reverse or lessen your chance of developing breathing (pulmonary) problems, especially infection. You may be asked to use a spirometer: After a surgery. If you have a lung problem or a history of smoking. After a long period of time when you have been unable to move or be active. If the spirometer includes an indicator to show the highest number that you have reached, your health care provider or respiratory therapist will help you set  a goal. Keep a log of your progress as told by your health care provider. What are the risks? Breathing too quickly may cause dizziness or cause you to pass out. Take your time so you do not get dizzy or light-headed. If you are in pain, you may need to take pain medicine before doing incentive spirometry. It is harder to take a deep breath if you are having pain. How to use your incentive spirometer  Sit up on the edge of your bed or on a chair. Hold the incentive spirometer so that it is in an upright position. Before you use the  spirometer, breathe out normally. Place the mouthpiece in your mouth. Make sure your lips are closed tightly around it. Breathe in slowly and as deeply as you can through your mouth, causing the piston or the ball to rise toward the top of the chamber. Hold your breath for 3-5 seconds, or for as long as possible. If the spirometer includes a coach indicator, use this to guide you in breathing. Slow down your breathing if the indicator goes above the marked areas. Remove the mouthpiece from your mouth and breathe out normally. The piston or ball will return to the bottom of the chamber. Rest for a few seconds, then repeat the steps 10 or more times. Take your time and take a few normal breaths between deep breaths so that you do not get dizzy or light-headed. Do this every 1-2 hours when you are awake. If the spirometer includes a goal marker to show the highest number you have reached (best effort), use this as a goal to work toward during each repetition. After each set of 10 deep breaths, cough a few times. This will help to make sure that your lungs are clear. If you have an incision on your chest or abdomen from surgery, place a pillow or a rolled-up towel firmly against the incision when you cough. This can help to reduce pain while taking deep breaths and coughing. General tips When you are able to get out of bed: Walk around often. Continue to take deep  breaths and cough in order to clear your lungs. Keep using the incentive spirometer until your health care provider says it is okay to stop using it. If you have been in the hospital, you may be told to keep using the spirometer at home. Contact a health care provider if: You are having difficulty using the spirometer. You have trouble using the spirometer as often as instructed. Your pain medicine is not giving enough relief for you to use the spirometer as told. You have a fever. Get help right away if: You develop shortness of breath. You develop a cough with bloody mucus from the lungs. You have fluid or blood coming from an incision site after you cough. Summary An incentive spirometer is a tool that can help you learn to take long, deep breaths to keep your lungs clear and active. You may be asked to use a spirometer after a surgery, if you have a lung problem or a history of smoking, or if you have been inactive for a long period of time. Use your incentive spirometer as instructed every 1-2 hours while you are awake. If you have an incision on your chest or abdomen, place a pillow or a rolled-up towel firmly against your incision when you cough. This will help to reduce pain. Get help right away if you have shortness of breath, you cough up bloody mucus, or blood comes from your incision when you cough. This information is not intended to replace advice given to you by your health care provider. Make sure you discuss any questions you have with your health care provider. Document Revised: 11/06/2019 Document Reviewed: 11/06/2019 Elsevier Patient Education  2023 Elsevier Inc.         Preoperative Educational Videos for Total Hip, Knee and Shoulder Replacements  To better prepare for surgery, please view our videos that explain the physical activity and discharge planning required to have the best surgical recovery at [facility].  [email or web address]  Questions? Call [phone] or email  [email or web address]

## 2024-07-03 ENCOUNTER — Encounter: Payer: Self-pay | Admitting: Cardiovascular Disease

## 2024-07-03 ENCOUNTER — Encounter
Admission: RE | Admit: 2024-07-03 | Discharge: 2024-07-03 | Disposition: A | Source: Ambulatory Visit | Attending: Orthopedic Surgery

## 2024-07-03 ENCOUNTER — Other Ambulatory Visit: Payer: Self-pay

## 2024-07-03 VITALS — BP 116/66 | HR 97 | Temp 98.2°F | Resp 18 | Ht 64.0 in | Wt 186.0 lb

## 2024-07-03 DIAGNOSIS — Z01812 Encounter for preprocedural laboratory examination: Secondary | ICD-10-CM | POA: Insufficient documentation

## 2024-07-03 LAB — COMPREHENSIVE METABOLIC PANEL WITH GFR
ALT: 19 U/L (ref 0–44)
AST: 21 U/L (ref 15–41)
Albumin: 3.9 g/dL (ref 3.5–5.0)
Alkaline Phosphatase: 77 U/L (ref 38–126)
Anion gap: 12 (ref 5–15)
BUN: 14 mg/dL (ref 8–23)
CO2: 21 mmol/L — ABNORMAL LOW (ref 22–32)
Calcium: 9 mg/dL (ref 8.9–10.3)
Chloride: 104 mmol/L (ref 98–111)
Creatinine, Ser: 0.6 mg/dL (ref 0.44–1.00)
GFR, Estimated: >60 mL/min (ref >60)
Glucose, Bld: 88 mg/dL (ref 70–99)
Potassium: 3.8 mmol/L (ref 3.5–5.1)
Sodium: 137 mmol/L (ref 135–145)
Total Bilirubin: 0.6 mg/dL (ref 0.0–1.2)
Total Protein: 7.5 g/dL (ref 6.5–8.1)

## 2024-07-03 LAB — TYPE AND SCREEN
ABO/RH(D): O POS
Antibody Screen: NEGATIVE

## 2024-07-03 LAB — URINALYSIS, ROUTINE W REFLEX MICROSCOPIC
Bilirubin Urine: NEGATIVE
Glucose, UA: NEGATIVE mg/dL
Hgb urine dipstick: NEGATIVE
Ketones, ur: 5 mg/dL — AB
Nitrite: NEGATIVE
Protein, ur: NEGATIVE mg/dL
Specific Gravity, Urine: 1.023 (ref 1.005–1.030)
pH: 5 (ref 5.0–8.0)

## 2024-07-03 LAB — SURGICAL PCR SCREEN
MRSA, PCR: NEGATIVE
Staphylococcus aureus: NEGATIVE

## 2024-07-04 LAB — CBC
HCT: 46 % (ref 36.0–46.0)
Hemoglobin: 14.4 g/dL (ref 12.0–15.0)
MCH: 29.3 pg (ref 26.0–34.0)
MCHC: 31.3 g/dL (ref 30.0–36.0)
MCV: 93.5 fL (ref 80.0–100.0)
Platelets: 269 K/uL (ref 150–400)
RBC: 4.92 MIL/uL (ref 3.87–5.11)
RDW: 14.5 % (ref 11.5–15.5)
WBC: 9.3 K/uL (ref 4.0–10.5)
nRBC: 0 % (ref 0.0–0.2)

## 2024-07-04 LAB — NICOTINE SCREEN, URINE: Cotinine Ql Scrn, Ur: NEGATIVE ng/mL

## 2024-07-05 ENCOUNTER — Ambulatory Visit: Payer: Self-pay | Admitting: Urgent Care

## 2024-07-05 DIAGNOSIS — B9620 Unspecified Escherichia coli [E. coli] as the cause of diseases classified elsewhere: Secondary | ICD-10-CM

## 2024-07-05 DIAGNOSIS — Z01812 Encounter for preprocedural laboratory examination: Secondary | ICD-10-CM

## 2024-07-06 LAB — URINE CULTURE: Culture: 50000 — AB

## 2024-07-06 MED ORDER — SULFAMETHOXAZOLE-TRIMETHOPRIM 800-160 MG PO TABS: 1.0000 | ORAL_TABLET | Freq: Two times a day (BID) | ORAL | 0 refills | Status: AC

## 2024-07-06 NOTE — Progress Notes (Signed)
 [facility] Perioperative Services: Pre-Admission/Anesthesia Testing  Abnormal Lab Notification and Treatment Plan of Care   Date: 07/06/24  Name: Cassandra Brandt DOB: Sep 06, 1955 MRN:   [id number]  Re: Abnormal labs noted during PAT appointment   Notified:  Provider Name Provider Role Notification Mode  Lorelle Hussar, MD Orthopedics (Surgeon) Routed and/or faxed via [facility]   Abnormal Lab Value(s):   Lab Results  Component Value Date   COLORURINE AMBER (A) 07/03/2024   APPEARANCEUR CLOUDY (A) 07/03/2024   LABSPEC 1.023 07/03/2024   PHURINE 5.0 07/03/2024   GLUCOSEU NEGATIVE 07/03/2024   HGBUR NEGATIVE 07/03/2024   BILIRUBINUR NEGATIVE 07/03/2024   KETONESUR 5 (A) 07/03/2024   PROTEINUR NEGATIVE 07/03/2024   UROBILINOGEN 1.0 03/12/2012   NITRITE NEGATIVE 07/03/2024   LEUKOCYTESUR TRACE (A) 07/03/2024   EPIU 0-5 07/03/2024   WBCU 21-50 07/03/2024   RBCU 0-5 07/03/2024   BACTERIA MANY (A) 07/03/2024   CULT 50,000 COLONIES/mL ESCHERICHIA COLI (A) 07/03/2024   Clinical Information and Notes:  Patient is scheduled for ARTHROPLASTY, HIP, TOTAL,POSTERIOR APPROACH (Right: Hip) on 07/17/2024.    UA performed in PAT consistent with/concerning for infection.  No leukocytosis noted on CBC; WBC 9.3 Renal function: Estimated Creatinine Clearance: 69.8 mL/min (by C-G formula based on SCr of 0.6 mg/dL). Urine C&S added to assess for pathogenically significant growth.  Impression and Plan:  Cassandra Brandt with a UA that was (+) for infection; reflex culture sent. Contacted patient to discuss. Patient reporting that she is not really having any significant other than pain in her lower back. No fever, chills, N/V, or significant abdominal pain. Patient with surgery scheduled soon. In efforts to avoid delaying patient's procedure, or have her experience any potentially significant perioperative complications related to the aforementioned, I would like to proceed with empiric  treatment for urinary tract infection.  Allergies reviewed. Culture report also reviewed to ensure culture appropriate coverage is being provided. Will treat with a 3 day course of SMZ-TMP DS. Patient encouraged to complete the entire course of antibiotics even if she begins to feel better.  Meds ordered this encounter  Medications   sulfamethoxazole -trimethoprim  (BACTRIM  DS) 800-160 MG tablet    Sig: Take 1 tablet by mouth 2 (two) times daily for 3 days. Increase WATER while taking this medication.    Dispense:  6 tablet    Refill:  0    Please contact the patient as soon as it is available for pickup. Rx is for preoperative UTI treatment and needs to be started ASAP.   Patient encouraged to increase her fluid intake as much as possible. Discussed that water is always best to flush the urinary tract. She was advised to avoid caffeine containing fluids until her infections clears, as caffeine can cause her to experience painful bladder spasms.   May use Tylenol  as needed for pain/fever should she experience these symptoms.   Patient instructed to call surgeon's office or PAT with any questions or concerns related to the above outlined course of treatment. Additionally, she was instructed to call if she feels like she is getting worse overall while on treatment. Results and treatment plan of care forwarded to primary attending surgeon to make them aware.   Encounter Diagnoses  Name Primary?   Pre-operative laboratory examination Yes   E. coli UTI (urinary tract infection)    Dorise Pereyra, MSN, APRN, FNP-C, CEN [facility]  Perioperative Services Nurse Practitioner Phone: [phone] Fax: [phone] 07/06/24 8:19 AM  NOTE: This note has been prepared using Scientist, clinical (histocompatibility and immunogenetics). Despite my best ability to proofread, there is always the potential that unintentional transcriptional errors may still occur from this process.

## 2024-07-10 ENCOUNTER — Other Ambulatory Visit: Payer: Self-pay | Admitting: Nurse Practitioner

## 2024-07-11 NOTE — Telephone Encounter (Signed)
 Requested medication (s) are due for refill today- yes  Requested medication (s) are on the active medication list -yes  Future visit scheduled -yes  Last refill: 03/07/24 #180   Notes to clinic: non delegated Rx  Requested Prescriptions  Pending Prescriptions Disp Refills   pregabalin  (LYRICA ) 50 MG capsule [Pharmacy Med Name: PREGABALIN  50 MG CAPSULE] 180 capsule 0    Sig: Take 1 capsule (50 mg total) by mouth 2 (two) times daily.     Not Delegated - Neurology:  Anticonvulsants - Controlled - pregabalin  Failed - 07/11/2024  3:37 PM      Failed - This refill cannot be delegated      Passed - Cr in normal range and within 360 days    Creatinine  Date Value Ref Range Status  09/23/2023 134.4 20.0 - 300.0 mg/dL Final   Creatinine, Ser  Date Value Ref Range Status  07/03/2024 0.60 0.44 - 1.00 mg/dL Final         Passed - Completed PHQ-2 or PHQ-9 in the last 360 days      Passed - Valid encounter within last 12 months    Recent Outpatient Visits           1 month ago Elevated glucose   [place] [facility] Melvin Pao, NP   2 months ago Essential hypertension   [place] [facility] Melvin Pao, NP   4 months ago Depression, recurrent   [place] [facility] [place], Pao, NP   4 months ago Spinal stenosis, lumbar region, with neurogenic claudication   [place] [facility] Melvin Pao, NP   5 months ago Chronic obstructive pulmonary disease, unspecified COPD type (HCC)   [place] [facility] Melvin Pao, NP                 Requested Prescriptions  Pending Prescriptions Disp Refills   pregabalin  (LYRICA ) 50 MG capsule [Pharmacy Med Name: PREGABALIN  50 MG CAPSULE] 180 capsule 0    Sig: Take 1 capsule (50 mg total) by mouth 2 (two) times daily.     Not Delegated - Neurology:  Anticonvulsants - Controlled - pregabalin  Failed - 07/11/2024  3:37 PM      Failed -  This refill cannot be delegated      Passed - Cr in normal range and within 360 days    Creatinine  Date Value Ref Range Status  09/23/2023 134.4 20.0 - 300.0 mg/dL Final   Creatinine, Ser  Date Value Ref Range Status  07/03/2024 0.60 0.44 - 1.00 mg/dL Final         Passed - Completed PHQ-2 or PHQ-9 in the last 360 days      Passed - Valid encounter within last 12 months    Recent Outpatient Visits           1 month ago Elevated glucose   [place] [facility] Melvin Pao, NP   2 months ago Essential hypertension   [place] [facility] Melvin Pao, NP   4 months ago Depression, recurrent   [place] [facility] Melvin Pao, NP   4 months ago Spinal stenosis, lumbar region, with neurogenic claudication   [place] [facility] Melvin Pao, NP   5 months ago Chronic obstructive pulmonary disease, unspecified COPD type [facility])   [place] [facility] Melvin Pao, NP

## 2024-07-17 ENCOUNTER — Other Ambulatory Visit: Payer: Self-pay

## 2024-07-17 ENCOUNTER — Ambulatory Visit: Payer: Self-pay | Admitting: Urgent Care

## 2024-07-17 ENCOUNTER — Encounter: Admission: RE | Disposition: A | Payer: Self-pay | Source: Home / Self Care | Attending: Orthopedic Surgery

## 2024-07-17 ENCOUNTER — Ambulatory Visit
Admission: RE | Admit: 2024-07-17 | Discharge: 2024-07-18 | Disposition: A | Discharge status: Home Health | Attending: Orthopedic Surgery | Admitting: Orthopedic Surgery

## 2024-07-17 ENCOUNTER — Ambulatory Visit

## 2024-07-17 ENCOUNTER — Encounter: Payer: Self-pay | Admitting: Orthopedic Surgery

## 2024-07-17 DIAGNOSIS — M7061 Trochanteric bursitis, right hip: Secondary | ICD-10-CM | POA: Insufficient documentation

## 2024-07-17 DIAGNOSIS — Z7982 Long term (current) use of aspirin: Secondary | ICD-10-CM | POA: Insufficient documentation

## 2024-07-17 DIAGNOSIS — I2510 Atherosclerotic heart disease of native coronary artery without angina pectoris: Secondary | ICD-10-CM | POA: Diagnosis not present

## 2024-07-17 DIAGNOSIS — D509 Iron deficiency anemia, unspecified: Secondary | ICD-10-CM

## 2024-07-17 DIAGNOSIS — K219 Gastro-esophageal reflux disease without esophagitis: Secondary | ICD-10-CM | POA: Insufficient documentation

## 2024-07-17 DIAGNOSIS — Z79899 Other long term (current) drug therapy: Secondary | ICD-10-CM | POA: Diagnosis not present

## 2024-07-17 DIAGNOSIS — I10 Essential (primary) hypertension: Secondary | ICD-10-CM | POA: Insufficient documentation

## 2024-07-17 DIAGNOSIS — M7062 Trochanteric bursitis, left hip: Secondary | ICD-10-CM | POA: Diagnosis present

## 2024-07-17 DIAGNOSIS — J449 Chronic obstructive pulmonary disease, unspecified: Secondary | ICD-10-CM | POA: Diagnosis not present

## 2024-07-17 DIAGNOSIS — Z96641 Presence of right artificial hip joint: Secondary | ICD-10-CM | POA: Diagnosis present

## 2024-07-17 DIAGNOSIS — M1611 Unilateral primary osteoarthritis, right hip: Secondary | ICD-10-CM | POA: Insufficient documentation

## 2024-07-17 DIAGNOSIS — Z87891 Personal history of nicotine dependence: Secondary | ICD-10-CM | POA: Diagnosis not present

## 2024-07-17 DIAGNOSIS — R8290 Unspecified abnormal findings in urine: Secondary | ICD-10-CM

## 2024-07-17 DIAGNOSIS — Z9889 Other specified postprocedural states: Secondary | ICD-10-CM

## 2024-07-17 DIAGNOSIS — R8271 Bacteriuria: Secondary | ICD-10-CM

## 2024-07-17 DIAGNOSIS — Z01812 Encounter for preprocedural laboratory examination: Secondary | ICD-10-CM

## 2024-07-17 DIAGNOSIS — E782 Mixed hyperlipidemia: Secondary | ICD-10-CM

## 2024-07-17 HISTORY — PX: TOTAL HIP ARTHROPLASTY: SHX124

## 2024-07-17 SURGERY — ARTHROPLASTY, HIP, TOTAL,POSTERIOR APPROACH
Anesthesia: General | Site: Hip | Laterality: Right

## 2024-07-17 MED ORDER — DROPERIDOL 2.5 MG/ML IJ SOLN
0.6250 mg | Freq: Once | INTRAMUSCULAR | Status: AC
  Administered 2024-07-17: 0.625 mg via INTRAVENOUS

## 2024-07-17 MED ORDER — PHENYLEPHRINE 80 MCG/ML (10ML) SYRINGE FOR IV PUSH (FOR BLOOD PRESSURE SUPPORT)
PREFILLED_SYRINGE | INTRAVENOUS | Status: DC | PRN
  Administered 2024-07-17 (×2): 160 ug via INTRAVENOUS

## 2024-07-17 MED ORDER — PHENYLEPHRINE HCL-NACL 20-0.9 MG/250ML-% IV SOLN
INTRAVENOUS | Status: DC | PRN
  Administered 2024-07-17: 40 ug/min via INTRAVENOUS

## 2024-07-17 MED ORDER — HYDROCODONE-ACETAMINOPHEN 5-325 MG PO TABS
1.0000 | ORAL_TABLET | ORAL | 0 refills | Status: DC | PRN
  Filled 2024-07-17: qty 40, 4d supply, fill #0

## 2024-07-17 MED ORDER — SODIUM CHLORIDE 0.9 % IV SOLN: INTRAVENOUS | Status: DC

## 2024-07-17 MED ORDER — PHENYLEPHRINE HCL (PRESSORS) 10 MG/ML IV SOLN
INTRAVENOUS | Status: AC
  Filled 2024-07-17: qty 1

## 2024-07-17 MED ORDER — HYDROCODONE-ACETAMINOPHEN 5-325 MG PO TABS
ORAL_TABLET | ORAL | Status: AC
  Filled 2024-07-17: qty 1

## 2024-07-17 MED ORDER — ALBUTEROL SULFATE (2.5 MG/3ML) 0.083% IN NEBU: 3.0000 mL | INHALATION_SOLUTION | Freq: Four times a day (QID) | RESPIRATORY_TRACT | Status: DC | PRN

## 2024-07-17 MED ORDER — PREGABALIN 50 MG PO CAPS
50.0000 mg | ORAL_CAPSULE | Freq: Two times a day (BID) | ORAL | Status: DC
  Administered 2024-07-17 – 2024-07-18 (×2): 50 mg via ORAL
  Filled 2024-07-17 (×2): qty 1

## 2024-07-17 MED ORDER — CELECOXIB 200 MG PO CAPS
200.0000 mg | ORAL_CAPSULE | Freq: Two times a day (BID) | ORAL | 0 refills | Status: AC
  Filled 2024-07-17: qty 28, 14d supply, fill #0

## 2024-07-17 MED ORDER — FENTANYL CITRATE (PF) 100 MCG/2ML IJ SOLN
INTRAMUSCULAR | Status: AC
  Filled 2024-07-17: qty 2

## 2024-07-17 MED ORDER — PROPOFOL 10 MG/ML IV BOLUS
INTRAVENOUS | Status: AC
  Filled 2024-07-17: qty 20

## 2024-07-17 MED ORDER — KETOROLAC TROMETHAMINE 15 MG/ML IJ SOLN
INTRAMUSCULAR | Status: AC
  Filled 2024-07-17: qty 1

## 2024-07-17 MED ORDER — SURGIPHOR WOUND IRRIGATION SYSTEM - OPTIME
TOPICAL | Status: DC | PRN
  Administered 2024-07-17: 450 mL

## 2024-07-17 MED ORDER — DULOXETINE HCL 30 MG PO CPEP
90.0000 mg | ORAL_CAPSULE | Freq: Every day | ORAL | Status: DC
  Administered 2024-07-18: 90 mg via ORAL
  Filled 2024-07-17: qty 3

## 2024-07-17 MED ORDER — SODIUM CHLORIDE 0.9 % IR SOLN
Status: DC | PRN
  Administered 2024-07-17: 1000 mL

## 2024-07-17 MED ORDER — TRANEXAMIC ACID-NACL 1000-0.7 MG/100ML-% IV SOLN
INTRAVENOUS | Status: AC
  Filled 2024-07-17: qty 100

## 2024-07-17 MED ORDER — PHENOL 1.4 % MT LIQD: 1.0000 | OROMUCOSAL | Status: DC | PRN

## 2024-07-17 MED ORDER — DEXAMETHASONE SOD PHOSPHATE PF 10 MG/ML IJ SOLN
8.0000 mg | Freq: Once | INTRAMUSCULAR | Status: AC
  Administered 2024-07-17: 10 mg via INTRAVENOUS

## 2024-07-17 MED ORDER — ONDANSETRON HCL 4 MG/2ML IJ SOLN: 4.0000 mg | Freq: Four times a day (QID) | INTRAMUSCULAR | Status: DC | PRN

## 2024-07-17 MED ORDER — PROPOFOL 10 MG/ML IV BOLUS
INTRAVENOUS | Status: DC | PRN
  Administered 2024-07-17: 90 mg via INTRAVENOUS

## 2024-07-17 MED ORDER — ACETAMINOPHEN 10 MG/ML IV SOLN
INTRAVENOUS | Status: DC | PRN
  Administered 2024-07-17: 1000 mg via INTRAVENOUS

## 2024-07-17 MED ORDER — EPHEDRINE SULFATE-NACL 50-0.9 MG/10ML-% IV SOSY
PREFILLED_SYRINGE | INTRAVENOUS | Status: DC | PRN
  Administered 2024-07-17: 15 mg via INTRAVENOUS

## 2024-07-17 MED ORDER — VARENICLINE TARTRATE 0.5 MG PO TABS
1.0000 mg | ORAL_TABLET | Freq: Two times a day (BID) | ORAL | Status: DC
  Administered 2024-07-17 (×2): 1 mg via ORAL
  Filled 2024-07-17 (×4): qty 2

## 2024-07-17 MED ORDER — ACETAMINOPHEN 10 MG/ML IV SOLN
INTRAVENOUS | Status: AC
  Filled 2024-07-17: qty 100

## 2024-07-17 MED ORDER — MIDAZOLAM HCL (PF) 2 MG/2ML IJ SOLN
INTRAMUSCULAR | Status: DC | PRN
  Administered 2024-07-17: 2 mg via INTRAVENOUS

## 2024-07-17 MED ORDER — AMLODIPINE BESYLATE 10 MG PO TABS
5.0000 mg | ORAL_TABLET | Freq: Every day | ORAL | Status: DC
  Filled 2024-07-17: qty 1

## 2024-07-17 MED ORDER — ONDANSETRON HCL 4 MG PO TABS
4.0000 mg | ORAL_TABLET | Freq: Four times a day (QID) | ORAL | Status: DC | PRN
  Administered 2024-07-17: 4 mg via ORAL

## 2024-07-17 MED ORDER — ENOXAPARIN SODIUM 40 MG/0.4ML IJ SOSY
40.0000 mg | PREFILLED_SYRINGE | INTRAMUSCULAR | 0 refills | Status: DC
  Filled 2024-07-17: qty 5.6, 14d supply, fill #0

## 2024-07-17 MED ORDER — ACETAMINOPHEN 500 MG PO TABS
1000.0000 mg | ORAL_TABLET | Freq: Three times a day (TID) | ORAL | Status: DC
  Administered 2024-07-18: 1000 mg via ORAL
  Filled 2024-07-17: qty 2

## 2024-07-17 MED ORDER — IRBESARTAN 150 MG PO TABS
150.0000 mg | ORAL_TABLET | Freq: Every day | ORAL | Status: DC
  Filled 2024-07-17: qty 1

## 2024-07-17 MED ORDER — ENOXAPARIN SODIUM 40 MG/0.4ML IJ SOSY
40.0000 mg | PREFILLED_SYRINGE | INTRAMUSCULAR | Status: DC
  Administered 2024-07-18: 40 mg via SUBCUTANEOUS
  Filled 2024-07-17: qty 0.4

## 2024-07-17 MED ORDER — SODIUM CHLORIDE 0.9 % IV SOLN
INTRAVENOUS | Status: DC | PRN
  Administered 2024-07-17: 30 mL

## 2024-07-17 MED ORDER — TRANEXAMIC ACID-NACL 1000-0.7 MG/100ML-% IV SOLN
1000.0000 mg | INTRAVENOUS | Status: AC
  Administered 2024-07-17 (×2): 1000 mg via INTRAVENOUS

## 2024-07-17 MED ORDER — BUPIVACAINE-EPINEPHRINE (PF) 0.25% -1:200000 IJ SOLN
INTRAMUSCULAR | Status: DC | PRN
  Administered 2024-07-17: 20 mL

## 2024-07-17 MED ORDER — ONDANSETRON 4 MG PO TBDP
ORAL_TABLET | ORAL | Status: AC
Start: 2024-07-17 — End: 2024-07-17
  Filled 2024-07-17: qty 1

## 2024-07-17 MED ORDER — ASPIRIN 81 MG PO TBEC
81.0000 mg | DELAYED_RELEASE_TABLET | Freq: Every day | ORAL | Status: DC
  Filled 2024-07-17: qty 1

## 2024-07-17 MED ORDER — CHLORHEXIDINE GLUCONATE 0.12 % MT SOLN
15.0000 mL | Freq: Once | OROMUCOSAL | Status: AC
  Administered 2024-07-17: 15 mL via OROMUCOSAL

## 2024-07-17 MED ORDER — TEMAZEPAM 15 MG PO CAPS
15.0000 mg | ORAL_CAPSULE | Freq: Every evening | ORAL | Status: DC | PRN
  Administered 2024-07-17: 15 mg via ORAL
  Filled 2024-07-17 (×2): qty 1

## 2024-07-17 MED ORDER — FENTANYL CITRATE (PF) 100 MCG/2ML IJ SOLN
INTRAMUSCULAR | Status: DC | PRN
  Administered 2024-07-17 (×2): 50 ug via INTRAVENOUS

## 2024-07-17 MED ORDER — ROSUVASTATIN CALCIUM 10 MG PO TABS
10.0000 mg | ORAL_TABLET | Freq: Every day | ORAL | Status: DC
  Administered 2024-07-17 – 2024-07-18 (×2): 10 mg via ORAL
  Filled 2024-07-17 (×2): qty 1

## 2024-07-17 MED ORDER — ONDANSETRON HCL 4 MG/2ML IJ SOLN
INTRAMUSCULAR | Status: DC | PRN
  Administered 2024-07-17: 4 mg via INTRAVENOUS

## 2024-07-17 MED ORDER — ORAL CARE MOUTH RINSE: 15.0000 mL | Freq: Once | OROMUCOSAL | Status: AC

## 2024-07-17 MED ORDER — ONDANSETRON HCL 4 MG PO TABS
ORAL_TABLET | ORAL | Status: AC
  Filled 2024-07-17: qty 1

## 2024-07-17 MED ORDER — PANTOPRAZOLE SODIUM 40 MG PO TBEC
40.0000 mg | DELAYED_RELEASE_TABLET | Freq: Every day | ORAL | Status: DC
  Administered 2024-07-18: 40 mg via ORAL
  Filled 2024-07-17 (×2): qty 1

## 2024-07-17 MED ORDER — METOCLOPRAMIDE HCL 10 MG PO TABS: 5.0000 mg | ORAL_TABLET | Freq: Three times a day (TID) | ORAL | Status: DC | PRN

## 2024-07-17 MED ORDER — EPHEDRINE 5 MG/ML INJ
INTRAVENOUS | Status: AC
Start: 2024-07-17 — End: 2024-07-17
  Filled 2024-07-17: qty 5

## 2024-07-17 MED ORDER — BUDESON-GLYCOPYRROL-FORMOTEROL 160-9-4.8 MCG/ACT IN AERO
2.0000 | INHALATION_SPRAY | Freq: Two times a day (BID) | RESPIRATORY_TRACT | Status: DC
  Administered 2024-07-17 – 2024-07-18 (×2): 2 via RESPIRATORY_TRACT
  Filled 2024-07-17: qty 5.9

## 2024-07-17 MED ORDER — KETAMINE HCL 50 MG/5ML IJ SOSY
PREFILLED_SYRINGE | INTRAMUSCULAR | Status: AC
  Filled 2024-07-17: qty 5

## 2024-07-17 MED ORDER — CHLORHEXIDINE GLUCONATE 0.12 % MT SOLN
OROMUCOSAL | Status: AC
  Filled 2024-07-17: qty 15

## 2024-07-17 MED ORDER — ROCURONIUM BROMIDE 100 MG/10ML IV SOLN
INTRAVENOUS | Status: DC | PRN
  Administered 2024-07-17: 10 mg via INTRAVENOUS
  Administered 2024-07-17: 60 mg via INTRAVENOUS
  Administered 2024-07-17: 20 mg via INTRAVENOUS

## 2024-07-17 MED ORDER — CEFAZOLIN SODIUM-DEXTROSE 2-4 GM/100ML-% IV SOLN
2.0000 g | Freq: Four times a day (QID) | INTRAVENOUS | Status: AC
  Administered 2024-07-17 – 2024-07-18 (×2): 2 g via INTRAVENOUS
  Filled 2024-07-17 (×2): qty 100

## 2024-07-17 MED ORDER — CEFAZOLIN SODIUM-DEXTROSE 2-4 GM/100ML-% IV SOLN
2.0000 g | INTRAVENOUS | Status: AC
  Administered 2024-07-17: 2 g via INTRAVENOUS

## 2024-07-17 MED ORDER — EPHEDRINE 5 MG/ML INJ
INTRAVENOUS | Status: AC
  Filled 2024-07-17: qty 5

## 2024-07-17 MED ORDER — PHENYLEPHRINE 80 MCG/ML (10ML) SYRINGE FOR IV PUSH (FOR BLOOD PRESSURE SUPPORT)
PREFILLED_SYRINGE | INTRAVENOUS | Status: AC
  Filled 2024-07-17: qty 10

## 2024-07-17 MED ORDER — MENTHOL 3 MG MT LOZG: 1.0000 | LOZENGE | OROMUCOSAL | Status: DC | PRN

## 2024-07-17 MED ORDER — LIDOCAINE HCL (CARDIAC) PF 100 MG/5ML IV SOSY
PREFILLED_SYRINGE | INTRAVENOUS | Status: DC | PRN
  Administered 2024-07-17: 100 mg via INTRAVENOUS

## 2024-07-17 MED ORDER — DROPERIDOL 2.5 MG/ML IJ SOLN
INTRAMUSCULAR | Status: AC
  Filled 2024-07-17: qty 2

## 2024-07-17 MED ORDER — DIPHENHYDRAMINE HCL 12.5 MG/5ML PO ELIX: 12.5000 mg | ORAL_SOLUTION | ORAL | Status: DC | PRN

## 2024-07-17 MED ORDER — BUPIVACAINE-EPINEPHRINE (PF) 0.25% -1:200000 IJ SOLN
INTRAMUSCULAR | Status: AC
  Filled 2024-07-17: qty 30

## 2024-07-17 MED ORDER — SODIUM CHLORIDE (PF) 0.9 % IJ SOLN
INTRAMUSCULAR | Status: AC
  Filled 2024-07-17: qty 10

## 2024-07-17 MED ORDER — DOCUSATE SODIUM 100 MG PO CAPS
100.0000 mg | ORAL_CAPSULE | Freq: Two times a day (BID) | ORAL | 0 refills | Status: DC
  Filled 2024-07-17: qty 10, 5d supply, fill #0

## 2024-07-17 MED ORDER — KETOROLAC TROMETHAMINE 15 MG/ML IJ SOLN
7.5000 mg | Freq: Four times a day (QID) | INTRAMUSCULAR | Status: DC
  Administered 2024-07-17 – 2024-07-18 (×3): 7.5 mg via INTRAVENOUS
  Filled 2024-07-17 (×2): qty 1

## 2024-07-17 MED ORDER — LIDOCAINE HCL (PF) 2 % IJ SOLN
INTRAMUSCULAR | Status: AC
  Filled 2024-07-17: qty 5

## 2024-07-17 MED ORDER — DOCUSATE SODIUM 100 MG PO CAPS
100.0000 mg | ORAL_CAPSULE | Freq: Two times a day (BID) | ORAL | Status: DC
  Administered 2024-07-17 – 2024-07-18 (×3): 100 mg via ORAL
  Filled 2024-07-17 (×3): qty 1

## 2024-07-17 MED ORDER — METOCLOPRAMIDE HCL 5 MG/ML IJ SOLN: 5.0000 mg | Freq: Three times a day (TID) | INTRAMUSCULAR | Status: DC | PRN

## 2024-07-17 MED ORDER — ACETAMINOPHEN 325 MG PO TABS: 325.0000 mg | ORAL_TABLET | Freq: Four times a day (QID) | ORAL | Status: DC | PRN

## 2024-07-17 MED ORDER — MORPHINE SULFATE (PF) 2 MG/ML IV SOLN: 0.5000 mg | INTRAVENOUS | Status: DC | PRN

## 2024-07-17 MED ORDER — ONDANSETRON HCL 4 MG PO TABS
4.0000 mg | ORAL_TABLET | Freq: Four times a day (QID) | ORAL | 0 refills | Status: DC | PRN
  Filled 2024-07-17: qty 10, 3d supply, fill #0

## 2024-07-17 MED ORDER — CEFAZOLIN SODIUM-DEXTROSE 2-4 GM/100ML-% IV SOLN
INTRAVENOUS | Status: AC
  Filled 2024-07-17: qty 100

## 2024-07-17 MED ORDER — KETAMINE HCL 50 MG/5ML IJ SOSY
PREFILLED_SYRINGE | INTRAMUSCULAR | Status: DC | PRN
  Administered 2024-07-17: 30 mg via INTRAVENOUS

## 2024-07-17 MED ORDER — ROCURONIUM BROMIDE 10 MG/ML (PF) SYRINGE
PREFILLED_SYRINGE | INTRAVENOUS | Status: AC
  Filled 2024-07-17: qty 10

## 2024-07-17 MED ORDER — FENTANYL CITRATE (PF) 100 MCG/2ML IJ SOLN
25.0000 ug | INTRAMUSCULAR | Status: DC | PRN
  Administered 2024-07-17 (×2): 25 ug via INTRAVENOUS

## 2024-07-17 MED ORDER — HYDROCODONE-ACETAMINOPHEN 5-325 MG PO TABS
1.0000 | ORAL_TABLET | ORAL | Status: DC | PRN
  Administered 2024-07-17 (×2): 1 via ORAL
  Administered 2024-07-18: 2 via ORAL
  Administered 2024-07-18: 1 via ORAL
  Filled 2024-07-17: qty 2
  Filled 2024-07-17 (×2): qty 1

## 2024-07-17 MED ORDER — LACTATED RINGERS IV SOLN: INTRAVENOUS | Status: DC

## 2024-07-17 MED ORDER — PROPOFOL 1000 MG/100ML IV EMUL
INTRAVENOUS | Status: AC
  Filled 2024-07-17: qty 100

## 2024-07-17 MED ORDER — GLYCOPYRROLATE 0.2 MG/ML IJ SOLN
INTRAMUSCULAR | Status: DC | PRN
  Administered 2024-07-17: .1 mg via INTRAVENOUS

## 2024-07-17 MED ORDER — SUGAMMADEX SODIUM 200 MG/2ML IV SOLN
INTRAVENOUS | Status: DC | PRN
  Administered 2024-07-17: 180 mg via INTRAVENOUS

## 2024-07-17 MED ORDER — MIDAZOLAM HCL 2 MG/2ML IJ SOLN
INTRAMUSCULAR | Status: AC
  Filled 2024-07-17: qty 2

## 2024-07-17 SURGICAL SUPPLY — 73 items
BLADE SAGITTAL AGGR TOOTH XLG (BLADE) ×1 IMPLANT
BNDG COHESIVE 6X5 TAN ST LF (GAUZE/BANDAGES/DRESSINGS) ×1 IMPLANT
BOWL CEMENT MIX W/ADAPTER (MISCELLANEOUS) IMPLANT
BOWL CEMENT MIXING ADV NOZZLE (MISCELLANEOUS) IMPLANT
BRUSH SCRUB EZ PLAIN DRY (MISCELLANEOUS) ×1 IMPLANT
CEMENT BONE 10-PACK (Cement) IMPLANT
CHLORAPREP W/TINT 26 (MISCELLANEOUS) ×2 IMPLANT
COVER SET STULBERG POSITIONER (MISCELLANEOUS) ×1 IMPLANT
CUP MEDICINE 2OZ PLAST GRAD ST (MISCELLANEOUS) IMPLANT
DERMABOND ADVANCED .7 DNX12 (GAUZE/BANDAGES/DRESSINGS) ×1 IMPLANT
DRAPE IMP U-DRAPE 54X76 (DRAPES) ×1 IMPLANT
DRAPE INCISE IOBAN 66X60 STRL (DRAPES) ×1 IMPLANT
DRAPE POUCH INSTRU U-SHP 10X18 (DRAPES) ×1 IMPLANT
DRAPE SHEET LG 3/4 BI-LAMINATE (DRAPES) ×1 IMPLANT
DRAPE TABLE BACK 80X90 (DRAPES) ×1 IMPLANT
DRAPE U-SHAPE 47X51 STRL (DRAPES) ×1 IMPLANT
DRSG MEPILEX SACRM 8.7X9.8 (GAUZE/BANDAGES/DRESSINGS) ×1 IMPLANT
DRSG OPSITE POSTOP 4X10 (GAUZE/BANDAGES/DRESSINGS) IMPLANT
DRSG OPSITE POSTOP 4X12 (GAUZE/BANDAGES/DRESSINGS) IMPLANT
DRSG OPSITE POSTOP 4X8 (GAUZE/BANDAGES/DRESSINGS) IMPLANT
ELECTRODE REM PT RTRN 9FT ADLT (ELECTROSURGICAL) ×1 IMPLANT
GLOVE BIO SURGEON STRL SZ8 (GLOVE) ×1 IMPLANT
GLOVE BIOGEL PI IND STRL 8 (GLOVE) ×1 IMPLANT
GLOVE PI ORTHO PRO STRL 7.5 (GLOVE) ×2 IMPLANT
GLOVE PI ORTHO PRO STRL SZ8 (GLOVE) ×2 IMPLANT
GLOVE SURG SYN 7.5 PF PI (GLOVE) ×2 IMPLANT
GOWN SRG XL LONG LVL 3 NONREIN (GOWNS) ×1 IMPLANT
GOWN SRG XL LVL 3 NONREINFORCE (GOWNS) ×1 IMPLANT
GOWN STRL REUS W/ TWL LRG LVL3 (GOWN DISPOSABLE) ×1 IMPLANT
HANDLE YANKAUER SUCT OPEN TIP (MISCELLANEOUS) ×1 IMPLANT
HEAD HIP LFIT V40 22 (Head) IMPLANT
HOOD PEEL AWAY T7 (MISCELLANEOUS) ×2 IMPLANT
INSERT MDM LINER 22.2X38 38D (Insert) IMPLANT
IV NS 100ML SINGLE PACK (IV SOLUTION) ×1 IMPLANT
KIT PREP HIP W/CEMENT RESTRICT (Miscellaneous) IMPLANT
KIT TURNOVER KIT A (KITS) ×1 IMPLANT
LINER CEMENTLESS SZ D 38 HIP (Liner) IMPLANT
MANIFOLD NEPTUNE II (INSTRUMENTS) ×1 IMPLANT
MARKER SKIN DUAL TIP RULER LAB (MISCELLANEOUS) ×1 IMPLANT
MAT ABSORB FLUID 56X50 GRAY (MISCELLANEOUS) ×1 IMPLANT
NDL FILTER BLUNT 18X1 1/2 (NEEDLE) ×1 IMPLANT
NDL SAFETY ECLIP 18X1.5 (MISCELLANEOUS) ×1 IMPLANT
NDL SPNL 20GX3.5 QUINCKE YW (NEEDLE) ×1 IMPLANT
NEEDLE FILTER BLUNT 18X1 1/2 (NEEDLE) ×1 IMPLANT
NEEDLE SPNL 20GX3.5 QUINCKE YW (NEEDLE) ×1 IMPLANT
PACK HIP PROSTHESIS (MISCELLANEOUS) ×1 IMPLANT
PAD ARMBOARD POSITIONER FOAM (MISCELLANEOUS) ×2 IMPLANT
PENCIL SMOKE EVACUATOR (MISCELLANEOUS) ×1 IMPLANT
PILLOW ABDUCTION FOAM SM (MISCELLANEOUS) ×1 IMPLANT
RETRIEVER SUT HEWSON (MISCELLANEOUS) ×1 IMPLANT
SCREW HEX LP 6.5X15 (Screw) IMPLANT
SCREW HEX LP 6.5X20 (Screw) IMPLANT
SHELL TRIDENT II CLUST 50 (Shell) IMPLANT
SLEEVE SCD COMPRESS KNEE MED (STOCKING) ×1 IMPLANT
SOL .9 NS 3000ML IRR UROMATIC (IV SOLUTION) ×1 IMPLANT
SOLN STERILE WATER BTL 1000 ML (IV SOLUTION) ×1 IMPLANT
SOLUTION IRRIG SURGIPHOR (IV SOLUTION) ×1 IMPLANT
SPACER CENTRAL ACCOLADE 4/5X14 (Spacer) IMPLANT
STEM HIP ACCOLADE 5X37 132D (Stem) IMPLANT
SUT BONE WAX W31G (SUTURE) ×1 IMPLANT
SUT ETHIBOND #5 BRAIDED 30INL (SUTURE) ×1 IMPLANT
SUT STRATAFIX 14 PDO 36 VLT (SUTURE) ×1 IMPLANT
SUT VIC AB 0 CT1 36 (SUTURE) ×1 IMPLANT
SUT VIC AB 2-0 CT2 27 (SUTURE) ×2 IMPLANT
SUTURE STRATA SPIR 4-0 18 (SUTURE) ×1 IMPLANT
SUTURE VICRYL 1-0 27IN ABS (SUTURE) ×1 IMPLANT
SYR 20ML LL LF (SYRINGE) ×2 IMPLANT
SYR TB 1ML LL NO SAFETY (SYRINGE) ×1 IMPLANT
TIP FAN IRRIG PULSAVAC PLUS (DISPOSABLE) ×1 IMPLANT
TIP IRRIG/SUCT HIGH CAPACITY (MISCELLANEOUS) IMPLANT
TOWEL OR 17X26 4PK STRL BLUE (TOWEL DISPOSABLE) IMPLANT
TRAP FLUID SMOKE EVACUATOR (MISCELLANEOUS) ×1 IMPLANT
WAND WEREWOLF FASTSEAL 6.0 (MISCELLANEOUS) ×1 IMPLANT

## 2024-07-17 NOTE — H&P (Signed)
 History of Present Illness: Cassandra Brandt is an 68 y.o. female who presents for history and physical for right posterior total hip arthroplasty with Dr. Lorelle on 07/17/2024. Patient has advanced right hip degenerative arthritis. She has had increasing pain over the last year that is interfering with her quality of life and activities daily living. She has severe pain in her buttocks lateral thigh and groin. Pain is sharp and increased with weightbearing activities. Her pain prevents her from walking. She is currently walking with a limp. She has seen Dr. Lorelle and discussed right posterior total hip arthroplasty. Patient has agreed and consented the procedure. Patient has a history of osteopenia so therefore we will plan a posterior approach  Patient with a history of smoking but has quit and has been very successful with this. No intention of having anymore cigarettes. She is a BMI of 32 and is a nondiabetic.  Past Medical History: Past Medical History:  Diagnosis Date  Anemia 03/03/2007  Chronic obstructive pulmonary disease, unspecified (CMS/HHS-HCC) 01/30/2021  Depression  Essential hypertension 01/30/2021  GERD (gastroesophageal reflux disease)  Osteoarthritis of hip 01/30/2021   Past Surgical History: Past Surgical History:  Procedure Laterality Date  CESAREAN SECTION 06/05/1978  ROUX-EN-Y PROCEDURE  TONSILLECTOMY 2015   Past Family History: Family History  Problem Relation Age of Onset  Anxiety Mother  Depression Mother  Diabetes type II Mother  Diabetes type II Sister  Diabetes type II Brother  High blood pressure (Hypertension) Brother   Medications: Current Outpatient Medications  Medication Sig Dispense Refill  albuterol  MDI, PROVENTIL , VENTOLIN , PROAIR , HFA 90 mcg/actuation inhaler Inhale 2 inhalations into the lungs every 6 (six) hours as needed for Wheezing  amLODIPine  (NORVASC ) 5 MG tablet Take 5 mg by mouth once daily  aspirin 81 MG EC tablet Take 81 mg by mouth once  daily  budesonide-glycopyrrolate-formoterol (BREZTRI  AEROSPHERE) 160-9-4.8 mcg/actuation inhaler Inhale 2 inhalations into the lungs 2 (two) times daily  clotrimazole  (LOTRIMIN ) 1 % cream Apply topically 2 (two) times daily  DULoxetine  (CYMBALTA ) 30 MG DR capsule Take 30 mg by mouth 2 (two) times daily  pantoprazole  (PROTONIX ) 40 MG DR tablet Take 1 tablet by mouth 2 (two) times daily  pregabalin  (LYRICA ) 50 MG capsule Take 50 mg by mouth 2 (two) times daily  rosuvastatin  (CRESTOR ) 10 MG tablet Take 10 mg by mouth once daily  temazepam  (RESTORIL ) 30 mg capsule Take 30 mg by mouth at bedtime as needed for Sleep  valsartan  (DIOVAN ) 160 MG tablet Take 160 mg by mouth once daily   No current facility-administered medications for this visit.   Allergies: Allergies  Allergen Reactions  Codeine Nausea  Levaquin [Levofloxacin] Hives and Other (See Comments)  thrush  Lisinopril Cough  Methocarbamol  Nausea And Vomiting  Oxycodone -Acetaminophen  Nausea  Penicillins Hives  Tramadol  Nausea    Visit Vitals: Vitals:  07/11/24 1043  BP: 105/65    Review of Systems:  A comprehensive 14 point ROS was performed, reviewed, and the pertinent orthopaedic findings are documented in the HPI.  Physical Exam: Body mass index is 32.79 kg/m. General:  Well developed, well nourished, no apparent distress, normal affect, antalgic gait with no assistive devices.  HEENT: Head normocephalic, atraumatic, PERRL.   Abdomen: Soft, non tender, non distended, Bowel sounds present.  Heart: Examination of the heart reveals regular, rate, and rhythm. There is no murmur noted on ascultation. There is a normal apical pulse.  Lungs: Lungs are clear to auscultation. There is no wheeze, rhonchi, or crackles. There  is normal expansion of bilateral chest walls.   Right hip exam  SKIN: intact SWELLING: none WARMTH: no warmth TENDERNESS: Mild tenderness over the greater trochanter, Stinchfield Positive ROM: 0  degrees internal rotation and 30 degrees external rotation and pain with internal rotation,; Hip Flexion 90 STRENGTH: limited by pain GAIT: stiff-legged STABILITY: stable to testing CREPITUS: no LEG LENGTH DISCREPANCY: none NEUROLOGICAL EXAM: normal VASCULAR EXAM: normal LUMBAR SPINE: tenderness: yes paraspinal on the right straight leg raising sign: no motor exam: normal  Hip Imaging :  Right hip x-rays reviewed by me today show moderate to severe degenerative changes of the right hip with femoral head deformity joint space narrowing with bone-on-bone articulation sclerosis and osteophyte formation. The left hip shows moderate degenerative changes with medial and inferior joint space narrowing, sclerosis and early osteophyte formation. No fractures or dislocations noted.  Assessment:  Right hip osteoarthritis Right hip bursitis  Plan: Cassandra Brandt is a 68 year old female presents with severe right hip arthritis. She has had severe debilitating pain that has been increasing to the point that is interfering with her quality of life and activities day living. Her pain is located in her groin thigh lateral hip and is sharp 10 out of 10. She has advanced degenerative changes on x-ray. She is seeing Dr. Lorelle and discussed total hip arthroplasty and agreed and consented to right posterior approach due to her osteopenia. Risks, benefits, complications of a right posterior total hip arthroplasty with possible trochanteric bursectomy have been discussed with the patient. Patient has agreed and consented procedure with Dr. Lorelle on 07/17/2024.  The hospitalization and post-operative care and rehabilitation were also discussed. The use of perioperative antibiotics and DVT prophylaxis were discussed. The risk, benefits and alternatives to a surgical intervention were discussed at length with the patient. The patient was also advised of risks related to the medical comorbidities and elevated body mass index  (BMI). A lengthy discussion took place to review the most common complications including but not limited to: deep vein thrombosis, pulmonary embolus, heart attack, stroke, infection, wound breakdown, heterotopic ossification, dislocation, numbness, leg length in-equality, intraoperative fracture, damage to nerves, tendon,muscles, arteries or other blood vessels, death and other possible complications from anesthesia. The patient was told that we will take steps to minimize these risks by using sterile technique, antibiotics and DVT prophylaxis when appropriate and follow the patient postoperatively in the office setting to monitor progress. The possibility of recurrent pain, no improvement in pain and actual worsening of pain were also discussed with the patient. The risk of dislocation following total hip replacement was discussed and potential precautions to prevent dislocation were reviewed.   All questions answered patient agrees with above plan for right total hip arthroplasty with bursectomy.

## 2024-07-17 NOTE — Interval H&P Note (Signed)
 Patient history and physical updated. Consent reviewed including risks, benefits, and alternatives to surgery. Patient agrees with above plan to proceed with right posterior total hip arthroplasty. We also specifically discussed the use and risks of cement during the procedure.

## 2024-07-17 NOTE — Op Note (Signed)
 Patient Name: Cassandra Brandt, Cassandra Brandt  FMW:[id number]  Pre-Operative Diagnosis: Right hip Osteoarthritis and greater trochanter bursitis  Post-Operative Diagnosis: (same)  Procedure: Right Total Hip Arthroplasty with greater trochanteric bursectomy  Components/Implants: Cup: Trident Tritanium clusterhole 50/D w/x2 screws    Liner: MDM 38/D  Stem: Accolade C 132 deg # 5 w/ distal spacer  Head:LFIT 22.2 +0, MDM X3 22.2/38  Date of Surgery: 07/17/2024  Surgeon: Arthea Sheer MD  Assistant: Mitzie RNFA (present and scrubbed throughout the case, critical for assistance with exposure, retraction, instrumentation, and closure)   Anesthesiologist: Piscitello  Anesthesia:General  IVF:700cc  EBL: 200cc  Complications: None   Brief history: The patient is a 68 year old female with a history of osteoarthritis of the right hip with pain limiting their range of motion and activities of daily living, which has failed multiple attempts at conservative therapy.  The risks and benefits of total hip arthroplasty as definitive surgical treatment were discussed with the patient, who opted to proceed with the operation.  After outpatient medical clearance and optimization was completed the patient was admitted to [facility] for the procedure.  All preoperative films were reviewed and an appropriate surgical plan was made prior to surgery.   Description of procedure: The patient was brought to the operating room where laterality was confirmed by all those present to be the right side.  The patient was moved to the table and administered general anesthesia. Patient was given an intravenous dose of antibiotics for surgical prophylaxis and TXA . The patient was positioned in lateral decubitus position with all bony prominences well-padded.  Surgical site was prepped with alcohol and chlorhexidine .  Surgical site over the hip was draped in typical sterile fashion with multiple layers of adhesive and  nonadhesive drapes.  The incision site over the greater trochanter posteriorly was marked out with a sterile marker.   Surgical timeout was then called with participation of all staff in the room the patient was confirmed and laterality again confirmed.  An incision was made over the lateral aspect of the hip cheating posteriorly on the proximal aspect.  Careful soft tissue dissection and coagulation of all bleeders was carried out down to the level of the glut max fascia.  The fascia was carefully incised in line with the femur.  A Charnley retractor was placed deep to the fascia with care taken to ensure that there was no nerve entrapment in the retractor.  There was a significant mount of bursal tissue encountered at this level with inflammation and quite a thick coating over the lateral femur.  This bursal tissue was carefully removed with electrocautery.  A dull Cobra retractor was placed under the abductor mechanism to protect the mechanism and fully expose the piriformis and short external rotators.  The external rotators were carefully detached from the femur with electrocautery and tagged with Ethibond sutures.  The capsule to the hip was incised and tagged with sutures.  The labrum was visualized at this time and found to be torn with complex tearing at the superior lateral corner.  There were large osteophytes along the rim of the femoral head and down the neck.  The femur was then carefully dislocated and cobra retractors were placed superior and inferior to the femoral neck.  The x-ray template was reviewed and electrocautery was used to mark out the femoral neck resection.   After the femoral neck and head were resected attention was then turned to the acetabulum.  An anterior acetabular retractor was placed and  posterior retractor was placed to fully visualize the acetabulum.  The labrum was removed with electrocautery and the pulnivar was removed.  The acetabulum was then reamed sequentially up  to a size 50 cup which allowed for good bony coverage and stability.  The size 50 acetabular component was then opened and implanted.  A drill was used to carefully place the 2 screws into the acetabular component.  Acetabular component was irrigated and the locking ring was checked for debris.  A real MDM liner was impacted and checked for stability at this time.  Attention was turned back to the femur and a femoral neck retractor was placed.  The femur was opened with a box osteotome and canal finder.  The femur was then sequentially broached up to a size 5 broach which allowed for good fit and fill.  A calcar planer was then used to smooth out the calcar.  The bone was found to be significantly osteoporotic and was prepared at this time for real implantation.  A distal spacer was placed and anesthesia was alerted to the plan for cement.  The canal was irrigated and brushed and then dried with a tampon suction device.  The canal was then filled in retrograde fashion and pressurized with cement.  The implant was tapped into place excess cement was removed and it was held in place until the cement fully cured.  The trial ball was then placed on the neck and the hip was reduced.  Hip was taken through range of motion and found to be stable with equal leg lengths.  A real dual mobility ball was then implanted and checked for stability.  The acetabulum was irrigated and the hip was reduced.  The hip showed good range of motion and stability on testing with both stability in flexion internal rotation and extension external rotation.  The hip was then irrigated with betadine based surgiphor solution and pulsatile lavage with saline.  A 2 mm drill bit was then used to make 2 holes in the greater trochanter the piriformis and capsular tissues were reapproximated and passed through the drill holes and tied over the greater trochanter.  The fascia was then approximated with #1 Vicryl and #2 barbed suture.  The  subcutaneous tissues and skin were closed with 0 Vicryl 2-0 Vicryl and 4-0 barbed monofilament suture and the skin closed with Dermabond.  A sterile dressing was then applied.  Lap, sharps, and sponge counts were correct at the end of the case.   The patient was then rolled supine and an x-ray was taken in the operating room. Leg lengths were clinically equal on examination with a good distal pulse. Components appeared in good position with no fractures noted on x-ray.  Patient was then transferred to a hospital bed and transferred to the recovery room in stable condition.

## 2024-07-17 NOTE — Plan of Care (Signed)
  Problem: Education: Goal: Knowledge of the prescribed therapeutic regimen will improve Outcome: Progressing Goal: Understanding of discharge needs will improve Outcome: Progressing Goal: Individualized Educational Video(s) Outcome: Progressing   Problem: Activity: Goal: Ability to avoid complications of mobility impairment will improve Outcome: Progressing Goal: Ability to tolerate increased activity will improve Outcome: Progressing   Problem: Clinical Measurements: Goal: Postoperative complications will be avoided or minimized Outcome: Progressing   Problem: Pain Management: Goal: Pain level will decrease with appropriate interventions Outcome: Progressing   Problem: Skin Integrity: Goal: Will show signs of wound healing Outcome: Progressing   Problem: Education: Goal: Knowledge of General Education information will improve Description: Including pain rating scale, medication(s)/side effects and non-pharmacologic comfort measures Outcome: Progressing   Problem: Health Behavior/Discharge Planning: Goal: Ability to manage health-related needs will improve Outcome: Progressing   Problem: Clinical Measurements: Goal: Ability to maintain clinical measurements within normal limits will improve Outcome: Progressing Goal: Will remain free from infection Outcome: Progressing Goal: Diagnostic test results will improve Outcome: Progressing Goal: Respiratory complications will improve Outcome: Progressing Goal: Cardiovascular complication will be avoided Outcome: Progressing   Problem: Activity: Goal: Risk for activity intolerance will decrease Outcome: Progressing   Problem: Nutrition: Goal: Adequate nutrition will be maintained Outcome: Progressing   Problem: Coping: Goal: Level of anxiety will decrease Outcome: Progressing   Problem: Elimination: Goal: Will not experience complications related to bowel motility Outcome: Progressing Goal: Will not experience  complications related to urinary retention Outcome: Progressing   Problem: Pain Managment: Goal: General experience of comfort will improve and/or be controlled Outcome: Progressing   Problem: Safety: Goal: Ability to remain free from injury will improve Outcome: Progressing   Problem: Skin Integrity: Goal: Risk for impaired skin integrity will decrease Outcome: Progressing

## 2024-07-17 NOTE — Transfer of Care (Signed)
 Immediate Anesthesia Transfer of Care Note  Patient: Cassandra Brandt  Procedure(s) Performed: ARTHROPLASTY, HIP, TOTAL,POSTERIOR APPROACH (Right: Hip)  Patient Location: PACU  Anesthesia Type:General  Level of Consciousness: awake, alert , and drowsy  Airway & Oxygen Therapy: Patient Spontanous Breathing and Patient connected to face mask oxygen  Post-op Assessment: Report given to RN and Post -op Vital signs reviewed and stable  Post vital signs: Reviewed and stable  Last Vitals:  Vitals Value Taken Time  BP 99/61 07/17/24 12:51  Temp 36.6 C 07/17/24 12:51  Pulse 88 07/17/24 12:54  Resp 25 07/17/24 12:54  SpO2 97 % 07/17/24 12:54  Vitals shown include unfiled device data.  Last Pain:  Vitals:   07/17/24 0906  TempSrc: Temporal  PainSc: 8          Complications: No notable events documented.

## 2024-07-17 NOTE — Discharge Summary (Signed)
 Physician Discharge Summary  Patient ID: Cassandra Brandt MRN: [id number] DOB/AGE: 01-27-1956 68 y.o.  Admit date: 07/17/2024 Discharge date: 07/18/2024  Admission Diagnoses:  Osteoarthritis of right hip, unspecified osteoarthritis type [M16.11] Trochanteric bursitis of left hip [M70.62] S/P total right hip arthroplasty [Z96.641]   Discharge Diagnoses: Patient Active Problem List   Diagnosis Date Noted   S/P total right hip arthroplasty 07/17/2024   Encounter for smoking cessation counseling 06/05/2024   Mixed hyperlipidemia 04/26/2024   Post-operative state 03/20/2024   Right hip pain 03/20/2024   Intervertebral lumbar disc disorder with myelopathy, lumbar region 02/08/2024   Spinal stenosis, lumbar region, with neurogenic claudication 01/19/2024   Lumbar radiculopathy, right 01/19/2024   Ventral hernia 11/15/2023   Family history of colon cancer 11/15/2023   Depression, recurrent 09/23/2023   Other dysphagia 08/16/2023   Advanced care planning/counseling discussion 11/11/2022   Iron deficiency anemia 04/28/2021   Anxiety 01/30/2021   Chronic obstructive pulmonary disease, unspecified (HCC) 01/30/2021   Essential hypertension 01/30/2021   Vitamin D  deficiency 01/30/2021   Tobacco user 01/30/2021   Osteoarthritis of hip 01/30/2021   Gastroesophageal reflux disease 01/21/2021   H/O gastric bypass 01/21/2021   Primary insomnia 01/21/2021   RLS (restless legs syndrome) 01/21/2021    Past Medical History:  Diagnosis Date   3-vessel CAD    Allergy    Anemia    Anxiety    Arthritis    COPD (chronic obstructive pulmonary disease) (HCC)    Depression    GERD (gastroesophageal reflux disease)    History of Roux-en-Y gastric bypass    Hypertension 2021   Intervertebral lumbar disc disorder with myelopathy, lumbar region 02/08/2024   Mixed hyperlipidemia 04/26/2024   Neuromuscular disorder (HCC)    Nerve disease in spine   Restless leg syndrome    Sleep apnea    Spinal  stenosis, lumbar region with neurogenic claudication    Ventral hernia 11/15/2023   Vertigo    Wears dentures    full upper and lower     Transfusion: none   Consultants (if any):   Discharged Condition: Improved  Hospital Course: Cassandra Brandt is an 68 y.o. female who was admitted 07/17/2024 with a diagnosis of S/P total right hip arthroplasty and went to the operating room on 07/17/2024 and underwent the above named procedures.    Surgeries: Procedure(s): ARTHROPLASTY, HIP, TOTAL,POSTERIOR APPROACH on 07/17/2024 Patient tolerated the surgery well. Taken to PACU where she was stabilized and then transferred to the orthopedic floor.  Started on Lovenox 40 mg q 24 hrs. TEDs and SCDs applied bilaterally. Heels elevated on bed. No evidence of DVT. Negative Homan. Physical therapy started on day #1 for gait training and transfer. OT started day #1 for ADL and assisted devices.  Patient's IV was d/c on day #1. Patient was able to safely and independently complete all PT goals. PT recommending discharge to home.    On post op day #1 patient was stable and ready for discharge to home with HHPT.  Implants: : Cup: Trident Tritanium clusterhole 50/D w/x2 screws    Liner: MDM 38/D  Stem: Accolade C 132 deg # 5 w/ distal spacer  Head:LFIT 22.2 +0, MDM X3 22.2/38   She was given perioperative antibiotics:  Anti-infectives (From admission, onward)    Start     Dose/Rate Route Frequency Ordered Stop   07/17/24 1700  ceFAZolin  (ANCEF ) IVPB 2g/100 mL premix        2 g 200 mL/hr over 30 Minutes  Intravenous Every 6 hours 07/17/24 1424 07/18/24 0056   07/17/24 0900  ceFAZolin  (ANCEF ) IVPB 2g/100 mL premix        2 g 200 mL/hr over 30 Minutes Intravenous On call to O.R. 07/17/24 0845 07/17/24 1048     .  She was given sequential compression devices, early ambulation, and Lovenox TEDs for DVT prophylaxis.  She benefited maximally from the hospital stay and there were no complications.     Recent vital signs:  Vitals:   07/18/24 0426 07/18/24 0725  BP: 109/63 116/60  Pulse: 86 72  Resp: 18 15  Temp:  98.2 F (36.8 C)  SpO2: 94% 93%    Recent laboratory studies:  Lab Results  Component Value Date   HGB 11.4 (L) 07/18/2024   HGB 14.4 07/03/2024   HGB 13.9 04/26/2024   Lab Results  Component Value Date   WBC 13.2 (H) 07/18/2024   PLT 220 07/18/2024   No results found for: INR Lab Results  Component Value Date   NA 139 07/18/2024   K 3.9 07/18/2024   CL 105 07/18/2024   CO2 26 07/18/2024   BUN 14 07/18/2024   CREATININE 0.62 07/18/2024   GLUCOSE 114 (H) 07/18/2024    Discharge Medications:   Allergies as of 07/18/2024       Reactions   Penicillins Hives   Codeine Hives, Itching, Nausea Only   Levaquin [levofloxacin In D5w] Other (See Comments)   Thrush.    Lisinopril Cough   Methocarbamol  Nausea And Vomiting   Oxycodone -acetaminophen  Nausea Only   Tramadol  Nausea Only   Severe sweat, shakiness   Oxycodone  Itching        Medication List     STOP taking these medications    acetaminophen  650 MG CR tablet Commonly known as: TYLENOL        TAKE these medications    albuterol  108 (90 Base) MCG/ACT inhaler Commonly known as: VENTOLIN  HFA Inhale 2 puffs into the lungs every 6 (six) hours as needed for wheezing or shortness of breath.   amLODipine  5 MG tablet Commonly known as: NORVASC  Take 1 tablet (5 mg total) by mouth daily.   aspirin EC 81 MG tablet Take 81 mg by mouth in the morning. Swallow whole.   Breztri  Aerosphere 160-9-4.8 MCG/ACT Aero inhaler Generic drug: budesonide-glycopyrrolate-formoterol Inhale 2 puffs into the lungs 2 (two) times daily.   celecoxib  200 MG capsule Commonly known as: CeleBREX  Take 1 capsule (200 mg total) by mouth 2 (two) times daily for 14 days.   Clotrimazole  1 % Oint Apply 1 application  topically daily. What changed:  when to take this reasons to take this   docusate sodium  100 MG  capsule Commonly known as: COLACE Take 1 capsule (100 mg total) by mouth 2 (two) times daily.   DULoxetine  30 MG capsule Commonly known as: Cymbalta  Take 3 capsules (90 mg total) by mouth daily.   enoxaparin 40 MG/0.4ML injection Commonly known as: LOVENOX Inject 0.4 mLs (40 mg total) into the skin daily for 14 days.   HYDROcodone -acetaminophen  5-325 MG tablet Commonly known as: NORCO/VICODIN Take 1-2 tablets by mouth every 4 (four) hours as needed for severe pain (pain score 7-10).   ondansetron  4 MG tablet Commonly known as: ZOFRAN  Take 1 tablet (4 mg total) by mouth every 6 (six) hours as needed for nausea.   pantoprazole  40 MG tablet Commonly known as: PROTONIX  Take 1 tablet (40 mg total) by mouth 2 (two) times daily.   pregabalin   50 MG capsule Commonly known as: LYRICA  Take 1 capsule (50 mg total) by mouth 2 (two) times daily.   rosuvastatin  10 MG tablet Commonly known as: CRESTOR  Take 1 tablet (10 mg total) by mouth daily.   temazepam  30 MG capsule Commonly known as: RESTORIL  Take 1 capsule (30 mg total) by mouth at bedtime.   valsartan  160 MG tablet Commonly known as: DIOVAN  Take 1 tablet (160 mg total) by mouth daily.   varenicline 1 MG tablet Commonly known as: Chantix Continuing Month Pak Take 1 tablet (1 mg total) by mouth 2 (two) times daily.               Durable Medical Equipment  (From admission, onward)           Start     Ordered   07/17/24 1735  For home use only DME 3 n 1  Once        07/17/24 1734   07/17/24 1732  For home use only DME Walker  Once       Question:  Patient needs a walker to treat with the following condition  Answer:  H/O total hip arthroplasty, right   07/17/24 1734            Diagnostic Studies: DG Pelvis Portable Result Date: 07/17/2024 CLINICAL DATA:  Elective surgery, postop. EXAM: PORTABLE PELVIS 1-2 VIEWS COMPARISON:  None Available. FINDINGS: Right hip arthroplasty in expected alignment. No  periprosthetic lucency or fracture. Recent postsurgical change includes air and edema in the soft tissues. IMPRESSION: Right hip arthroplasty without immediate postoperative complication. Electronically Signed   By: Andrea Gasman M.D.   On: 07/17/2024 13:05    Disposition:      Follow-up Information     Charlene Debby BROCKS, PA-C Follow up in 2 week(s).   Specialties: Orthopedic Surgery, Emergency Medicine Contact information: [address] [place] [address] [phone]                  Signed: Debby BROCKS Charlene 07/18/2024, 8:00 AM

## 2024-07-17 NOTE — Progress Notes (Signed)
 Patient is not able to walk the distance required to go the bathroom, or she is unable to safely negotiate stairs required to access the bathroom.  A 3in1 BSC will alleviate this problem.       Lollie Marrow, PA-C [facility]

## 2024-07-17 NOTE — Anesthesia Preprocedure Evaluation (Signed)
 Anesthesia Evaluation  Patient identified by MRN, date of birth, ID band Patient awake    Reviewed: Allergy & Precautions, NPO status , Patient's Chart, lab work & pertinent test results  History of Anesthesia Complications Negative for: history of anesthetic complications  Airway Mallampati: III  TM Distance: <3 FB Neck ROM: full    Dental  (+) Missing   Pulmonary neg shortness of breath, sleep apnea , COPD, Current Smoker and Patient abstained from smoking.    + decreased breath sounds      Cardiovascular Exercise Tolerance: Good hypertension, (-) angina + CAD  Normal cardiovascular exam     Neuro/Psych  Neuromuscular disease  negative psych ROS   GI/Hepatic Neg liver ROS,GERD  Controlled,,  Endo/Other  negative endocrine ROS    Renal/GU      Musculoskeletal   Abdominal   Peds  Hematology negative hematology ROS (+)   Anesthesia Other Findings Past Medical History: No date: 3-vessel CAD No date: Allergy No date: Anemia No date: Anxiety No date: Arthritis No date: COPD (chronic obstructive pulmonary disease) (HCC) No date: Depression No date: GERD (gastroesophageal reflux disease) 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 2021: Hypertension 02/08/2024: Intervertebral lumbar disc disorder with myelopathy,  lumbar region 04/26/2024: Mixed hyperlipidemia No date: 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 No date: Restless leg syndrome No date: Sleep apnea 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, lumbar region with neurogenic claudication 11/15/2023: Ventral hernia No date: Vertigo No date: Wears dentures     Comment:  full upper and lower  Past Surgical History: 08/16/2023: BIOPSY     Comment:  Procedure: BIOPSY;  Surgeon: Therisa Bi, MD;  Location:              [facility] ENDOSCOPY;  Service: Gastroenterology;; No date: CESAREAN SECTION 08/16/2023: ESOPHAGOGASTRODUODENOSCOPY (EGD) WITH  PROPOFOL ; N/A     Comment:  Procedure: ESOPHAGOGASTRODUODENOSCOPY (EGD) WITH               PROPOFOL ;  Surgeon: Therisa Bi, MD;  Location: [facility]               ENDOSCOPY;  Service: Gastroenterology;  Laterality: N/A; No date: FRACTURE SURGERY No date: GASTRIC BYPASS 03/30/2023: HARDWARE REMOVAL; Left     Comment:  Procedure: Left distal radius DVR plate, hardware,               removal;  Surgeon: Kathlynn Sharper, MD;  Location: [facility]               SURGERY CNTR;  Service: Orthopedics;  Laterality: Left; 02/08/2024: LUMBAR LAMINECTOMY/DECOMPRESSION MICRODISCECTOMY; Right     Comment:  Procedure: LUMBAR LAMINECTOMY/DECOMPRESSION               MICRODISCECTOMY 2 LEVELS;  Surgeon: Claudene Penne ORN, MD;              Location: [facility] ORS;  Service: Neurosurgery;  Laterality:               Right;  MIS L2-3 HEMILAMINECTOMY L4-5 DISCECTOMY 12/29/2022: OPEN REDUCTION INTERNAL FIXATION (ORIF) DISTAL RADIAL  FRACTURE; Left     Comment:  Procedure: OPEN REDUCTION INTERNAL FIXATION (ORIF)               DISTAL RADIUS FRACTURE;  Surgeon: Kathlynn Sharper, MD;                Location: [facility] SURGERY CNTR;  Service: Orthopedics;  Laterality: Left; No date: TONSILLECTOMY No date: TUBAL LIGATION     Reproductive/Obstetrics negative OB ROS                              Anesthesia Physical Anesthesia Plan  ASA: 3  Anesthesia Plan: General ETT   Post-op Pain Management:    Induction: Intravenous  PONV Risk Score and Plan: Ondansetron , Dexamethasone , Midazolam  and Treatment may vary due to age or medical condition  Airway Management Planned: Oral ETT  Additional Equipment:   Intra-op Plan:   Post-operative Plan: Extubation in OR  Informed Consent: I have reviewed the patients History and Physical, chart, labs and discussed the procedure including the risks, benefits and alternatives for the proposed anesthesia with the patient or authorized representative who has  indicated his/her understanding and acceptance.     Dental Advisory Given  Plan Discussed with: Anesthesiologist, CRNA and Surgeon  Anesthesia Plan Comments: (Patient declines spinal and requests GA  Patient consented for risks of anesthesia including but not limited to:  - adverse reactions to medications - damage to eyes, teeth, lips or other oral mucosa - nerve damage due to positioning  - sore throat or hoarseness - Damage to heart, brain, nerves, lungs, other parts of body or loss of life  Patient voiced understanding and assent.)        Anesthesia Quick Evaluation

## 2024-07-17 NOTE — Anesthesia Postprocedure Evaluation (Signed)
 Anesthesia Post Note  Patient: Cassandra Brandt  Procedure(s) Performed: ARTHROPLASTY, HIP, TOTAL,POSTERIOR APPROACH (Right: Hip)  Patient location during evaluation: PACU Anesthesia Type: General Level of consciousness: awake and alert Pain management: pain level controlled Vital Signs Assessment: post-procedure vital signs reviewed and stable Respiratory status: spontaneous breathing, nonlabored ventilation and respiratory function stable Cardiovascular status: blood pressure returned to baseline and stable Postop Assessment: no apparent nausea or vomiting Anesthetic complications: no   No notable events documented.   Last Vitals:  Vitals:   07/17/24 1415 07/17/24 1433  BP: 106/64 97/61  Pulse: 71 79  Resp: 15 16  Temp:  36.4 C  SpO2: 95% 96%    Last Pain:  Vitals:   07/17/24 1433  TempSrc: Oral  PainSc: 5                  Fairy POUR Keliah Harned

## 2024-07-17 NOTE — TOC Initial Note (Signed)
 Transition of Care [facility]) - Initial/Assessment Note    Patient Details  Name: Cassandra Brandt MRN: [id number] Date of Birth: 10/26/1955  Transition of Care [facility]) CM/SW Contact:    Corrie JINNY Ruts, LCSW Phone Number: 07/17/2024, 4:23 PM  Clinical Narrative:                 Chart reviewed. I was able to speak with the patient at bedside today. The patient reports that a [facility] agency called her daughter today to set up [facility]. SW offered patient with a list and the patient declined and said she would go with the agency that called her daughter. The patient did report that she would not like [facility].   BSC ordered with adapt.     Barriers to Discharge: Continued Medical Work up   Patient Goals and CMS Choice            Expected Discharge Plan and Services                                              Prior Living Arrangements/Services     Patient language and need for interpreter reviewed:: Yes        Need for Family Participation in Patient Care: Yes (Comment) Care giver support system in place?: Yes (comment)   Criminal Activity/Legal Involvement Pertinent to Current Situation/Hospitalization: No - Comment as needed  Activities of Daily Living   ADL Screening (condition at time of admission) Independently performs ADLs?: Yes (appropriate for developmental age) Is the patient deaf or have difficulty hearing?: No Does the patient have difficulty seeing, even when wearing glasses/contacts?: No Does the patient have difficulty concentrating, remembering, or making decisions?: No  Permission Sought/Granted                  Emotional Assessment Appearance:: Appears stated age Attitude/Demeanor/Rapport: Gracious Affect (typically observed): Calm Orientation: : Oriented to Place, Oriented to  Time, Oriented to Situation, Oriented to Self Alcohol / Substance Use: Not Applicable Psych Involvement: No (comment)  Admission diagnosis:  Osteoarthritis of right hip,  unspecified osteoarthritis type [M16.11] Trochanteric bursitis of left hip [M70.62] S/P total right hip arthroplasty [S03.358] Patient Active Problem List   Diagnosis Date Noted   S/P total right hip arthroplasty 07/17/2024   Encounter for smoking cessation counseling 06/05/2024   Mixed hyperlipidemia 04/26/2024   Post-operative state 03/20/2024   Right hip pain 03/20/2024   Intervertebral lumbar disc disorder with myelopathy, lumbar region 02/08/2024   Spinal stenosis, lumbar region, with neurogenic claudication 01/19/2024   Lumbar radiculopathy, right 01/19/2024   Ventral hernia 11/15/2023   Family history of colon cancer 11/15/2023   Depression, recurrent 09/23/2023   Other dysphagia 08/16/2023   Advanced care planning/counseling discussion 11/11/2022   Iron deficiency anemia 04/28/2021   Anxiety 01/30/2021   Chronic obstructive pulmonary disease, unspecified (HCC) 01/30/2021   Essential hypertension 01/30/2021   Vitamin D  deficiency 01/30/2021   Tobacco user 01/30/2021   Osteoarthritis of hip 01/30/2021   Gastroesophageal reflux disease 01/21/2021   H/O gastric bypass 01/21/2021   Primary insomnia 01/21/2021   RLS (restless legs syndrome) 01/21/2021   PCP:  Melvin Pao, NP Pharmacy:   [facility] - [place], [place] - [address] [place] [address] Phone: [phone] Fax: [phone]     Social Drivers of Health (  SDOH) Social History: SDOH Screenings   Food Insecurity: Food Insecurity Present (07/17/2024)  Housing: Low Risk  (07/17/2024)  Transportation Needs: No Transportation Needs (07/17/2024)  Utilities: Not At Risk (07/17/2024)  Alcohol Screen: Low Risk  (11/16/2023)  Depression (PHQ2-9): Medium Risk (04/26/2024)  Financial Resource Strain: Medium Risk (05/31/2024)   Received from [facility]  Physical Activity: Insufficiently Active (04/22/2024)  Social Connections: Unknown (07/17/2024)  Stress: No Stress  Concern Present (04/22/2024)  Tobacco Use: High Risk (07/17/2024)  Health Literacy: Adequate Health Literacy (11/16/2023)   SDOH Interventions:     Readmission Risk Interventions     No data to display

## 2024-07-17 NOTE — Evaluation (Signed)
 Physical Therapy Evaluation Patient Details Name: Cassandra Brandt MRN: [id number] DOB: 1955-10-23 Today's Date: 07/17/2024  History of Present Illness  Cassandra Brandt is an 68 y.o. female who presents for history and physical for right posterior total hip arthroplasty with Dr. Lorelle on 07/17/2024. Patient has advanced right hip degenerative arthritis. She has had increasing pain over the last year that is interfering with her quality of life and activities daily living. She has severe pain in her buttocks lateral thigh and groin. Pain is sharp and increased with weightbearing activities. Her pain prevents her from walking. She is currently s/p R THA done by Dr. Lorelle.  Clinical Impression  Pt is a very pleasant 68 y.o. female admitted for elective R THA on 07/17/24. Pt was received in bed and was alert and agreeable to therapy. Pt edu on posterior hip precautions with teach back repeated 2-3 times throughout session. Pt weaned off 2L O2; remained 94-95% on RA throughout session. Pt instructed on how to achieve EOB position while abiding by precautions. Pt was able to complete bed mobility with mod A for guiding legs off the bed while pt was instructed on moving her shoulder in the opposite direction. Pt able to perform STS with RW with min A and did not endorse any dizziness, only nausea. Pt ambulated with RW and min A for 10 ft to [facility] set up in bathroom. Pt was able to perform stand to sit transfer with min A and vc to kickstand RLE. Pt able to complete hygiene independently. Pt then able to ambulated an addition 100 ft-- 50 ft out of her room and 50 ft back, with min A before sitting in recliner. Pt left in recliner on RA with all needs met. Pt will benefit from continued PT services to address post-operative weakness and functional mobility.      If plan is discharge home, recommend the following: A little help with walking and/or transfers   Can travel by private vehicle        Equipment  Recommendations BSC/3in1  Recommendations for Other Services  OT consult    Functional Status Assessment Patient has had a recent decline in their functional status and demonstrates the ability to make significant improvements in function in a reasonable and predictable amount of time.     Precautions / Restrictions Precautions Precautions: Posterior Hip Precaution Booklet Issued: No Recall of Precautions/Restrictions: Intact Restrictions Weight Bearing Restrictions Per Provider Order: Yes RLE Weight Bearing Per Provider Order: Weight bearing as tolerated      Mobility  Bed Mobility Overal bed mobility: Needs Assistance Bed Mobility: Supine to Sit     Supine to sit: Mod assist     General bed mobility comments: mod A at RLE to guide off bed; vc to shift shoulders left as SPT assist legs to the right off the bed; vc to scoot closer to EOB    Transfers Overall transfer level: Needs assistance Equipment used: Rolling walker (2 wheels) Transfers: Sit to/from Stand, Bed to chair/wheelchair/BSC Sit to Stand: Min assist   Step pivot transfers: Min assist       General transfer comment: able to perform STS from bed to RW with min A to maintain precautions; able to perform stand to sit transfer to [facility] with min A and vc to kickstand RLE.    Ambulation/Gait Ambulation/Gait assistance: Min assist, Contact guard assist Gait Distance (Feet): 120 Feet Assistive device: Rolling walker (2 wheels) Gait Pattern/deviations: Decreased stride length, Trunk flexed, Narrow base  of support       General Gait Details: able to ambulate 10 ft from bed to [facility] in bathroom with CGA and IV pole management; Additional STS with min A and amb with min A out into hallway for 50 ft and back before sitting back in recliner.  Stairs            Wheelchair Mobility     Tilt Bed    Modified Rankin (Stroke Patients Only)       Balance Overall balance assessment: Needs  assistance Sitting-balance support: No upper extremity supported Sitting balance-Leahy Scale: Good Sitting balance - Comments: able to maintain without UE support   Standing balance support: Bilateral upper extremity supported, During functional activity Standing balance-Leahy Scale: Fair Standing balance comment: able to maintain with BUE support on RW                             Pertinent Vitals/Pain Pain Assessment Pain Assessment: 0-10 Pain Score: 6  Pain Location: R hip Pain Descriptors / Indicators: Aching Pain Intervention(s): Monitored during session    Home Living Family/patient expects to be discharged to:: Private residence Living Arrangements: Alone Available Help at Discharge: Family;Friend(s) Type of Home: Apartment Home Access: Level entry       Home Layout: One level Home Equipment: Agricultural Consultant (2 wheels);Cane - single point Additional Comments: cousin staying with pt for a few days after surgery for assist    Prior Function Prior Level of Function : Independent/Modified Independent             Mobility Comments: IND with mobility; pain with mobility ADLs Comments: IND with all ADLs     Extremity/Trunk Assessment   Upper Extremity Assessment Upper Extremity Assessment: Overall WFL for tasks assessed    Lower Extremity Assessment Lower Extremity Assessment: RLE deficits/detail RLE Deficits / Details: s/p THA; WBAT; 6/10 pain at start of visit; decreased sensation but feels pressure upon assessment RLE Sensation: decreased light touch    Cervical / Trunk Assessment Cervical / Trunk Assessment: Kyphotic  Communication   Communication Communication: No apparent difficulties Factors Affecting Communication: Hearing impaired    Cognition Arousal: Alert Behavior During Therapy: WFL for tasks assessed/performed   PT - Cognitive impairments: No apparent impairments                         Following commands: Intact        Cueing Cueing Techniques: Verbal cues     General Comments      Exercises Other Exercises Other Exercises: edu and teach back on posterior hip precautions Other Exercises: edu for safe hand placement on RW Other Exercises: for BSC: min A to perform stand to sit & sit to stand; vc to kickstand operative leg upon sitting; supervision for pericare/hygiene   Assessment/Plan    PT Assessment Patient needs continued PT services  PT Problem List Decreased strength;Decreased range of motion;Decreased activity tolerance;Decreased balance;Decreased mobility       PT Treatment Interventions DME instruction;Gait training;Stair training;Functional mobility training    PT Goals (Current goals can be found in the Care Plan section)  Acute Rehab PT Goals Patient Stated Goal: to return home PT Goal Formulation: With patient Time For Goal Achievement: 07/31/24 Potential to Achieve Goals: Good    Frequency BID     Co-evaluation               AM-PAC  PT 6 Clicks Mobility  Outcome Measure Help needed turning from your back to your side while in a flat bed without using bedrails?: A Little Help needed moving from lying on your back to sitting on the side of a flat bed without using bedrails?: A Little Help needed moving to and from a bed to a chair (including a wheelchair)?: A Little Help needed standing up from a chair using your arms (e.g., wheelchair or bedside chair)?: A Little Help needed to walk in hospital room?: A Little Help needed climbing 3-5 steps with a railing? : A Lot 6 Click Score: 17    End of Session Equipment Utilized During Treatment: Gait belt Activity Tolerance: Patient tolerated treatment well Patient left: in chair;with call bell/phone within reach   PT Visit Diagnosis: Unsteadiness on feet (R26.81)    Time: 8456-8387 PT Time Calculation (min) (ACUTE ONLY): 29 min   Charges:   PT Evaluation $PT Eval Low Complexity: 1 Low PT Treatments $Gait  Training: 8-22 mins PT General Charges $$ ACUTE PT VISIT: 1 Visit         Allena Bulls, SPT   Allena Bulls 07/17/2024, 4:46 PM

## 2024-07-17 NOTE — Anesthesia Procedure Notes (Signed)
 Procedure Name: Intubation Date/Time: 07/17/2024 10:28 AM  Performed by: Jackye Spanner, CRNAPre-anesthesia Checklist: Patient identified, Patient being monitored, Timeout performed, Emergency Drugs available and Suction available Patient Re-evaluated:Patient Re-evaluated prior to induction Oxygen Delivery Method: Circle system utilized Preoxygenation: Pre-oxygenation with 100% oxygen Induction Type: IV induction Ventilation: Mask ventilation without difficulty Laryngoscope Size: 3 and McGrath Grade View: Grade I Tube type: Oral Tube size: 7.0 mm Number of attempts: 1 Airway Equipment and Method: Stylet Placement Confirmation: ETT inserted through vocal cords under direct vision, positive ETCO2 and breath sounds checked- equal and bilateral Secured at: 21 cm Tube secured with: Tape Dental Injury: Teeth and Oropharynx as per pre-operative assessment  Comments: Smooth atraumatic intubation, no complications noted.

## 2024-07-17 NOTE — Discharge Instructions (Signed)
Instructions after Posterior Total Hip Replacement        Dr. Regenia Skeeter., M.D.      Dept. of Orthopaedics & Sports Medicine  [facility]  [address]  [place], [place]  [address]  Phone: [phone]   Fax: [phone]    DIET: Drink plenty of non-alcoholic fluids. Resume your normal diet. Include foods high in fiber.  ACTIVITY:  You may use crutches or a walker with weight-bearing as tolerated, unless instructed otherwise. You may be weaned off of the walker or crutches by your Physical Therapist.  Do NOT reach below the level of your knees or cross your legs until allowed.    Continue doing gentle exercises. Exercising will reduce the pain and swelling, increase motion, and prevent muscle weakness.   Please continue to use the TED compression stockings for 2 weeks. You may remove the stockings at night, but should reapply them in the morning. Do not drive or operate any equipment until instructed.  WOUND CARE:  Continue to use ice packs periodically to reduce pain and swelling. You may shower with honeycomb dressing 3 days after surgery. Do not submerge incision site under water. Remove honeycomb dressing 7 days after surgery and allow dermabond to fall off on its own.   MEDICATIONS: You may resume your regular medications. Please take the pain medication as prescribed on the medication list. Do not take pain medication on an empty stomach. You have been given a prescription for a blood thinner to prevent blood clots. Please take the medication as instructed. (NOTE: After completing a 2 week course of Lovenox, take one Enteric-coated 81 mg aspirin twice a day for 3 additional weeks.) Pain medications and iron supplements can cause constipation. Use a stool softener (Senokot or Colace) on a daily basis and a laxative (dulcolax or miralax) as needed. Do not drive or drink alcoholic beverages when taking pain medications.   POSTOPERATIVE CONSTIPATION  PROTOCOL Constipation - defined medically as fewer than three stools per week and severe constipation as less than one stool per week.  One of the most common issues patients have following surgery is constipation.  Even if you have a regular bowel pattern at home, your normal regimen is likely to be disrupted due to multiple reasons following surgery.  Combination of anesthesia, postoperative narcotics, change in appetite and fluid intake all can affect your bowels.  In order to avoid complications following surgery, here are some recommendations in order to help you during your recovery period.  Colace (docusate) - Pick up an over-the-counter form of Colace or another stool softener and take twice a day as long as you are requiring postoperative pain medications.  Take with a full glass of water daily.  If you experience loose stools or diarrhea, hold the colace until you stool forms back up.  If your symptoms do not get better within 1 week or if they get worse, check with your doctor.  Dulcolax (bisacodyl) - Pick up over-the-counter and take as directed by the product packaging as needed to assist with the movement of your bowels.  Take with a full glass of water.  Use this product as needed if not relieved by Colace only.   MiraLax (polyethylene glycol) - Pick up over-the-counter to have on hand.  MiraLax is a solution that will increase the amount of water in your bowels to assist with bowel movements.  Take as directed and can mix with a glass of water, juice, soda, coffee, or tea.  Take if you go more than two days without a movement. Do not use MiraLax more than once per day. Call your doctor if you are still constipated or irregular after using this medication for 7 days in a row.  If you continue to have problems with postoperative constipation, please contact the office for further assistance and recommendations.  If you experience "the worst abdominal pain ever" or develop nausea or  vomiting, please contact the office immediatly for further recommendations for treatment.   CALL THE OFFICE FOR: Temperature above 101 degrees Excessive bleeding or drainage on the dressing. Excessive swelling, coldness, or paleness of the toes. Persistent nausea and vomiting.  FOLLOW-UP:  You should have an appointment to return to the office in 2 weeks after surgery. Arrangements have been made for continuation of Physical Therapy (either home therapy or outpatient therapy).

## 2024-07-18 ENCOUNTER — Encounter: Payer: Self-pay | Admitting: Orthopedic Surgery

## 2024-07-18 ENCOUNTER — Encounter: Payer: Self-pay | Admitting: Nurse Practitioner

## 2024-07-18 ENCOUNTER — Other Ambulatory Visit: Payer: Self-pay

## 2024-07-18 DIAGNOSIS — M1611 Unilateral primary osteoarthritis, right hip: Secondary | ICD-10-CM | POA: Diagnosis not present

## 2024-07-18 LAB — BASIC METABOLIC PANEL WITH GFR
Anion gap: 9 (ref 5–15)
BUN: 14 mg/dL (ref 8–23)
CO2: 26 mmol/L (ref 22–32)
Calcium: 8.5 mg/dL — ABNORMAL LOW (ref 8.9–10.3)
Chloride: 105 mmol/L (ref 98–111)
Creatinine, Ser: 0.62 mg/dL (ref 0.44–1.00)
GFR, Estimated: >60 mL/min (ref >60)
Glucose, Bld: 114 mg/dL — ABNORMAL HIGH (ref 70–99)
Potassium: 3.9 mmol/L (ref 3.5–5.1)
Sodium: 139 mmol/L (ref 135–145)

## 2024-07-18 LAB — CBC
HCT: 35.5 % — ABNORMAL LOW (ref 36.0–46.0)
Hemoglobin: 11.4 g/dL — ABNORMAL LOW (ref 12.0–15.0)
MCH: 29.4 pg (ref 26.0–34.0)
MCHC: 32.1 g/dL (ref 30.0–36.0)
MCV: 91.5 fL (ref 80.0–100.0)
Platelets: 220 K/uL (ref 150–400)
RBC: 3.88 MIL/uL (ref 3.87–5.11)
RDW: 14.6 % (ref 11.5–15.5)
WBC: 13.2 K/uL — ABNORMAL HIGH (ref 4.0–10.5)
nRBC: 0 % (ref 0.0–0.2)

## 2024-07-18 NOTE — Progress Notes (Signed)
   Subjective: 1 Day Post-Op Procedure(s) (LRB): ARTHROPLASTY, HIP, TOTAL,POSTERIOR APPROACH (Right) Patient reports pain as mild.   Patient is well, and has had no acute complaints or problems Denies any CP, SOB, ABD pain. We will continue therapy today.  Plan is to go Home after hospital stay.  Objective: Vital signs in last 24 hours: Temp:  [96.9 F (36.1 C)-98.2 F (36.8 C)] 98.2 F (36.8 C) (11/18 0725) Pulse Rate:  [71-96] 72 (11/18 0725) Resp:  [10-18] 15 (11/18 0725) BP: (89-117)/(56-75) 116/60 (11/18 0725) SpO2:  [93 %-98 %] 93 % (11/18 0725) Weight:  [84.4 kg] 84.4 kg (11/18 0449)  Intake/Output from previous day: 11/17 0701 - 11/18 0700 In: 2310.6 [I.V.:1697.7; IV Piggyback:612.9] Out: 200 [Blood:200] Intake/Output this shift: No intake/output data recorded.  Recent Labs    07/18/24 0626  HGB 11.4*   Recent Labs    07/18/24 0626  WBC 13.2*  RBC 3.88  HCT 35.5*  PLT 220   Recent Labs    07/18/24 0626  NA 139  K 3.9  CL 105  CO2 26  BUN 14  CREATININE 0.62  GLUCOSE 114*  CALCIUM  8.5*   No results for input(s): LABPT, INR in the last 72 hours.  EXAM General - Patient is Alert, Appropriate, and Oriented Extremity - Neurovascular intact Sensation intact distally Intact pulses distally Dorsiflexion/Plantar flexion intact No cellulitis present Compartment soft Dressing - dressing C/D/I and no drainage Motor Function - intact, moving foot and toes well on exam.   Past Medical History:  Diagnosis Date   3-vessel CAD    Allergy    Anemia    Anxiety    Arthritis    COPD (chronic obstructive pulmonary disease) (HCC)    Depression    GERD (gastroesophageal reflux disease)    History of Roux-en-Y gastric bypass    Hypertension 2021   Intervertebral lumbar disc disorder with myelopathy, lumbar region 02/08/2024   Mixed hyperlipidemia 04/26/2024   Neuromuscular disorder (HCC)    Nerve disease in spine   Restless leg syndrome    Sleep  apnea    Spinal stenosis, lumbar region with neurogenic claudication    Ventral hernia 11/15/2023   Vertigo    Wears dentures    full upper and lower    Assessment/Plan:   1 Day Post-Op Procedure(s) (LRB): ARTHROPLASTY, HIP, TOTAL,POSTERIOR APPROACH (Right) Principal Problem:   S/P total right hip arthroplasty  Estimated body mass index is 31.93 kg/m as calculated from the following:   Height as of this encounter: 5' 4 (1.626 m).   Weight as of this encounter: 84.4 kg. Advance diet Up with therapy, posterior hip precautions. Pain well controlled VSS  Labs stable CM to assist with discharge to home with HHPT  DVT Prophylaxis - Lovenox, TED hose, and SCDs Weight-Bearing as tolerated to right leg   T. Medford Amber, PA-C [facility] 07/18/2024, 7:58 AM

## 2024-07-18 NOTE — Plan of Care (Signed)
?  Problem: Education: ?Goal: Knowledge of the prescribed therapeutic regimen will improve ?Outcome: Progressing ?  ?Problem: Activity: ?Goal: Ability to avoid complications of mobility impairment will improve ?Outcome: Progressing ?  ?Problem: Pain Management: ?Goal: Pain level will decrease with appropriate interventions ?Outcome: Progressing ?  ?Problem: Skin Integrity: ?Goal: Will show signs of wound healing ?Outcome: Progressing ?  ?

## 2024-07-18 NOTE — Evaluation (Signed)
 Occupational Therapy Evaluation Patient Details Name: Cassandra Brandt MRN: [id number] DOB: [birth year] Today's Date: 07/18/2024   History of Present Illness   Cassandra Brandt is an 68 y.o. female who presents for history and physical for right posterior total hip arthroplasty with Dr. Lorelle on 07/17/2024. Patient has advanced right hip degenerative arthritis. She has had increasing pain over the last year that is interfering with her quality of life and activities daily living. She has severe pain in her buttocks lateral thigh and groin. Pain is sharp and increased with weightbearing activities. Her pain prevents her from walking. She is currently s/p R THA done by Dr. Lorelle.     Clinical Impressions Pt seen for OT evaluation this date, POD#1 from above surgery. Pt was independent in all ADLs prior to surgery. Pt is eager to return to PLOF with less pain and improved safety and independence. Pt currently requires SUPERVISION for safety for LB dressing while in seated position due to pain and limited AROM of R hip. Pt able to recall 2/3 posterior total hip precautions at start of session and unable to verbalize how to implement during ADL and mobility. Pt instructed in posterior total hip precautions and how to implement, self care skills, falls prevention strategies, home/routines modifications, DME/AE for LB bathing and dressing tasks, compression stocking mgt strategies, and car transfer techniques. At end of session, pt able to recall 3/3 posterior total hip precautions. All education and assessment completed at this time. Do not anticipate the need for follow up OT services upon hospital DC.      If plan is discharge home, recommend the following:   A little help with bathing/dressing/bathroom;Help with stairs or ramp for entrance;Assist for transportation;Assistance with cooking/housework     Functional Status Assessment   Patient has had a recent decline in their functional status and  demonstrates the ability to make significant improvements in function in a reasonable and predictable amount of time.     Equipment Recommendations   None recommended by OT     Recommendations for Other Services         Precautions/Restrictions   Precautions Precautions: Posterior Hip Precaution Booklet Issued: Yes (comment) Recall of Precautions/Restrictions: Intact Restrictions Weight Bearing Restrictions Per Provider Order: Yes RLE Weight Bearing Per Provider Order: Weight bearing as tolerated     Mobility Bed Mobility               General bed mobility comments: NT - pt received in recliner    Transfers Overall transfer level: Needs assistance   Transfers: Sit to/from Stand Sit to Stand: Supervision           General transfer comment: SUPERVISION for STS from recliner and functional mobility in room. Pt does tend to abandon walker, educated on importance of keeping it close to her.      Balance Overall balance assessment: Needs assistance Sitting-balance support: No upper extremity supported, Feet supported Sitting balance-Leahy Scale: Good     Standing balance support: During functional activity, No upper extremity supported Standing balance-Leahy Scale: Good                             ADL either performed or assessed with clinical judgement   ADL Overall ADL's : At baseline  General ADL Comments: Pt recieved dressed in clothing from home. Denies difficulty with dressing herself this am. Performs toilet transfer and standing grooming at sink with supervision. Min cueing for safe use of RW provided. Educated on AE/DME to maximize safety and functional independence while helping to maintain Posterior THPs. Pt return demos understanding of education provided. Able to recall 3/3 THP's at end of session.     Vision Patient Visual Report: No change from baseline       Perception          Praxis         Pertinent Vitals/Pain Pain Assessment Pain Assessment: Faces Faces Pain Scale: Hurts a little bit Pain Location: R hip Pain Descriptors / Indicators: Aching, Guarding Pain Intervention(s): Limited activity within patient's tolerance, Monitored during session, Premedicated before session, Repositioned     Extremity/Trunk Assessment Upper Extremity Assessment Upper Extremity Assessment: Overall WFL for tasks assessed   Lower Extremity Assessment Lower Extremity Assessment: Defer to PT evaluation;RLE deficits/detail RLE Deficits / Details: s/p THA; WBAT with Post THPs       Communication Communication Communication: No apparent difficulties Factors Affecting Communication: Hearing impaired   Cognition Arousal: Alert Behavior During Therapy: WFL for tasks assessed/performed                                 Following commands: Intact       Cueing  General Comments   Cueing Techniques: Verbal cues      Exercises Other Exercises Other Exercises: Pt educated on safe use of AE/DME for ADL management in setting of posterior THP's, falls prevention strategies, DC recs, and compression stocking management.   Shoulder Instructions      Home Living Family/patient expects to be discharged to:: Private residence Living Arrangements: Alone Available Help at Discharge: Family;Friend(s) Type of Home: Apartment Home Access: Level entry     Home Layout: One level     Bathroom Shower/Tub: Chief Strategy Officer: Standard     Home Equipment: Agricultural Consultant (2 wheels);Cane - single point   Additional Comments: cousin staying with pt for a few days after surgery for assist      Prior Functioning/Environment Prior Level of Function : Independent/Modified Independent;Driving             Mobility Comments: IND with mobility; denies falls history ADLs Comments: IND with all ADLs/IADLs, driving.    OT Problem List:  Decreased strength;Decreased coordination;Decreased range of motion;Pain;Impaired balance (sitting and/or standing);Decreased knowledge of use of DME or AE;Decreased knowledge of precautions;Decreased safety awareness   OT Treatment/Interventions:        OT Goals(Current goals can be found in the care plan section)   Acute Rehab OT Goals Patient Stated Goal: To go home OT Goal Formulation: All assessment and education complete, DC therapy Time For Goal Achievement: 07/18/24 Potential to Achieve Goals: Good   OT Frequency:       Co-evaluation              AM-PAC OT 6 Clicks Daily Activity     Outcome Measure Help from another person eating meals?: None Help from another person taking care of personal grooming?: None Help from another person toileting, which includes using toliet, bedpan, or urinal?: A Little Help from another person bathing (including washing, rinsing, drying)?: A Little Help from another person to put on and taking off regular upper body clothing?: None Help from another  person to put on and taking off regular lower body clothing?: A Little 6 Click Score: 21   End of Session Equipment Utilized During Treatment: Gait belt;Rolling walker (2 wheels) Nurse Communication: Mobility status  Activity Tolerance: Patient tolerated treatment well Patient left: in chair;with call bell/phone within reach;with chair alarm set  OT Visit Diagnosis: Other abnormalities of gait and mobility (R26.89);Pain Pain - Right/Left: Right Pain - part of body: Hip                Time: 9082-9065 OT Time Calculation (min): 17 min Charges:  OT General Charges $OT Visit: 1 Visit OT Evaluation $OT Eval Low Complexity: 1 Low OT Treatments $Self Care/Home Management : 8-22 mins  Jhonny Pelton, M.S., OTR/L 07/18/24, 11:16 AM

## 2024-07-18 NOTE — Progress Notes (Signed)
 Physical Therapy Treatment Patient Details Name: Cassandra Brandt MRN: [id number] DOB: 1956/08/25 Today's Date: 07/18/2024   History of Present Illness Cassandra Brandt is an 68 y.o. female who presents for history and physical for right posterior total hip arthroplasty with Dr. Lorelle on 07/17/2024. Patient has advanced right hip degenerative arthritis. She has had increasing pain over the last year that is interfering with her quality of life and activities daily living. She has severe pain in her buttocks lateral thigh and groin. Pain is sharp and increased with weightbearing activities. Her pain prevents her from walking. She is currently s/p R THA done by Dr. Lorelle.    PT Comments  Pt was received in the recliner and was alert and agreeable to PT treatment. Pt did not report any pain at start of session. SPT reviewed hip precautions and pt was able to recall 3/3. Pt instructed on THA HEP in recliner and verbalized/demonstrated understanding to all exercises. O2 at 96% RA throughout session. Pt required min A to perform STS to RW d/t decreased mechanical advantage with hip precautions, but once she was standing she was supervision for the rest of the session. Pt able to ambulate 300 ft with RW and no concern for LOB or dizziness. SPT instructed pt how to make turns to the R with cues for R foot ER to maintain precautions in standing. Stairs not assessed d/t pt preference and d/t the fact she does not need to ascend/descend stairs in her home. Pt left in recliner with all needs met. Pt verbalized she no longer requires the [facility] because she has a toilet riser at home. Pt is cleared by PT for discharge.   If plan is discharge home, recommend the following: A little help with walking and/or transfers   Can travel by private vehicle        Equipment Recommendations  None recommended by PT    Recommendations for Other Services       Precautions / Restrictions Precautions Precautions: Posterior  Hip Precaution Booklet Issued: Yes (comment) (HEP reviewed, precautions reviewed and pt recall 3/3) Recall of Precautions/Restrictions: Intact Restrictions Weight Bearing Restrictions Per Provider Order: Yes RLE Weight Bearing Per Provider Order: Weight bearing as tolerated     Mobility  Bed Mobility               General bed mobility comments: NT - pt received in recliner    Transfers Overall transfer level: Needs assistance Equipment used: Rolling walker (2 wheels) Transfers: Sit to/from Stand Sit to Stand: Min assist           General transfer comment: min A for STS d/t decreased mechanical advantage with reduced trunk flexion to stand    Ambulation/Gait Ambulation/Gait assistance: Supervision Gait Distance (Feet): 300 Feet Assistive device: Rolling walker (2 wheels) Gait Pattern/deviations: Decreased stride length, Trunk flexed, Narrow base of support       General Gait Details: ambulated 300 ft with RW; pt declined need for stiars as she lives in level home and reports never using any stairs; no LOB or SOB noted; practice turning to R with cues for ER steps   Stairs             Wheelchair Mobility     Tilt Bed    Modified Rankin (Stroke Patients Only)       Balance Overall balance assessment: Needs assistance Sitting-balance support: No upper extremity supported Sitting balance-Leahy Scale: Good Sitting balance - Comments: able to maintain without UE  support   Standing balance support: Bilateral upper extremity supported, During functional activity Standing balance-Leahy Scale: Good Standing balance comment: able to maintain with BUE support on RW                            Communication Communication Communication: No apparent difficulties Factors Affecting Communication: Hearing impaired  Cognition Arousal: Alert Behavior During Therapy: WFL for tasks assessed/performed   PT - Cognitive impairments: No apparent  impairments                         Following commands: Intact      Cueing Cueing Techniques: Verbal cues  Exercises Other Exercises Other Exercises: edu and teach back on 3/3 posterior hip precautions Other Exercises: edu on turning to R with cues for ER to maintain precuations    General Comments        Pertinent Vitals/Pain Pain Assessment Pain Assessment: No/denies pain    Home Living Family/patient expects to be discharged to:: Private residence Living Arrangements: Alone Available Help at Discharge: Family;Friend(s) Type of Home: Apartment Home Access: Level entry       Home Layout: One level Home Equipment: Agricultural Consultant (2 wheels);Cane - single point Additional Comments: cousin staying with pt for a few days after surgery for assist    Prior Function            PT Goals (current goals can now be found in the care plan section) Acute Rehab PT Goals Patient Stated Goal: to return home PT Goal Formulation: With patient Time For Goal Achievement: 07/31/24 Potential to Achieve Goals: Good Progress towards PT goals: Progressing toward goals    Frequency    BID      PT Plan      Co-evaluation              AM-PAC PT 6 Clicks Mobility   Outcome Measure  Help needed turning from your back to your side while in a flat bed without using bedrails?: A Little Help needed moving from lying on your back to sitting on the side of a flat bed without using bedrails?: A Little Help needed moving to and from a bed to a chair (including a wheelchair)?: A Little Help needed standing up from a chair using your arms (e.g., wheelchair or bedside chair)?: A Little Help needed to walk in hospital room?: A Little Help needed climbing 3-5 steps with a railing? : A Lot 6 Click Score: 17    End of Session   Activity Tolerance: Patient tolerated treatment well Patient left: in chair;with call bell/phone within reach Nurse Communication: Mobility  status PT Visit Diagnosis: Unsteadiness on feet (R26.81)     Time: 9153-9096 PT Time Calculation (min) (ACUTE ONLY): 17 min  Charges:    $Gait Training: 8-22 mins PT General Charges $$ ACUTE PT VISIT: 1 Visit                     Allena Bulls, SPT    Allena Bulls 07/18/2024, 9:55 AM

## 2024-07-18 NOTE — Progress Notes (Signed)
 DISCHARGE NOTE:   Pt dc with IV removed and dc instructions given. Pt educated on how to administer Lovenox injection. Pt has both TED hose on and in place and given empty ice pack for at home use. Pt voices no questions or concerns at this time. Pt wheeled down to medical mall entrance by staff. Pt's cousin provided transportation.

## 2024-07-18 NOTE — Plan of Care (Signed)
  Problem: Activity: Goal: Ability to avoid complications of mobility impairment will improve Outcome: Progressing Goal: Ability to tolerate increased activity will improve Outcome: Progressing   Problem: Pain Management: Goal: Pain level will decrease with appropriate interventions Outcome: Progressing   

## 2024-07-20 ENCOUNTER — Telehealth: Payer: Self-pay

## 2024-07-20 NOTE — Telephone Encounter (Signed)
 Gave pt a call pt has been pre-approved for pap [employer] ,left a HIPAA VM.

## 2024-07-20 NOTE — Telephone Encounter (Signed)
 Pt call back said she call [employer]  and is approved thru 08/30/2025

## 2024-07-31 ENCOUNTER — Telehealth: Admitting: Nurse Practitioner

## 2024-07-31 ENCOUNTER — Encounter: Payer: Self-pay | Admitting: Nurse Practitioner

## 2024-07-31 VITALS — BP 117/65 | Temp 98.1°F | Ht 64.02 in

## 2024-07-31 DIAGNOSIS — J449 Chronic obstructive pulmonary disease, unspecified: Secondary | ICD-10-CM | POA: Diagnosis not present

## 2024-07-31 DIAGNOSIS — F5101 Primary insomnia: Secondary | ICD-10-CM

## 2024-07-31 DIAGNOSIS — F339 Major depressive disorder, recurrent, unspecified: Secondary | ICD-10-CM

## 2024-07-31 MED ORDER — VARENICLINE TARTRATE 1 MG PO TABS: 1.0000 mg | ORAL_TABLET | Freq: Two times a day (BID) | ORAL | 1 refills | Status: AC

## 2024-07-31 MED ORDER — DULOXETINE HCL 30 MG PO CPEP: 90.0000 mg | ORAL_CAPSULE | Freq: Every day | ORAL | 1 refills | Status: DC

## 2024-07-31 MED ORDER — PREGABALIN 50 MG PO CAPS: 50.0000 mg | ORAL_CAPSULE | Freq: Two times a day (BID) | ORAL | 1 refills | Status: DC

## 2024-07-31 MED ORDER — TEMAZEPAM 30 MG PO CAPS: 30.0000 mg | ORAL_CAPSULE | Freq: Every day | ORAL | 2 refills | Status: AC

## 2024-07-31 NOTE — Assessment & Plan Note (Signed)
 Chronic.  Controlled.  Will increase dose of Duloxetine  to 90mg  daily.  Refills sent today.  Return to clinic in 3 months for reevaluation.  Call sooner if concerns arise.

## 2024-07-31 NOTE — Assessment & Plan Note (Signed)
 Chronic.  Has stopped smoking.  She is using Chantix  to help with her smoking cessation.

## 2024-07-31 NOTE — Assessment & Plan Note (Signed)
 Chronic. Controlled with Temazepam .  Patient aware of risks of taking medication and okay with continuing.  UDS and controlled substance agreement up to date as of January 2025. Refills sent today.  Side effects and benefits of medication discussed during visit.  Follow up in 3 months.

## 2024-07-31 NOTE — Progress Notes (Signed)
 BP 117/65 (BP Location: Left Arm, Patient Position: Sitting)   Temp 98.1 F (36.7 C)   Ht 5' 4.02 (1.626 m)   BMI 31.91 kg/m    Subjective:    Patient ID: Cassandra Brandt, female    DOB: March 25, 1956, 68 y.o.   MRN: [id number]  HPI: Cassandra Brandt is a 68 y.o. female  Chief Complaint  Patient presents with   office visit    No concerns   INSOMNIA Doing well with the Temazepam .  Denies concerns at visit today.  Duration: years Satisfied with sleep quality: yes Difficulty falling asleep: no Difficulty staying asleep: no Waking a few hours after sleep onset: no Early morning awakenings: no Daytime hypersomnolence: yes Wakes feeling refreshed: yes Good sleep hygiene: yes Apnea: no Snoring: yes Depressed/anxious mood: yes Recent stress: yes Restless legs/nocturnal leg cramps: no Chronic pain/arthritis: no History of sleep study: no Treatments attempted: temazepam       04/26/2024   10:00 AM  Results of the Epworth flowsheet  Sitting and reading 1  Watching TV 3  Sitting, inactive in a public place (e.g. a theatre or a meeting) 0  As a passenger in a car for an hour without a break 0  Lying down to rest in the afternoon when circumstances permit 3  Sitting and talking to someone 0  Sitting quietly after a lunch without alcohol 0  In a car, while stopped for a few minutes in traffic 0  Total score 7     DEPRESSION Patient states she is in a better place.  She needs an updated prescription of Duloxetine .  She feels like it is working well for her mood.  She has been a little down due to her recent surgery.    Flowsheet Row Telemedicine from 07/31/2024 in [facility]  PHQ-9 Total Score 4      07/31/2024    9:15 AM 04/26/2024   10:20 AM 03/07/2024    3:14 PM 01/25/2024    9:44 AM  GAD 7 : Generalized Anxiety Score  Nervous, Anxious, on Edge 1 1 0 1  Control/stop worrying 1 1 0 1  Worry too much - different things 1 1 0 1  Trouble relaxing 1 2 0  1  Restless 0 1 0 1  Easily annoyed or irritable 0 2 0 1  Afraid - awful might happen 0 0 0 0  Total GAD 7 Score 4 8 0 6  Anxiety Difficulty Not difficult at all Not difficult at all Not difficult at all     COPD Using Breztri  and doing well.  She is no longer smoking.  COPD status: controlled Satisfied with current treatment?: yes Oxygen use: no Dyspnea frequency:  none Cough frequency: none Rescue inhaler frequency:  none Limitation of activity: yes Productive cough: none Last Spirometry:  Pneumovax: Up to Date Influenza: Up to Date    BACK PAIN States she is two weeks out from her surgery.  She only took two  pain pills.  She is doing physical therapy since she came home. Se is still taking the pregabalin .    Relevant past medical, surgical, family and social history reviewed and updated as indicated. Interim medical history since our last visit reviewed. Allergies and medications reviewed and updated.  Review of Systems  Eyes:  Negative for visual disturbance.  Respiratory:  Positive for shortness of breath. Negative for cough and chest tightness.   Cardiovascular:  Negative for chest pain, palpitations and leg  swelling.  Musculoskeletal:  Positive for back pain.  Neurological:  Negative for dizziness and headaches.  Psychiatric/Behavioral:  Positive for dysphoric mood and sleep disturbance. Negative for self-injury. The patient is nervous/anxious.     Per HPI unless specifically indicated above     Objective:    BP 117/65 (BP Location: Left Arm, Patient Position: Sitting)   Temp 98.1 F (36.7 C)   Ht 5' 4.02 (1.626 m)   BMI 31.91 kg/m   Wt Readings from Last 3 Encounters:  07/18/24 186 lb (84.4 kg)  07/03/24 186 lb (84.4 kg)  06/05/24 186 lb 9.6 oz (84.6 kg)    Physical Exam Vitals and nursing note reviewed.  Constitutional:      General: She is not in acute distress.    Appearance: She is not ill-appearing.  HENT:     Head: Normocephalic.     Right  Ear: Hearing normal.     Left Ear: Hearing normal.     Nose: Nose normal.  Pulmonary:     Effort: Pulmonary effort is normal. No respiratory distress.  Neurological:     Mental Status: She is alert.  Psychiatric:        Mood and Affect: Mood normal.        Behavior: Behavior normal.        Thought Content: Thought content normal.        Judgment: Judgment normal.     Results for orders placed or performed during the hospital encounter of 07/17/24  Urine Culture Add-On   Collection Time: 07/03/24 10:51 AM   Specimen: Urine, Clean Catch  Result Value Ref Range   Specimen Description      URINE, CLEAN CATCH Performed at [facility], [address], [place], [place] [address]    Special Requests      NONE Performed at [facility], [address] [place], [place] [address]    Culture 50,000 COLONIES/mL ESCHERICHIA COLI (A)    Report Status 07/06/2024 FINAL    Organism ID, Bacteria ESCHERICHIA COLI (A)       Susceptibility   Escherichia coli - MIC*    AMPICILLIN <=2 SENSITIVE Sensitive     CEFAZOLIN  (URINE) Value in next row Sensitive      <=1 SENSITIVEThis is a modified FDA-approved test that has been validated and its performance characteristics determined by the reporting laboratory.  This laboratory is certified under the Clinical Laboratory Improvement Amendments CLIA as qualified to perform high complexity clinical laboratory testing.    CEFEPIME Value in next row Sensitive      <=1 SENSITIVEThis is a modified FDA-approved test that has been validated and its performance characteristics determined by the reporting laboratory.  This laboratory is certified under the Clinical Laboratory Improvement Amendments CLIA as qualified to perform high complexity clinical laboratory testing.    ERTAPENEM Value in next row Sensitive      <=1 SENSITIVEThis is a modified FDA-approved test that has been validated and its performance characteristics determined by the  reporting laboratory.  This laboratory is certified under the Clinical Laboratory Improvement Amendments CLIA as qualified to perform high complexity clinical laboratory testing.    CEFTRIAXONE Value in next row Sensitive      <=1 SENSITIVEThis is a modified FDA-approved test that has been validated and its performance characteristics determined by the reporting laboratory.  This laboratory is certified under the Clinical Laboratory Improvement Amendments CLIA as qualified to perform high complexity clinical laboratory testing.    CIPROFLOXACIN Value  in next row Sensitive      <=1 SENSITIVEThis is a modified FDA-approved test that has been validated and its performance characteristics determined by the reporting laboratory.  This laboratory is certified under the Clinical Laboratory Improvement Amendments CLIA as qualified to perform high complexity clinical laboratory testing.    GENTAMICIN Value in next row Sensitive      <=1 SENSITIVEThis is a modified FDA-approved test that has been validated and its performance characteristics determined by the reporting laboratory.  This laboratory is certified under the Clinical Laboratory Improvement Amendments CLIA as qualified to perform high complexity clinical laboratory testing.    NITROFURANTOIN  Value in next row Sensitive      <=1 SENSITIVEThis is a modified FDA-approved test that has been validated and its performance characteristics determined by the reporting laboratory.  This laboratory is certified under the Clinical Laboratory Improvement Amendments CLIA as qualified to perform high complexity clinical laboratory testing.    TRIMETH /SULFA  Value in next row Sensitive      <=1 SENSITIVEThis is a modified FDA-approved test that has been validated and its performance characteristics determined by the reporting laboratory.  This laboratory is certified under the Clinical Laboratory Improvement Amendments CLIA as qualified to perform high complexity clinical  laboratory testing.    AMPICILLIN/SULBACTAM Value in next row Sensitive      <=1 SENSITIVEThis is a modified FDA-approved test that has been validated and its performance characteristics determined by the reporting laboratory.  This laboratory is certified under the Clinical Laboratory Improvement Amendments CLIA as qualified to perform high complexity clinical laboratory testing.    PIP/TAZO Value in next row Sensitive      <=4 SENSITIVEThis is a modified FDA-approved test that has been validated and its performance characteristics determined by the reporting laboratory.  This laboratory is certified under the Clinical Laboratory Improvement Amendments CLIA as qualified to perform high complexity clinical laboratory testing.    MEROPENEM Value in next row Sensitive      <=4 SENSITIVEThis is a modified FDA-approved test that has been validated and its performance characteristics determined by the reporting laboratory.  This laboratory is certified under the Clinical Laboratory Improvement Amendments CLIA as qualified to perform high complexity clinical laboratory testing.    * 50,000 COLONIES/mL ESCHERICHIA COLI  CBC   Collection Time: 07/03/24  4:00 PM  Result Value Ref Range   WBC 9.3 4.0 - 10.5 K/uL   RBC 4.92 3.87 - 5.11 MIL/uL   Hemoglobin 14.4 12.0 - 15.0 g/dL   HCT 53.9 63.9 - 53.9 %   MCV 93.5 80.0 - 100.0 fL   MCH 29.3 26.0 - 34.0 pg   MCHC 31.3 30.0 - 36.0 g/dL   RDW 85.4 88.4 - 84.4 %   Platelets 269 150 - 400 K/uL   nRBC 0.0 0.0 - 0.2 %  CBC   Collection Time: 07/18/24  6:26 AM  Result Value Ref Range   WBC 13.2 (H) 4.0 - 10.5 K/uL   RBC 3.88 3.87 - 5.11 MIL/uL   Hemoglobin 11.4 (L) 12.0 - 15.0 g/dL   HCT 64.4 (L) 63.9 - 53.9 %   MCV 91.5 80.0 - 100.0 fL   MCH 29.4 26.0 - 34.0 pg   MCHC 32.1 30.0 - 36.0 g/dL   RDW 85.3 88.4 - 84.4 %   Platelets 220 150 - 400 K/uL   nRBC 0.0 0.0 - 0.2 %  Basic metabolic panel   Collection Time: 07/18/24  6:26 AM  Result Value Ref Range  Sodium 139 135 - 145 mmol/L   Potassium 3.9 3.5 - 5.1 mmol/L   Chloride 105 98 - 111 mmol/L   CO2 26 22 - 32 mmol/L   Glucose, Bld 114 (H) 70 - 99 mg/dL   BUN 14 8 - 23 mg/dL   Creatinine, Ser 9.37 0.44 - 1.00 mg/dL   Calcium  8.5 (L) 8.9 - 10.3 mg/dL   GFR, Estimated >39 >39 mL/min   Anion gap 9 5 - 15      Assessment & Plan:   Problem List Items Addressed This Visit       Respiratory   Chronic obstructive pulmonary disease, unspecified (HCC) - Primary   Chronic.  Has stopped smoking.  She is using Chantix  to help with her smoking cessation.      Relevant Medications   varenicline  (CHANTIX  CONTINUING MONTH PAK) 1 MG tablet     Other   Primary insomnia   Chronic. Controlled with Temazepam .  Patient aware of risks of taking medication and okay with continuing.  UDS and controlled substance agreement up to date as of January 2025.  Refills sent today.  Side effects and benefits of medication discussed during visit.  Follow up in 3 months.      Depression, recurrent   Chronic.  Controlled.  Will increase dose of Duloxetine  to 90mg  daily.  Refills sent today.  Return to clinic in 3 months for reevaluation.  Call sooner if concerns arise.       Relevant Medications   DULoxetine  (CYMBALTA ) 30 MG capsule      Follow up plan: Return in about 3 months (around 10/29/2024) for HTN, HLD, DM2 FU.   This visit was completed via MyChart due to the restrictions of the COVID-19 pandemic. All issues as above were discussed and addressed. Physical exam was done as above through visual confirmation on MyChart. If it was felt that the patient should be evaluated in the office, they were directed there. The patient verbally consented to this visit. Location of the patient: Home Location of the provider: Office Those involved with this call:  Provider: Darice Petty, NP CMA: Laymon Metro, CMA Front Desk/Registration: Claretta Maiden This encounter was conducted via video.  I spent 20  minutes  dedicated to the care of this patient on the date of this encounter to include previsit review of plan of care, follow up and refills, face to face time with the patient, and post visit ordering of testing.

## 2024-08-12 ENCOUNTER — Other Ambulatory Visit: Payer: Self-pay | Admitting: Nurse Practitioner

## 2024-08-15 NOTE — Telephone Encounter (Signed)
 Requested Prescriptions  Pending Prescriptions Disp Refills   amLODipine  (NORVASC ) 5 MG tablet [Pharmacy Med Name: AMLODIPINE  BESYLATE 5 MG TAB] 90 tablet 0    Sig: Take 1 tablet (5 mg total) by mouth daily.     Cardiovascular: Calcium  Channel Blockers 2 Passed - 08/15/2024 11:58 AM      Passed - Last BP in normal range    BP Readings from Last 1 Encounters:  07/31/24 117/65         Passed - Last Heart Rate in normal range    Pulse Readings from Last 1 Encounters:  07/18/24 76         Passed - Valid encounter within last 6 months    Recent Outpatient Visits           2 weeks ago Chronic obstructive pulmonary disease, unspecified COPD type (HCC)   [place] [facility] Melvin Pao, NP   2 months ago Elevated glucose   [facility] Melvin Pao, NP   3 months ago Essential hypertension   [place] [facility] Melvin Pao, NP   5 months ago Depression, recurrent   [place] [facility] Melvin Pao, NP   5 months ago Spinal stenosis, lumbar region, with neurogenic claudication    [facility] Melvin Pao, NP

## 2024-08-19 ENCOUNTER — Other Ambulatory Visit: Payer: Self-pay | Admitting: Nurse Practitioner

## 2024-08-26 ENCOUNTER — Other Ambulatory Visit: Payer: Self-pay | Admitting: Nurse Practitioner

## 2024-08-28 ENCOUNTER — Telehealth: Admitting: Nurse Practitioner

## 2024-08-28 ENCOUNTER — Other Ambulatory Visit

## 2024-08-28 ENCOUNTER — Other Ambulatory Visit: Payer: Self-pay | Admitting: Nurse Practitioner

## 2024-08-28 ENCOUNTER — Ambulatory Visit: Payer: Self-pay | Admitting: Nurse Practitioner

## 2024-08-28 ENCOUNTER — Encounter: Payer: Self-pay | Admitting: Nurse Practitioner

## 2024-08-28 VITALS — BP 116/68 | Ht 64.02 in | Wt 188.0 lb

## 2024-08-28 DIAGNOSIS — N3000 Acute cystitis without hematuria: Secondary | ICD-10-CM | POA: Diagnosis not present

## 2024-08-28 DIAGNOSIS — R300 Dysuria: Secondary | ICD-10-CM

## 2024-08-28 DIAGNOSIS — R8290 Unspecified abnormal findings in urine: Secondary | ICD-10-CM

## 2024-08-28 LAB — MICROSCOPIC EXAMINATION: RBC, Urine: NONE SEEN /HPF (ref 0–2)

## 2024-08-28 LAB — URINALYSIS, ROUTINE W REFLEX MICROSCOPIC
Bilirubin, UA: NEGATIVE
Glucose, UA: NEGATIVE
Ketones, UA: NEGATIVE
Nitrite, UA: POSITIVE — AB
Protein,UA: NEGATIVE
RBC, UA: NEGATIVE
Specific Gravity, UA: 1.025 (ref 1.005–1.030)
Urobilinogen, Ur: 4 mg/dL — ABNORMAL HIGH (ref 0.2–1.0)
pH, UA: 7 (ref 5.0–7.5)

## 2024-08-28 LAB — WET PREP FOR TRICH, YEAST, CLUE
Clue Cell Exam: NEGATIVE
Trichomonas Exam: NEGATIVE
Yeast Exam: NEGATIVE

## 2024-08-28 MED ORDER — NITROFURANTOIN MONOHYD MACRO 100 MG PO CAPS: 100.0000 mg | ORAL_CAPSULE | Freq: Two times a day (BID) | ORAL | 0 refills | Status: DC

## 2024-08-28 NOTE — Progress Notes (Signed)
 "  BP 116/68 (BP Location: Left Arm, Patient Position: Sitting)   Ht 5' 4.02 (1.626 m)   Wt 188 lb (85.3 kg)   BMI 32.25 kg/m    Subjective:    Patient ID: Cassandra Brandt, female    DOB: 02-20-1956, 68 y.o.   MRN: [id number]  HPI: Cassandra Brandt is a 68 y.o. female  Chief Complaint  Patient presents with   Urinary Tract Infection    Started Friday with lower back pain, frequency, odor, nausea.    Depression    Started a few weeks ago and does not feel the medicine is helping at this time.    RLS    Medication is not helping.    URINARY SYMPTOMS Symptoms started on Friday Dysuria: no Urinary frequency: yes Urgency: no Small volume voids: no Symptom severity: no Urinary incontinence: no Foul odor: no Hematuria: no Abdominal pain: no Back pain: no Suprapubic pain/pressure: yes Flank pain: no Fever:  no Vomiting: no Relief with cranberry juice: yes Relief with pyridium: no Status: stable Previous urinary tract infection: yes Recurrent urinary tract infection: no Sexual activity: No sexually active/monogomous/practicing safe sex History of sexually transmitted disease: no Penile discharge: no Treatments attempted: cranberry and increasing fluids   Relevant past medical, surgical, family and social history reviewed and updated as indicated. Interim medical history since our last visit reviewed. Allergies and medications reviewed and updated.  Review of Systems  Constitutional:  Negative for fever.  Gastrointestinal:  Negative for abdominal pain and vomiting.  Genitourinary:  Positive for dysuria. Negative for decreased urine volume, flank pain, frequency, hematuria and urgency.  Musculoskeletal:  Negative for back pain.    Per HPI unless specifically indicated above     Objective:    BP 116/68 (BP Location: Left Arm, Patient Position: Sitting)   Ht 5' 4.02 (1.626 m)   Wt 188 lb (85.3 kg)   BMI 32.25 kg/m   Wt Readings from Last 3 Encounters:  08/28/24 188 lb  (85.3 kg)  07/18/24 186 lb (84.4 kg)  07/03/24 186 lb (84.4 kg)    Physical Exam Vitals and nursing note reviewed.  HENT:     Head: Normocephalic.     Right Ear: Hearing normal.     Left Ear: Hearing normal.     Nose: Nose normal.  Eyes:     Pupils: Pupils are equal, round, and reactive to light.  Pulmonary:     Effort: Pulmonary effort is normal. No respiratory distress.  Neurological:     Mental Status: She is alert.  Psychiatric:        Mood and Affect: Mood normal.        Behavior: Behavior normal.        Thought Content: Thought content normal.        Judgment: Judgment normal.     Results for orders placed or performed in visit on 08/28/24  WET PREP FOR TRICH, YEAST, CLUE   Collection Time: 08/28/24  9:05 AM   Specimen: Sterile Swab   Sterile Swab  Result Value Ref Range   Trichomonas Exam Negative Negative   Yeast Exam Negative Negative   Clue Cell Exam Negative Negative  Microscopic Examination   Collection Time: 08/28/24  9:36 AM   Urine  Result Value Ref Range   WBC, UA 6-10 (A) 0 - 5 /hpf   RBC, Urine None seen 0 - 2 /hpf   Epithelial Cells (non renal) 0-10 0 - 10 /hpf   Crystals Present (  A) N/A   Crystal Type Amorphous Sediment N/A   Bacteria, UA Many (A) None seen/Few  Urinalysis, Routine w reflex microscopic   Collection Time: 08/28/24  9:36 AM  Result Value Ref Range   Specific Gravity, UA 1.025 1.005 - 1.030   pH, UA 7.0 5.0 - 7.5   Color, UA Yellow Yellow   Appearance Ur Cloudy (A) Clear   Leukocytes,UA 1+ (A) Negative   Protein,UA Negative Negative/Trace   Glucose, UA Negative Negative   Ketones, UA Negative Negative   RBC, UA Negative Negative   Bilirubin, UA Negative Negative   Urobilinogen, Ur 4.0 (H) 0.2 - 1.0 mg/dL   Nitrite, UA Positive (A) Negative   Microscopic Examination See below:       Assessment & Plan:   Problem List Items Addressed This Visit   None Visit Diagnoses       Acute cystitis without hematuria    -  Primary    will treat with Macrobid .  Complete course of medication.  Increase water intake.  Follow up if not improved.  Urine sent for culture.        Follow up plan: No follow-ups on file.   This visit was completed via MyChart due to the restrictions of the COVID-19 pandemic. All issues as above were discussed and addressed. Physical exam was done as above through visual confirmation on MyChart. If it was felt that the patient should be evaluated in the office, they were directed there. The patient verbally consented to this visit. Location of the patient: Home Location of the provider: Office Those involved with this call:  Provider: Darice Petty, NP CMA: Izetta Sarah, CMA Front Desk/Registration: Claretta Maiden This encounter was conducted via video.  I spent 20 minutes  dedicated to the care of this patient on the date of this encounter to include previsit review of plan of care and follow up, face to face time with the patient, and post visit ordering of testing.      "

## 2024-08-28 NOTE — Telephone Encounter (Signed)
 Scheduled

## 2024-08-31 LAB — URINE CULTURE

## 2024-09-01 ENCOUNTER — Encounter: Payer: Self-pay | Admitting: Nurse Practitioner

## 2024-09-01 ENCOUNTER — Telehealth: Admitting: Nurse Practitioner

## 2024-09-01 VITALS — BP 135/80 | HR 103 | Temp 98.4°F | Ht 64.02 in | Wt 191.0 lb

## 2024-09-01 DIAGNOSIS — F339 Major depressive disorder, recurrent, unspecified: Secondary | ICD-10-CM | POA: Diagnosis not present

## 2024-09-01 MED ORDER — DULOXETINE HCL 60 MG PO CPEP: 120.0000 mg | ORAL_CAPSULE | Freq: Every day | ORAL | 1 refills | Status: AC

## 2024-09-01 NOTE — Assessment & Plan Note (Signed)
 Chronic. Not well controlled.  Will increase Duloxetine  to 120mg  daily.  If not improved with medication increase, can consider adding on Wellbutrin to help with symptoms.  Follow up in 5 days.  Call sooner if concerns arise.

## 2024-09-01 NOTE — Progress Notes (Signed)
 "  BP 135/80 (BP Location: Left Arm)   Pulse (!) 103   Temp 98.4 F (36.9 C)   Ht 5' 4.02 (1.626 m)   Wt 191 lb (86.6 kg)   BMI 32.77 kg/m    Subjective:    Patient ID: Cassandra Brandt, female    DOB: June 20, 1956, 69 y.o.   MRN: [id number]  HPI: Cassandra Brandt is a 69 y.o. female  Chief Complaint  Patient presents with   Depression   DEPRESSION Patient states she hasn't been doing great.  She is at the point where she just doesn't care.   She feels like she is back to where she was before she was even started on the Duloxetine .  She feels like the surgery has pushed on her mentally and emotionally.  She has no motivation to do anything.  Feels like her RLS is worse as well.  Not feeling relief from the pregabalin .   Relevant past medical, surgical, family and social history reviewed and updated as indicated. Interim medical history since our last visit reviewed. Allergies and medications reviewed and updated.  Review of Systems  Neurological:        RLS  Psychiatric/Behavioral:  Positive for dysphoric mood. Negative for suicidal ideas. The patient is nervous/anxious.     Per HPI unless specifically indicated above     Objective:    BP 135/80 (BP Location: Left Arm)   Pulse (!) 103   Temp 98.4 F (36.9 C)   Ht 5' 4.02 (1.626 m)   Wt 191 lb (86.6 kg)   BMI 32.77 kg/m   Wt Readings from Last 3 Encounters:  09/01/24 191 lb (86.6 kg)  08/28/24 188 lb (85.3 kg)  07/18/24 186 lb (84.4 kg)    Physical Exam Vitals and nursing note reviewed.  HENT:     Head: Normocephalic.     Right Ear: Hearing normal.     Left Ear: Hearing normal.     Nose: Nose normal.  Eyes:     Pupils: Pupils are equal, round, and reactive to light.  Pulmonary:     Effort: Pulmonary effort is normal. No respiratory distress.  Neurological:     Mental Status: She is alert.  Psychiatric:        Mood and Affect: Mood normal.        Behavior: Behavior normal.        Thought Content: Thought content  normal.        Judgment: Judgment normal.     Results for orders placed or performed in visit on 08/28/24  WET PREP FOR TRICH, YEAST, CLUE   Collection Time: 08/28/24  9:05 AM   Specimen: Sterile Swab   Sterile Swab  Result Value Ref Range   Trichomonas Exam Negative Negative   Yeast Exam Negative Negative   Clue Cell Exam Negative Negative  Urine Culture   Collection Time: 08/28/24  9:36 AM   Specimen: Urine   UR  Result Value Ref Range   Urine Culture, Routine Final report (A)    Organism ID, Bacteria Escherichia coli (A)    Antimicrobial Susceptibility Comment   Microscopic Examination   Collection Time: 08/28/24  9:36 AM   Urine  Result Value Ref Range   WBC, UA 6-10 (A) 0 - 5 /hpf   RBC, Urine None seen 0 - 2 /hpf   Epithelial Cells (non renal) 0-10 0 - 10 /hpf   Crystals Present (A) N/A   Crystal Type Amorphous Sediment N/A  Bacteria, UA Many (A) None seen/Few  Urinalysis, Routine w reflex microscopic   Collection Time: 08/28/24  9:36 AM  Result Value Ref Range   Specific Gravity, UA 1.025 1.005 - 1.030   pH, UA 7.0 5.0 - 7.5   Color, UA Yellow Yellow   Appearance Ur Cloudy (A) Clear   Leukocytes,UA 1+ (A) Negative   Protein,UA Negative Negative/Trace   Glucose, UA Negative Negative   Ketones, UA Negative Negative   RBC, UA Negative Negative   Bilirubin, UA Negative Negative   Urobilinogen, Ur 4.0 (H) 0.2 - 1.0 mg/dL   Nitrite, UA Positive (A) Negative   Microscopic Examination See below:       Assessment & Plan:   Problem List Items Addressed This Visit       Other   Depression, recurrent - Primary   Chronic. Not well controlled.  Will increase Duloxetine  to 120mg  daily.  If not improved with medication increase, can consider adding on Wellbutrin to help with symptoms.  Follow up in 5 days.  Call sooner if concerns arise.      Relevant Medications   DULoxetine  (CYMBALTA ) 60 MG capsule     Follow up plan: No follow-ups on file.  This visit was  completed via MyChart due to the restrictions of the COVID-19 pandemic. All issues as above were discussed and addressed. Physical exam was done as above through visual confirmation on MyChart. If it was felt that the patient should be evaluated in the office, they were directed there. The patient verbally consented to this visit. Location of the patient: Home Location of the provider: Office Those involved with this call:  Provider: Darice Petty, NP CMA: Joya Louder, CMA Front Desk/Registration: Claretta Maiden This encounter was conducted via video.  I spent 30 minutes dedicated to the care of this patient on the date of this encounter to include previsit review of plan of care, medications and follow up, face to face time with the patient, and post visit ordering of testing.       "

## 2024-09-06 ENCOUNTER — Ambulatory Visit: Admitting: Nurse Practitioner

## 2024-09-06 ENCOUNTER — Encounter: Payer: Self-pay | Admitting: Nurse Practitioner

## 2024-09-06 ENCOUNTER — Ambulatory Visit

## 2024-09-06 VITALS — BP 132/81 | HR 74 | Temp 98.2°F | Ht 64.02 in | Wt 190.8 lb

## 2024-09-06 DIAGNOSIS — R000 Tachycardia, unspecified: Secondary | ICD-10-CM

## 2024-09-06 DIAGNOSIS — F322 Major depressive disorder, single episode, severe without psychotic features: Secondary | ICD-10-CM | POA: Insufficient documentation

## 2024-09-06 DIAGNOSIS — G2581 Restless legs syndrome: Secondary | ICD-10-CM | POA: Diagnosis not present

## 2024-09-06 MED ORDER — ROPINIROLE HCL 0.5 MG PO TABS: 0.5000 mg | ORAL_TABLET | Freq: Every day | ORAL | 0 refills | Status: AC

## 2024-09-06 NOTE — Progress Notes (Signed)
 "  BP 132/81 (BP Location: Right Arm, Patient Position: Sitting, Cuff Size: Normal)   Pulse 74   Temp 98.2 F (36.8 C) (Oral)   Ht 5' 4.02 (1.626 m)   Wt 190 lb 12.8 oz (86.5 kg)   SpO2 95%   BMI 32.73 kg/m    Subjective:    Patient ID: Cassandra Brandt, female    DOB: [birth year], 69 y.o.   MRN: [id number]  HPI: Cassandra Brandt is a 69 y.o. female  Chief Complaint  Patient presents with   Depression    Patient documented her BP: left arm 09/02/24: 113/75 Pulse: 103 09/03/24:128/80 Pulse: 88 09/04/24: 133/84 Pulse: 96 09/05/24: 103/66 Pulse: 109   DEPRESSION Patient states her mood has been better since increasing her dose of duloxetine .  States her RLS has not improved with the increase in duloxetine .  She wakes up 3-4 times per night due to feeling like something is crawling on her.  She does not feel like she needs the Pregablin any longer after the hip surgery.   Patient states her HR has been running higher. HR is running 88-109.   Denies HA, CP, SOB, dizziness, palpitations, visual changes, and lower extremity swelling.  RESTLESS LEGS Duration: years Discomfort description:  crawling Pain: no Location: lower legs Bilateral: yes Symmetric: yes Severity: severe Onset:  sudden Frequency:  at night Symptoms only occur while legs at rest: yes Sudden unintentional leg jerking: no Bed partner bothered by leg movements: no LE numbness: no Decreased sensation: yes Weakness: yes Insomnia: yes Daytime somnolence: no Fatigue: yes     Relevant past medical, surgical, family and social history reviewed and updated as indicated. Interim medical history since our last visit reviewed. Allergies and medications reviewed and updated.  Review of Systems  Neurological:        RLS  Psychiatric/Behavioral:  Positive for dysphoric mood. Negative for suicidal ideas. The patient is nervous/anxious.     Per HPI unless specifically indicated above     Objective:    BP 132/81 (BP  Location: Right Arm, Patient Position: Sitting, Cuff Size: Normal)   Pulse 74   Temp 98.2 F (36.8 C) (Oral)   Ht 5' 4.02 (1.626 m)   Wt 190 lb 12.8 oz (86.5 kg)   SpO2 95%   BMI 32.73 kg/m   Wt Readings from Last 3 Encounters:  09/06/24 190 lb 12.8 oz (86.5 kg)  09/01/24 191 lb (86.6 kg)  08/28/24 188 lb (85.3 kg)    Physical Exam Vitals and nursing note reviewed.  Constitutional:      General: She is not in acute distress.    Appearance: Normal appearance. She is normal weight. She is not ill-appearing, toxic-appearing or diaphoretic.  HENT:     Head: Normocephalic.     Right Ear: External ear normal.     Left Ear: External ear normal.     Nose: Nose normal.     Mouth/Throat:     Mouth: Mucous membranes are moist.     Pharynx: Oropharynx is clear.  Eyes:     General:        Right eye: No discharge.        Left eye: No discharge.     Extraocular Movements: Extraocular movements intact.     Conjunctiva/sclera: Conjunctivae normal.     Pupils: Pupils are equal, round, and reactive to light.  Cardiovascular:     Rate and Rhythm: Normal rate and regular rhythm.     Heart sounds:  No murmur heard. Pulmonary:     Effort: Pulmonary effort is normal. No respiratory distress.     Breath sounds: Normal breath sounds. No wheezing or rales.  Musculoskeletal:     Cervical back: Normal range of motion and neck supple.  Skin:    General: Skin is warm and dry.     Capillary Refill: Capillary refill takes less than 2 seconds.  Neurological:     General: No focal deficit present.     Mental Status: She is alert and oriented to person, place, and time. Mental status is at baseline.  Psychiatric:        Mood and Affect: Mood normal.        Behavior: Behavior normal.        Thought Content: Thought content normal.        Judgment: Judgment normal.     Results for orders placed or performed in visit on 08/28/24  WET PREP FOR TRICH, YEAST, CLUE   Collection Time: 08/28/24  9:05 AM    Specimen: Sterile Swab   Sterile Swab  Result Value Ref Range   Trichomonas Exam Negative Negative   Yeast Exam Negative Negative   Clue Cell Exam Negative Negative  Urine Culture   Collection Time: 08/28/24  9:36 AM   Specimen: Urine   UR  Result Value Ref Range   Urine Culture, Routine Final report (A)    Organism ID, Bacteria Escherichia coli (A)    Antimicrobial Susceptibility Comment   Microscopic Examination   Collection Time: 08/28/24  9:36 AM   Urine  Result Value Ref Range   WBC, UA 6-10 (A) 0 - 5 /hpf   RBC, Urine None seen 0 - 2 /hpf   Epithelial Cells (non renal) 0-10 0 - 10 /hpf   Crystals Present (A) N/A   Crystal Type Amorphous Sediment N/A   Bacteria, UA Many (A) None seen/Few  Urinalysis, Routine w reflex microscopic   Collection Time: 08/28/24  9:36 AM  Result Value Ref Range   Specific Gravity, UA 1.025 1.005 - 1.030   pH, UA 7.0 5.0 - 7.5   Color, UA Yellow Yellow   Appearance Ur Cloudy (A) Clear   Leukocytes,UA 1+ (A) Negative   Protein,UA Negative Negative/Trace   Glucose, UA Negative Negative   Ketones, UA Negative Negative   RBC, UA Negative Negative   Bilirubin, UA Negative Negative   Urobilinogen, Ur 4.0 (H) 0.2 - 1.0 mg/dL   Nitrite, UA Positive (A) Negative   Microscopic Examination See below:       Assessment & Plan:   Problem List Items Addressed This Visit       Other   RLS (restless legs syndrome)   Ongoing problem.  Will start Requip  0.5mg  nightly.  Side effects and benefits of medication discussed.  Follow up in 2 months.  Call sooner if concerns arise.      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  Improved with increased dose of Duloxetine .  Continue with current regimen.  Follow up in 2 months.  Call sooner if concerns arise.       Other Visit Diagnoses       Tachycardia       New concern. HR 88-109 at home. Normal rate and rhythm in office. Will order Zio monitor for evaluation. Will follow up  once results are back.   Relevant Orders   LONG TERM MONITOR (3-14 DAYS)  Follow up plan: Return for Keep appt for end of March.  This visit was completed via MyChart due to the restrictions of the COVID-19 pandemic. All issues as above were discussed and addressed. Physical exam was done as above through visual confirmation on MyChart. If it was felt that the patient should be evaluated in the office, they were directed there. The patient verbally consented to this visit. Location of the patient: Home Location of the provider: Office Those involved with this call:  Provider: Darice Petty, NP CMA: Joya Louder, CMA Front Desk/Registration: Claretta Maiden This encounter was conducted via video.  I spent 30 minutes dedicated to the care of this patient on the date of this encounter to include previsit review of plan of care, medications and follow up, face to face time with the patient, and post visit ordering of testing.       "

## 2024-09-06 NOTE — Assessment & Plan Note (Signed)
 Chronic.  Improved with increased dose of Duloxetine .  Continue with current regimen.  Follow up in 2 months.  Call sooner if concerns arise.

## 2024-09-06 NOTE — Assessment & Plan Note (Signed)
 Ongoing problem.  Will start Requip  0.5mg  nightly.  Side effects and benefits of medication discussed.  Follow up in 2 months.  Call sooner if concerns arise.

## 2024-09-27 ENCOUNTER — Other Ambulatory Visit: Payer: Self-pay | Admitting: Nurse Practitioner

## 2024-09-28 NOTE — Telephone Encounter (Signed)
 Requested Prescriptions  Pending Prescriptions Disp Refills   rosuvastatin  (CRESTOR ) 10 MG tablet [Pharmacy Med Name: ROSUVASTATIN  CALCIUM  10 MG TAB] 90 tablet 0    Sig: Take 1 tablet (10 mg total) by mouth daily.     Cardiovascular:  Antilipid - Statins 2 Failed - 09/28/2024  2:04 PM      Failed - Lipid Panel in normal range within the last 12 months    Cholesterol, Total  Date Value Ref Range Status  04/26/2024 113 100 - 199 mg/dL Final   LDL Chol Calc (NIH)  Date Value Ref Range Status  04/26/2024 52 0 - 99 mg/dL Final   HDL  Date Value Ref Range Status  04/26/2024 45 >39 mg/dL Final   Triglycerides  Date Value Ref Range Status  04/26/2024 76 0 - 149 mg/dL Final         Passed - Cr in normal range and within 360 days    Creatinine  Date Value Ref Range Status  09/23/2023 134.4 20.0 - 300.0 mg/dL Final   Creatinine, Ser  Date Value Ref Range Status  07/18/2024 0.62 0.44 - 1.00 mg/dL Final         Passed - Patient is not pregnant      Passed - Valid encounter within last 12 months    Recent Outpatient Visits           3 weeks ago Moderately severe major depression (HCC)   [place] [facility] Melvin Pao, NP   3 weeks ago Depression, recurrent   [place] [facility] Melvin Pao, NP   1 month ago Acute cystitis without hematuria   [place] [facility] Melvin Pao, NP   1 month ago Chronic obstructive pulmonary disease, unspecified COPD type [facility])   [place] [facility] Melvin Pao, NP   3 months ago Elevated glucose   [place] [facility] Melvin Pao, NP

## 2024-10-05 ENCOUNTER — Encounter: Payer: Self-pay | Admitting: Nurse Practitioner

## 2024-10-06 ENCOUNTER — Ambulatory Visit: Payer: Self-pay

## 2024-10-06 NOTE — Telephone Encounter (Signed)
 FYI Only or Action Required?: FYI only for provider: appointment scheduled on 2/9.  Patient was last seen in primary care on 09/06/2024 by Melvin Pao, NP.  Called Nurse Triage reporting Sore Throat and Nasal Congestion.  Symptoms began several days ago.  Interventions attempted: OTC medications: Mucinex , tylenol .  Symptoms are: gradually worsening.  Triage Disposition: See PCP When Office is Open (Within 3 Days)  Patient/caregiver understands and will follow disposition?: Yes    Wednesday onset of sore throat, difficulty swallowing, post nasal drip and chest congestion. Temp 99.6 F temporal today. No SOB or CP. No white spots on throat. Still able to swallow liquids and staying hydrated. Appt scheduled with PCP on 2/9. Advised UC or ED for worsening symptoms.     Message from Victoria B sent at 10/06/2024  9:28 AM EST  Reason for Triage:  patient states, feels like a knot in back of throat, sob, trouble swallowing,and can't taste   Reason for Disposition  [1] Sore throat with cough/cold symptoms AND [2] present > 5 days    Since Wednesday  Answer Assessment - Initial Assessment Questions 1. ONSET: When did the throat start hurting? (Hours or days ago)      Wednesday  2. SEVERITY: How bad is the sore throat? (Scale 1-10; mild, moderate or severe)     6/10  3. STREP EXPOSURE: Has there been any exposure to strep within the past week? If Yes, ask: What type of contact occurred?      No  4.  VIRAL SYMPTOMS: Are there any symptoms of a cold, such as a runny nose, cough, hoarse voice or red eyes?      Chest and nasal congestion, post nasal drip  5. FEVER: Do you have a fever? If Yes, ask: What is your temperature, how was it measured, and when did it start?     9.6 F temporal today  6. PUS ON THE TONSILS: Is there pus on the tonsils in the back of your throat?     No  7. OTHER SYMPTOMS: Do you have any other symptoms? (e.g., difficulty breathing,  headache, rash)     No  Protocols used: Sore Throat-A-AH

## 2024-10-09 ENCOUNTER — Ambulatory Visit: Admitting: Nurse Practitioner

## 2024-10-30 ENCOUNTER — Ambulatory Visit: Admitting: Nurse Practitioner

## 2024-11-21 ENCOUNTER — Ambulatory Visit

## 2024-12-06 ENCOUNTER — Ambulatory Visit
